# Patient Record
Sex: Female | Born: 1970 | Race: White | Hispanic: No | Marital: Married | Smoking: Former smoker
Health system: Southern US, Community
[De-identification: ages and names within clinical notes are randomized; demographics above are authoritative.]

## PROBLEM LIST (undated history)

## (undated) DIAGNOSIS — E785 Hyperlipidemia, unspecified: Secondary | ICD-10-CM

## (undated) DIAGNOSIS — E119 Type 2 diabetes mellitus without complications: Secondary | ICD-10-CM

## (undated) DIAGNOSIS — T7840XA Allergy, unspecified, initial encounter: Secondary | ICD-10-CM

## (undated) DIAGNOSIS — M1990 Unspecified osteoarthritis, unspecified site: Secondary | ICD-10-CM

## (undated) DIAGNOSIS — G4730 Sleep apnea, unspecified: Secondary | ICD-10-CM

## (undated) DIAGNOSIS — E669 Obesity, unspecified: Secondary | ICD-10-CM

## (undated) DIAGNOSIS — G4733 Obstructive sleep apnea (adult) (pediatric): Secondary | ICD-10-CM

## (undated) DIAGNOSIS — F419 Anxiety disorder, unspecified: Secondary | ICD-10-CM

## (undated) DIAGNOSIS — J45909 Unspecified asthma, uncomplicated: Secondary | ICD-10-CM

## (undated) DIAGNOSIS — N83209 Unspecified ovarian cyst, unspecified side: Secondary | ICD-10-CM

## (undated) DIAGNOSIS — I10 Essential (primary) hypertension: Secondary | ICD-10-CM

## (undated) HISTORY — DX: Allergy, unspecified, initial encounter: T78.40XA

## (undated) HISTORY — DX: Hyperlipidemia, unspecified: E78.5

## (undated) HISTORY — PX: TUBAL LIGATION: SHX77

## (undated) HISTORY — DX: Obesity, unspecified: E66.9

## (undated) HISTORY — DX: Unspecified osteoarthritis, unspecified site: M19.90

## (undated) HISTORY — DX: Essential (primary) hypertension: I10

## (undated) HISTORY — PX: ABDOMINAL HYSTERECTOMY: SHX81

## (undated) HISTORY — DX: Unspecified asthma, uncomplicated: J45.909

## (undated) HISTORY — PX: BREAST EXCISIONAL BIOPSY: SUR124

## (undated) HISTORY — DX: Sleep apnea, unspecified: G47.30

## (undated) HISTORY — PX: OTHER SURGICAL HISTORY: SHX169

## (undated) HISTORY — PX: BREAST SURGERY: SHX581

## (undated) HISTORY — PX: NASAL SINUS SURGERY: SHX719

## (undated) HISTORY — DX: Obstructive sleep apnea (adult) (pediatric): G47.33

## (undated) HISTORY — DX: Unspecified ovarian cyst, unspecified side: N83.209

## (undated) HISTORY — DX: Anxiety disorder, unspecified: F41.9

## (undated) HISTORY — PX: CHOLECYSTECTOMY: SHX55

---

## 1997-09-16 ENCOUNTER — Ambulatory Visit (HOSPITAL_COMMUNITY): Admission: RE | Admit: 1997-09-16 | Discharge: 1997-09-16 | Payer: Self-pay | Admitting: Obstetrics and Gynecology

## 1997-10-12 ENCOUNTER — Other Ambulatory Visit: Admission: RE | Admit: 1997-10-12 | Discharge: 1997-10-12 | Payer: Self-pay | Admitting: Obstetrics and Gynecology

## 1998-02-24 ENCOUNTER — Inpatient Hospital Stay (HOSPITAL_COMMUNITY): Admission: RE | Admit: 1998-02-24 | Discharge: 1998-02-25 | Payer: Self-pay | Admitting: Obstetrics and Gynecology

## 1998-09-14 ENCOUNTER — Other Ambulatory Visit: Admission: RE | Admit: 1998-09-14 | Discharge: 1998-09-14 | Payer: Self-pay | Admitting: Obstetrics and Gynecology

## 2001-03-03 ENCOUNTER — Other Ambulatory Visit: Admission: RE | Admit: 2001-03-03 | Discharge: 2001-03-03 | Payer: Self-pay | Admitting: Gynecology

## 2003-02-24 ENCOUNTER — Emergency Department (HOSPITAL_COMMUNITY): Admission: EM | Admit: 2003-02-24 | Discharge: 2003-02-24 | Payer: Self-pay | Admitting: Emergency Medicine

## 2004-06-11 ENCOUNTER — Emergency Department (HOSPITAL_COMMUNITY): Admission: EM | Admit: 2004-06-11 | Discharge: 2004-06-11 | Payer: Self-pay | Admitting: Family Medicine

## 2006-07-11 ENCOUNTER — Emergency Department (HOSPITAL_COMMUNITY): Admission: EM | Admit: 2006-07-11 | Discharge: 2006-07-11 | Payer: Self-pay | Admitting: Family Medicine

## 2008-07-29 ENCOUNTER — Ambulatory Visit: Payer: Self-pay | Admitting: Family Medicine

## 2008-07-29 DIAGNOSIS — K219 Gastro-esophageal reflux disease without esophagitis: Secondary | ICD-10-CM | POA: Insufficient documentation

## 2008-07-29 DIAGNOSIS — J309 Allergic rhinitis, unspecified: Secondary | ICD-10-CM | POA: Insufficient documentation

## 2008-07-29 DIAGNOSIS — J4520 Mild intermittent asthma, uncomplicated: Secondary | ICD-10-CM | POA: Insufficient documentation

## 2008-07-29 DIAGNOSIS — M2560 Stiffness of unspecified joint, not elsewhere classified: Secondary | ICD-10-CM | POA: Insufficient documentation

## 2008-07-29 DIAGNOSIS — D509 Iron deficiency anemia, unspecified: Secondary | ICD-10-CM | POA: Insufficient documentation

## 2008-08-02 ENCOUNTER — Telehealth: Payer: Self-pay | Admitting: Family Medicine

## 2008-08-12 ENCOUNTER — Ambulatory Visit: Payer: Self-pay | Admitting: Family Medicine

## 2008-08-12 DIAGNOSIS — E7800 Pure hypercholesterolemia, unspecified: Secondary | ICD-10-CM | POA: Insufficient documentation

## 2008-08-12 LAB — CONVERTED CEMR LAB: Anti Nuclear Antibody(ANA): NEGATIVE

## 2008-08-17 LAB — CONVERTED CEMR LAB
AST: 29 units/L (ref 0–37)
Albumin: 3.6 g/dL (ref 3.5–5.2)
Alkaline Phosphatase: 97 units/L (ref 39–117)
BUN: 8 mg/dL (ref 6–23)
Bilirubin, Direct: 0.1 mg/dL (ref 0.0–0.3)
Chloride: 108 meq/L (ref 96–112)
Cholesterol: 166 mg/dL (ref 0–200)
GFR calc non Af Amer: 100 mL/min
Glucose, Bld: 91 mg/dL (ref 70–99)
LDL Cholesterol: 109 mg/dL — ABNORMAL HIGH (ref 0–99)
Potassium: 4.1 meq/L (ref 3.5–5.1)
Sodium: 143 meq/L (ref 135–145)
TSH: 0.91 microintl units/mL (ref 0.35–5.50)

## 2009-03-13 ENCOUNTER — Emergency Department (HOSPITAL_COMMUNITY): Admission: EM | Admit: 2009-03-13 | Discharge: 2009-03-13 | Payer: Self-pay | Admitting: Family Medicine

## 2010-07-24 ENCOUNTER — Inpatient Hospital Stay (INDEPENDENT_AMBULATORY_CARE_PROVIDER_SITE_OTHER)
Admission: RE | Admit: 2010-07-24 | Discharge: 2010-07-24 | Disposition: A | Discharge status: Home / Self Care | Payer: Self-pay | Source: Ambulatory Visit | Attending: Emergency Medicine | Admitting: Emergency Medicine

## 2010-07-24 DIAGNOSIS — S8000XA Contusion of unspecified knee, initial encounter: Secondary | ICD-10-CM

## 2010-07-24 DIAGNOSIS — S335XXA Sprain of ligaments of lumbar spine, initial encounter: Secondary | ICD-10-CM

## 2010-07-24 LAB — POCT URINALYSIS DIPSTICK
Bilirubin Urine: NEGATIVE
Ketones, ur: NEGATIVE mg/dL
Protein, ur: NEGATIVE mg/dL
Specific Gravity, Urine: 1.015 (ref 1.005–1.030)

## 2012-04-10 ENCOUNTER — Other Ambulatory Visit: Payer: Self-pay | Admitting: Nurse Practitioner

## 2012-04-10 DIAGNOSIS — Z1231 Encounter for screening mammogram for malignant neoplasm of breast: Secondary | ICD-10-CM

## 2012-05-20 ENCOUNTER — Ambulatory Visit: Payer: Medicaid Other

## 2012-05-26 ENCOUNTER — Ambulatory Visit
Admission: RE | Admit: 2012-05-26 | Discharge: 2012-05-26 | Disposition: A | Discharge status: Home / Self Care | Payer: No Typology Code available for payment source | Source: Ambulatory Visit | Attending: Nurse Practitioner | Admitting: Nurse Practitioner

## 2012-05-26 DIAGNOSIS — Z1231 Encounter for screening mammogram for malignant neoplasm of breast: Secondary | ICD-10-CM

## 2012-06-02 ENCOUNTER — Other Ambulatory Visit: Payer: Self-pay | Admitting: Nurse Practitioner

## 2012-06-02 DIAGNOSIS — R928 Other abnormal and inconclusive findings on diagnostic imaging of breast: Secondary | ICD-10-CM

## 2012-06-19 ENCOUNTER — Ambulatory Visit
Admission: RE | Admit: 2012-06-19 | Discharge: 2012-06-19 | Disposition: A | Discharge status: Home / Self Care | Payer: Self-pay | Source: Ambulatory Visit | Attending: Nurse Practitioner | Admitting: Nurse Practitioner

## 2012-06-19 DIAGNOSIS — R928 Other abnormal and inconclusive findings on diagnostic imaging of breast: Secondary | ICD-10-CM

## 2012-12-02 ENCOUNTER — Ambulatory Visit (HOSPITAL_BASED_OUTPATIENT_CLINIC_OR_DEPARTMENT_OTHER)
Admission: RE | Admit: 2012-12-02 | Discharge: 2012-12-02 | Disposition: A | Discharge status: Home / Self Care | Payer: BC Managed Care – PPO | Source: Ambulatory Visit | Attending: Internal Medicine | Admitting: Internal Medicine

## 2012-12-02 ENCOUNTER — Ambulatory Visit (INDEPENDENT_AMBULATORY_CARE_PROVIDER_SITE_OTHER): Payer: BC Managed Care – PPO | Admitting: Internal Medicine

## 2012-12-02 ENCOUNTER — Encounter: Payer: Self-pay | Admitting: Internal Medicine

## 2012-12-02 VITALS — BP 128/88 | HR 94 | Temp 98.9°F | Resp 18 | Ht 63.0 in | Wt 268.0 lb

## 2012-12-02 DIAGNOSIS — M25559 Pain in unspecified hip: Secondary | ICD-10-CM | POA: Insufficient documentation

## 2012-12-02 DIAGNOSIS — R42 Dizziness and giddiness: Secondary | ICD-10-CM | POA: Insufficient documentation

## 2012-12-02 DIAGNOSIS — G4733 Obstructive sleep apnea (adult) (pediatric): Secondary | ICD-10-CM

## 2012-12-02 DIAGNOSIS — Z90710 Acquired absence of both cervix and uterus: Secondary | ICD-10-CM | POA: Insufficient documentation

## 2012-12-02 LAB — CBC WITH DIFFERENTIAL/PLATELET
Basophils Absolute: 0 10*3/uL (ref 0.0–0.1)
Eosinophils Relative: 2 % (ref 0–5)
HCT: 40.1 % (ref 36.0–46.0)
Hemoglobin: 13.5 g/dL (ref 12.0–15.0)
Lymphocytes Relative: 34 % (ref 12–46)
Lymphs Abs: 2.7 10*3/uL (ref 0.7–4.0)
MCV: 87.2 fL (ref 78.0–100.0)
Monocytes Absolute: 0.5 10*3/uL (ref 0.1–1.0)
Monocytes Relative: 7 % (ref 3–12)
Neutro Abs: 4.5 10*3/uL (ref 1.7–7.7)
RBC: 4.6 MIL/uL (ref 3.87–5.11)
RDW: 14.3 % (ref 11.5–15.5)
WBC: 7.9 10*3/uL (ref 4.0–10.5)

## 2012-12-02 LAB — COMPREHENSIVE METABOLIC PANEL
AST: 26 U/L (ref 0–37)
Albumin: 4.4 g/dL (ref 3.5–5.2)
BUN: 11 mg/dL (ref 6–23)
CO2: 25 mEq/L (ref 19–32)
Calcium: 9 mg/dL (ref 8.4–10.5)
Chloride: 104 mEq/L (ref 96–112)
Creat: 0.74 mg/dL (ref 0.50–1.10)
Glucose, Bld: 80 mg/dL (ref 70–99)
Potassium: 4.2 mEq/L (ref 3.5–5.3)

## 2012-12-02 LAB — T4, FREE: Free T4: 1.02 ng/dL (ref 0.80–1.80)

## 2012-12-02 LAB — LIPID PANEL
Cholesterol: 176 mg/dL (ref 0–200)
HDL: 52 mg/dL (ref >39)
Total CHOL/HDL Ratio: 3.4 Ratio

## 2012-12-02 LAB — T3, FREE: T3, Free: 3.1 pg/mL (ref 2.3–4.2)

## 2012-12-02 NOTE — Progress Notes (Signed)
Subjective:    Patient ID: Jody Jensen, female    DOB: 01-Jun-1971, 42 y.o.   MRN: [id number]  HPI New pt here for first visit.  Here with her mother.   PMH of morbid obesity,m  OSA,  Anxiety on celexa,  .  She smokes vapor cigarettes.    Former care Tomi Bamberger NP and Kerby Nora.  [place] is concerned over fatigue "no enery"  And weight gain .  She also reports that sh had episode of "lightheadedness" 2 weeks ago while at work.  No palpitations no chest pain or pressure  No syncope.  "my back was killing me"  Lightheadedness has been chronic .  Most recent prior episode 3 weeks before.  Has been going on for months.    Also has chronic thoracic,lumbar and hip pain.  Has not had any imaging.    No Known Allergies Past Medical History  Diagnosis Date  . Sleep apnea   . Anxiety   . Ovarian cyst    Past Surgical History  Procedure Laterality Date  . Cesarean section    . Nasal sinus surgery    . Tubal ligation    . Bone spur Right   . Breast surgery      benign breast cyst removal  . Ovarian cyst removal     History   Social History  . Marital Status: Married    Spouse Name: N/A    Number of Children: N/A  . Years of Education: N/A   Occupational History  . Not on file.   Social History Main Topics  . Smoking status: Former Smoker    Types: Cigarettes    Quit date: 02/03/2012  . Smokeless tobacco: Never Used  . Alcohol Use: No  . Drug Use: No  . Sexually Active: Yes    Birth Control/ Protection: Surgical   Other Topics Concern  . Not on file   Social History Narrative  . No narrative on file   Family History  Problem Relation Age of Onset  . Hypertension Mother   . Hyperlipidemia Mother   . Heart disease Mother   . Diabetes Mother   . Asthma Mother   . Cancer Paternal Grandmother     cervical  . Diabetes Paternal Grandfather   . Stroke Paternal Grandfather    Patient Active Problem List   Diagnosis Date Noted  . OSA (obstructive sleep apnea)  12/02/2012  . Morbid obesity 12/02/2012  . S/P hysterectomy 12/02/2012  . Episodic lightheadedness 12/02/2012  . Hip pain 12/02/2012  . Lightheadedness 12/02/2012  . PURE HYPERCHOLESTEROLEMIA 08/12/2008  . OBESITY 07/29/2008  . ANEMIA-IRON DEFICIENCY 07/29/2008  . ALLERGIC RHINITIS 07/29/2008  . ASTHMA 07/29/2008  . GERD 07/29/2008  . JOINT STIFFNESS 07/29/2008   No current outpatient prescriptions on file prior to visit.   No current facility-administered medications on file prior to visit.       Review of Systems See HPI     Objective:   Physical Exam  Physical Exam  Nursing note and vitals reviewed.  Constitutional: She is oriented to person, place, and time. She appears well-developed and well-nourished.  HENT:  Head: Normocephalic and atraumatic.  Cardiovascular: Normal rate and regular rhythm. Exam reveals no gallop and no friction rub.  No murmur heard.  Pulmonary/Chest: Breath sounds normal. She has no wheezes. She has no rales.  Neurological: She is alert and oriented to person, place, and time.  Skin: Skin is warm and dry.  Psychiatric: She has  a normal mood and affect. Her behavior is normal.        Assessment & Plan:  Lightheadedness  EKG today No acute changes  SR   TWI V1  .  Etiology unclear  Hip pain  Will get imaging today  Back pain  Will give Relafen 500 mg bid with food  Morbid obesity  Check TSH

## 2012-12-03 ENCOUNTER — Encounter: Payer: Self-pay | Admitting: Internal Medicine

## 2012-12-03 ENCOUNTER — Telehealth: Payer: Self-pay | Admitting: Internal Medicine

## 2012-12-03 DIAGNOSIS — M549 Dorsalgia, unspecified: Secondary | ICD-10-CM

## 2012-12-03 LAB — VITAMIN D 25 HYDROXY (VIT D DEFICIENCY, FRACTURES): Vit D, 25-Hydroxy: 22 ng/mL — ABNORMAL LOW (ref 30–89)

## 2012-12-03 MED ORDER — NABUMETONE 500 MG PO TABS: 500.0000 mg | ORAL_TABLET | Freq: Two times a day (BID) | ORAL | Status: DC

## 2012-12-03 NOTE — Telephone Encounter (Signed)
Spoke with pt and informed of xray and lab results    Advised to take 2000 units of vitamin D daily  /will refer to D.r [facility] and D.r Medoff

## 2012-12-23 ENCOUNTER — Ambulatory Visit (HOSPITAL_BASED_OUTPATIENT_CLINIC_OR_DEPARTMENT_OTHER)
Admission: RE | Admit: 2012-12-23 | Discharge: 2012-12-23 | Disposition: A | Discharge status: Home / Self Care | Payer: BC Managed Care – PPO | Source: Ambulatory Visit | Attending: Internal Medicine | Admitting: Internal Medicine

## 2012-12-23 ENCOUNTER — Ambulatory Visit (INDEPENDENT_AMBULATORY_CARE_PROVIDER_SITE_OTHER): Payer: BC Managed Care – PPO | Admitting: Internal Medicine

## 2012-12-23 ENCOUNTER — Encounter: Payer: Self-pay | Admitting: Internal Medicine

## 2012-12-23 VITALS — BP 125/81 | HR 81 | Temp 98.6°F | Resp 18 | Wt 271.0 lb

## 2012-12-23 DIAGNOSIS — R05 Cough: Secondary | ICD-10-CM

## 2012-12-23 DIAGNOSIS — R059 Cough, unspecified: Secondary | ICD-10-CM

## 2012-12-23 DIAGNOSIS — R062 Wheezing: Secondary | ICD-10-CM | POA: Insufficient documentation

## 2012-12-23 DIAGNOSIS — R7401 Elevation of levels of liver transaminase levels: Secondary | ICD-10-CM

## 2012-12-23 DIAGNOSIS — E7800 Pure hypercholesterolemia, unspecified: Secondary | ICD-10-CM

## 2012-12-23 DIAGNOSIS — J45909 Unspecified asthma, uncomplicated: Secondary | ICD-10-CM

## 2012-12-23 DIAGNOSIS — R7402 Elevation of levels of lactic acid dehydrogenase (LDH): Secondary | ICD-10-CM

## 2012-12-23 DIAGNOSIS — R635 Abnormal weight gain: Secondary | ICD-10-CM

## 2012-12-23 DIAGNOSIS — R0602 Shortness of breath: Secondary | ICD-10-CM | POA: Insufficient documentation

## 2012-12-23 MED ORDER — ALBUTEROL SULFATE HFA 108 (90 BASE) MCG/ACT IN AERS: 2.0000 | INHALATION_SPRAY | Freq: Four times a day (QID) | RESPIRATORY_TRACT | Status: DC | PRN

## 2012-12-24 ENCOUNTER — Telehealth: Payer: Self-pay | Admitting: *Deleted

## 2012-12-24 DIAGNOSIS — R7401 Elevation of levels of liver transaminase levels: Secondary | ICD-10-CM | POA: Insufficient documentation

## 2012-12-24 NOTE — Addendum Note (Signed)
Addended by: Raechel Chute D on: 12/24/2012 11:29 AM   Modules accepted: Level of Service

## 2012-12-24 NOTE — Telephone Encounter (Signed)
Message copied by Mathews Robinsons on Thu Dec 24, 2012 12:26 PM ------      Message from: Jody Jensen      Created: Thu Dec 24, 2012 11:30 AM       Karen Kitchens            Call [place] and let her know that her CXR is OK            Give her a follow up appt in 4 weeks to see me about her wheezing.  She did not make a follow up after I saw her yesterday ------

## 2012-12-24 NOTE — Telephone Encounter (Signed)
Notified pt of -chest xray and follow up appt made for 8/27

## 2012-12-24 NOTE — Progress Notes (Addendum)
Subjective:    Patient ID: Jody Jensen, female    DOB: 1971/06/01, 42 y.o.   MRN: [id number]  HPI  Jody Jensen is here for follow up.    She has been using RElafen for her joint and back pain.  She has not seen Dr Charlett Blake as yet.    I reviewed all of her labs with her  Alt is slightly elevated and she reports this is not new for her.  In fact 4 years ago I not that her Alt was 55  AST is normal   Lipids acceptable LDL 104    throid normal  She is concerned about two things today.  She has had some wheezing recently and notices when walking long distances.  She does have a history of asthma.  She does not have any inhalers  She is very concerned aabout her weight.  Could not afford to see Dr. Kinnie Scales .  She reallyd does not want an appetite suppressant as she tells me she really does not eat much  .  She declines appt to see a nutritionist.  BMI is 48  She is wondering if it is her horomones and would like to see an endocrine specialist  No Known Allergies Past Medical History  Diagnosis Date  . Sleep apnea   . Anxiety   . Ovarian cyst    Past Surgical History  Procedure Laterality Date  . Cesarean section    . Nasal sinus surgery    . Tubal ligation    . Bone spur Right   . Breast surgery      benign breast cyst removal  . Ovarian cyst removal     History   Social History  . Marital Status: Married    Spouse Name: N/A    Number of Children: N/A  . Years of Education: N/A   Occupational History  . Not on file.   Social History Main Topics  . Smoking status: Former Smoker    Types: Cigarettes    Quit date: 02/03/2012  . Smokeless tobacco: Never Used  . Alcohol Use: No  . Drug Use: No  . Sexually Active: Yes    Birth Control/ Protection: Surgical   Other Topics Concern  . Not on file   Social History Narrative  . No narrative on file   Family History  Problem Relation Age of Onset  . Hypertension Mother   . Hyperlipidemia Mother   . Heart disease Mother   .  Diabetes Mother   . Asthma Mother   . Cancer Paternal Grandmother     cervical  . Diabetes Paternal Grandfather   . Stroke Paternal Grandfather    Patient Active Problem List   Diagnosis Date Noted  . OSA (obstructive sleep apnea) 12/02/2012  . Morbid obesity 12/02/2012  . S/P hysterectomy 12/02/2012  . Episodic lightheadedness 12/02/2012  . Hip pain 12/02/2012  . Lightheadedness 12/02/2012  . PURE HYPERCHOLESTEROLEMIA 08/12/2008  . OBESITY 07/29/2008  . ANEMIA-IRON DEFICIENCY 07/29/2008  . ALLERGIC RHINITIS 07/29/2008  . ASTHMA 07/29/2008  . GERD 07/29/2008  . JOINT STIFFNESS 07/29/2008   Current Outpatient Prescriptions on File Prior to Visit  Medication Sig Dispense Refill  . citalopram (CELEXA) 20 MG tablet Take 20 mg by mouth daily.      . nabumetone (RELAFEN) 500 MG tablet Take 1 tablet (500 mg total) by mouth 2 (two) times daily.  60 tablet  1   No current facility-administered medications on file prior to visit.  Review of Systems See HPI    Objective:   Physical Exam Physical Exam  Nursing note and vitals reviewed.   Peak flow 250 in office Constitutional: She is oriented to person, place, and time. She appears well-developed and well-nourished.  HENT:  Head: Normocephalic and atraumatic.  Cardiovascular: Normal rate and regular rhythm. Exam reveals no gallop and no friction rub.  No murmur heard.  Pulmonary/Chest: Breath sounds normal. She has no wheezes to deep I/E in the office. She has no rales.  Neurological: She is alert and oriented to person, place, and time.  Skin: Skin is warm and dry.  Psychiatric: She has a normal mood and affect. Her behavior is normal.         Assessment & Plan:  Asthma/ bronchospasm  Will get CXR today. will give Albuterol inhaler instructed to use bid for now.  If using more than 3 times daily she is to see me in office.  She voices understanding  Morbid obesity  Discussed  appetitie suppressant options but she  really is not interested in this.  Thryoid is normal She would like to see an endocrinologist.  Will refer  Historyof anemia  Normal hgb now  Elevated ALT  Value lower now.  Will moniter for now

## 2013-01-27 ENCOUNTER — Ambulatory Visit: Payer: BC Managed Care – PPO | Admitting: Internal Medicine

## 2013-01-29 ENCOUNTER — Encounter: Payer: Self-pay | Admitting: Internal Medicine

## 2013-01-29 ENCOUNTER — Ambulatory Visit (INDEPENDENT_AMBULATORY_CARE_PROVIDER_SITE_OTHER): Payer: BC Managed Care – PPO | Admitting: Internal Medicine

## 2013-01-29 LAB — FOLLICLE STIMULATING HORMONE: FSH: 15.2 m[IU]/mL

## 2013-01-29 LAB — HEMOGLOBIN A1C: Hgb A1c MFr Bld: 5.8 % (ref 4.6–6.5)

## 2013-01-29 NOTE — Progress Notes (Signed)
Subjective:     Patient ID: Jody Jensen, female   DOB: 1971-01-31, 42 y.o.   MRN: [id number]  HPI Jody Jensen is a 42 year old woman, referred by her PCP, Dr. Constance Goltz, for evaluation for endocrine causes for obesity. She is here with her mother who offers part of the history and asks Qs.  Reviewed weight hx: Vitals - 1 value per visit 01/29/2013 12/23/2012 12/02/2012 07/29/2008  Weight (lb) 269 271 268 227.13  Height 5' 4.5"  5\' 3"  5' 4.5"  BMI 45.48 48.02 47.49 38.4  She has gained approximately 40 pounds in the last 4 years. Per pt, she started to gain a lot of weight last year: 40-50 lbs. She also has hot flushes, had hysterectomy at 27, no BSO.   She saw her PCP on 12/23/2012. At that time, her BMI was 48. They discussed about appetite suppressants but she mentioned that she did not eat much and declined an appointment with nutrition. She wanted a referral to endocrinology to see for hormones are normal.  He does not have thyroid disorder, latest TSH and free T4 were: Lab Results  Component Value Date   TSH 1.193 12/02/2012   TSH 0.91 08/12/2008   FREET4 1.02 12/02/2012   He does have complications from her obesity: GERD, obstructive sleep apnea, L>R hip, back pain, joint stiffness, hyperlipidemia, high ALT - ?fatty liver, and possibly asthma.  She also has: - fatigue - am or pm; she sleeps well at night, works daytime - fluid retention:  - worse towards the end of the day - no weakness - more acne lately - no hair loss or hirsutism - No new stretch marks - occasional constipation/diarrhea - no depression or anxiety - on Celexa for 2 year - no steroids, no herbal meds - no DM, HTN, OSA  - not severe enough to require CPAP - but Sleep study was 15 years ago  - weight issues in mother and father (he had a GBP) - she is not opposed to weight loss meds  Meals: B'fast: bagel+ cream cheese, yoghurt, sometimes Weight watchers: english muffin + canadian bacon Lunch: sandwich or salad +  dressing  Dinner: grilled chicken or other meat + vegetables + while potatoes/rice/bread Snacks: 1-2: yoghurt, peanuts Has 2-3 sodas a day, now regular, on diet sodas before regular.   Review of Systems Constitutional: no weight gain/loss, no fatigue, no subjective hyperthermia/hypothermia; + excessive urination and nocturia (always); + hot flushes - also see HPI Eyes: no blurry vision, no xerophthalmia ENT: no sore throat, no nodules palpated in throat, no dysphagia/odynophagia, no hoarseness; + tinnitus Cardiovascular: no CP/SOB/palpitations/+ leg swelling Respiratory: no cough/SOB Gastrointestinal: no N/V/D/+ C, + acid reflux Musculoskeletal: + muscle/+ joint aches (knees, back) Skin: no rashes Neurological: no tremors/numbness/tingling/dizziness Psychiatric: no depression/anxiety  Past Medical History  Diagnosis Date  . Sleep apnea   . Anxiety   . Ovarian cyst    Past Surgical History  Procedure Laterality Date  . Cesarean section  1994  . Nasal sinus surgery    . Tubal ligation  1999  . Bone spur Right 2002  . Breast surgery      benign breast cyst removal  . Ovarian cyst removal    Hysterectomy (TAH only) 1999 Cholecystectomy 1989  History   Social History  . Marital Status: Married    Spouse Name: N/A    Number of Children: 2: 87 and 9 y/o   Occupational History  . 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 Smoking status: Former Smoker    Types: Cigarettes    Quit date: 02/03/2012  . Smokeless tobacco: Never Used  . Alcohol Use: No  . Drug Use: No  . Sexual Activity: Yes    Birth Control/ Protection: Surgical   Current Outpatient Prescriptions on File Prior to Visit  Medication Sig Dispense Refill  . albuterol (PROVENTIL HFA;VENTOLIN HFA) 108 (90 BASE) MCG/ACT inhaler Inhale 2 puffs into the lungs every 6 (six) hours as needed for wheezing.  1 Inhaler  0  . citalopram (CELEXA) 20 MG tablet Take 20 mg by mouth daily.      .  nabumetone (RELAFEN) 500 MG tablet Take 1 tablet (500 mg total) by mouth 2 (two) times daily.  60 tablet  1   No current facility-administered medications on file prior to visit.   No Known Allergies  Family History  Problem Relation Age of Onset  . Hypertension Mother   . Hyperlipidemia Mother   . Heart disease Mother   . Diabetes Mother   . Asthma Mother   . Cancer Paternal Grandmother     cervical  . Diabetes Paternal Grandfather   . Stroke Paternal Grandfather    Objective:   Physical Exam BP 122/72  Pulse 87  Temp(Src) 98.7 F (37.1 C) (Oral)  Resp 12  Ht 5' 4.5" (1.638 m)  Wt 269 lb (122.018 kg)  BMI 45.48 kg/m2  SpO2 96%  LMP 11/02/1997 Wt Readings from Last 3 Encounters:  01/29/13 269 lb (122.018 kg)  12/23/12 271 lb (122.925 kg)  12/02/12 268 lb (121.564 kg)   Constitutional: obese, in NAD, no full [place] fat pads, + ruddy complexion Eyes: PERRLA, EOMI, no exophthalmos ENT: moist mucous membranes, no thyromegaly, no cervical lymphadenopathy Cardiovascular: RRR, No MRG, bilat. Leg swelling, non pitting Respiratory: CTA B Gastrointestinal: abdomen soft, NT, ND, BS+ Musculoskeletal: no deformities, strength intact in all 4 Skin: moist, warm, acne spots on face, neck. No hirsutism.  Neurological: no tremor with outstretched hands, DTR normal in all 4    Assessment:     Obesity - class 3     Plan:     1. Obesity - this is a discussion with both mother and daughter, as mother was also obese (and diabetic) and had many questions about dieting and losing weight - Discussed diet in detail, discussing each of her food choices and advised for alternatives (also given a list of healthy substitutions, the patient instructions section). Given specific examples about healthy breakfast, lunch, dinner and snack choices. - Advised to stop drinking sodas of any kind. Substitute with fresh fruit-flavored water. - Advised to eat low glycemic index foods. Avoid highly-processed  sugars.  - Discussed at length about the benefits of a diet with less meat, with probably the best being a vegan one, but the Mediterranean diet being the next best one. Given instructions about materials to use for the plant-based diet (please see patient instructions) - Discussed about benefits and side effects of gastric bypass and weight loss medicines. I advised them that until they can change the way they think about food and make this a way of life rather than a diet, it would be very difficult to lose weight and especially maintain it down - Discussed about exercise and importance of getting any level of activity during the day, even walking. - Advised to get My Fitness Pal application, as patient and her mom was wondering about the number of calories may need  to use to lose weight - we discussed about endocrine causes for obesity, to include hypothyroidism (thyroid test were normal recently), Cushing disease (we will test for this by a 24-hour urine cortisol level), and menopause (she had hysterectomy 15 years ago, in patients that have undergone hysterectomy are more likely to and her menopause earlier despite preservation of the ovaries during surgery - we'll check LH, FSH, estrogen).  - since she is obese and has a FH of DM in mother and father and also grandparents, will check a HbA1c - advised to get another sleep study to see if OSA worse now that she gained so much weight >> CPAP will likely help her fatigue and fluid retention - I will see the patient back in 3 months for recheck, but I encouraged her to join my chart and let the know about her progress with the diet or any questions that she might have. We set a target of 10 pounds lost until next visit in 3 months.  Office Visit on 01/29/2013  Component Date Value Range Status  . Hemoglobin A1C 01/29/2013 5.8  4.6 - 6.5 % Final   Glycemic Control Guidelines for People with Diabetes:Non Diabetic:  <6%Goal of Therapy: <7%Additional  Action Suggested:  >8%   . [facility] 01/29/2013 15.2   Final   Female Reference Range:  1.4-18.1 mIU/mLFemale Reference Range:Follicular Phase          2.5-10.2 mIU/mLMidCycle Peak          3.4-33.4 mIU/mLLuteal Phase          1.5-9.1 mIU/mLPost Menopausal     23.0-116.3 mIU/mLPregnant          <0.3 mIU/mL  . LH 01/29/2013 4.91   Final   Comment: Female Reference Range:20-70 yrs     1.5-9.3 mIU/mL>70 yrs       3.1-35.6 mIU/mLFemale Reference Range:Follicular Phase     1.9-12.5 mIU/mLMidcycle             8.7-76.3 mIU/mLLuteal Phase         0.5-16.9 mIU/mL  Post Menopausal      15.9-54.0                           mIU/mLPregnant             <1.5 mIU/mLContraceptives       0.7-5.6 mIU/mL  . Estradiol, Free 01/29/2013 0.80   Final   Comment: FEMALE REFERENCE RANGES FOR ESTRADIOL, FREE:                            Follicular Stage:  0.43-5.03 pg/mL                            Mid-cycle Stage:   0.72-5.89 pg/mL                            Luteal Stage:      0.40-5.55 pg/mL                            Postmenopausal:    < or = 0.38 pg/mL  . Estradiol 01/29/2013 42   Final   Comment: FEMALE REFERENCE RANGES FOR ESTRADIOL:  Follicular Stage:  39-375 pg/mL                            Mid-cycle Stage:   94-762 pg/mL                            Luteal Stage:      48-440 pg/mL                            Postmenopausal:    < or = 10 pg/mL  . Results Received 01/29/2013 02/07/13   Final   Comment: Reference lab accession: [id number]                          Test performed by:                                     [facility]                                     [address] [place], [place] [address]                                     Phone:  [phone]                          Director:  Pat Patrick, M.D.   Labs not indicative of menopause.   02/11/2013; [facility] as they did not result urine volume.  - urine volume:  2L - cortisol: 7.55 ug/L >> 24h UFC: 15 ug/24h, which is normal (<45). Will let pt know.

## 2013-01-29 NOTE — Patient Instructions (Addendum)
Please consider a plant based diet. The mediterranean diet is the next best thing.  Please consider the following ways to cut down carbs and fat and increase fiber and micronutrients in your diet:  - substitute whole grain for white bread or pasta - substitute brown rice for white rice - substitute 90-calorie flat bread pieces for slices of bread when possible - substitute sweet potatoes or yams for white potatoes - substitute humus for margarine - substitute tofu for cheese when possible - substitute almond or rice milk for regular milk (would not drink soy milk daily due to concern for soy estrogen influence on breast cancer risk) - substitute dark chocolate for other sweets when possible - substitute water - can add lemon or orange slices for taste - for diet sodas (artificial sweeteners will trick your body that you can eat sweets without getting calories and will lead you to overeating and weight gain in the long run) - do not skip breakfast or other meals (this will slow down the metabolism and will result in more weight gain over time)  - can try smoothies made from fruit and almond/rice milk in am instead of regular breakfast - can also try old-fashioned (not instant) oatmeal made with almond/rice milk in am - order the dressing on the side when eating salad at a restaurant (pour less than half of the dressing on the salad) - eat as little meat as possible - can try juicing, but should not forget that juicing will get rid of the fiber, so would alternate with eating raw veg./fruits or drinking smoothies - use as little oil as possible, even when using olive oil - can dress a salad with a mix of balsamic vinegar and lemon juice, for e.g. - use agave nectar, stevia sugar, or regular sugar rather than artificial sweateners - steam or broil/roast veggies  - snack on veggies/fruit/nuts (unsalted, preferably) when possible, rather than processed foods - reduce or eliminate aspartame in diet  (it is in diet sodas, chewing gum, etc) Read the labels!  Try to read Dr. Katherina Right book: "Program for Reversing Diabetes" for the vegan concept and other ideas for healthy eating.  Plant-based diet materials: - Lectures (you tube):  Lequita Asal: "Breaking the Food Seduction"  Doug Lisle: "How to Lose Weight, without Losing Your Mind"  Lucile Crater: "What is Insulin Resistance" [employer]ur.si - Documentaries:  Supersize Me  [employer]  Forks over [employer], Sick and Nearly Dead  [employer] of the [employer] - Books:  Lequita Asal: "Program for Reversing Diabetes"  Ferol Luz: "The [place] Study"  Konrad Penta: "Supermarket Vegan" (cookbook) - Facebook pages:   Reece Agar versus Knives  Vegucated  [employer] Magazine  Food Matters - Healthy nutrition info websites:  LateTelevision.com.ee  Please return in 3 months for reevaluation. Let's set a goal of 10 lbs left until then. Please join MyChart and let me know about your progress with the diet or if you have any questions.  Please collect a 24h urine jug and bring it back after the weekend. Please stop at the lab today. I will let you know the results through MyChart.

## 2013-02-02 ENCOUNTER — Other Ambulatory Visit: Payer: BC Managed Care – PPO

## 2013-02-03 LAB — CREATININE, URINE, 24 HOUR: Creatinine, 24H Ur: 1774 mg/d (ref 700–1800)

## 2013-02-04 ENCOUNTER — Telehealth: Payer: Self-pay | Admitting: Internal Medicine

## 2013-02-04 NOTE — Telephone Encounter (Signed)
Please read note below and advise.  

## 2013-02-04 NOTE — Telephone Encounter (Signed)
Called pt and advised per Dr Elvera Lennox that she can only interpret them when all results are back, she said she will send the results with explanation then. Apologized for that and pt understood. Advised Dr Elvera Lennox would let her know as soon as the rest of the labs come in.

## 2013-02-04 NOTE — Telephone Encounter (Signed)
Jody Jensen, not all labs back >> I can only interpret them when all results are back, so I will send the results with explanation then.

## 2013-02-04 NOTE — Telephone Encounter (Signed)
Call regarding lab results released to MyChart, pt has questions. Call at work thru 3pm or cell / Sherri S.

## 2013-02-07 LAB — ESTRADIOL, FREE: Estradiol, Free: 0.8 pg/mL

## 2013-02-08 ENCOUNTER — Telehealth: Payer: Self-pay | Admitting: Internal Medicine

## 2013-02-08 NOTE — Telephone Encounter (Signed)
Sent msg through MyChart.

## 2013-02-08 NOTE — Telephone Encounter (Signed)
Pt called requesting test results. Please advise

## 2013-02-09 LAB — CORTISOL, URINE, FREE

## 2013-04-08 ENCOUNTER — Other Ambulatory Visit: Payer: Self-pay

## 2013-04-14 ENCOUNTER — Encounter: Payer: BC Managed Care – PPO | Admitting: Internal Medicine

## 2014-04-04 ENCOUNTER — Encounter: Payer: Self-pay | Admitting: Internal Medicine

## 2014-12-10 IMAGING — CR DG HIP W/ PELVIS BILAT
5 series · 5 of 5 positions shown · non-contrast
Comparison: None.

CLINICAL DATA: Bilateral hip pain.

BILATERAL HIP WITH PELVIS - 4+ VIEW

[t pelvis a.p.]
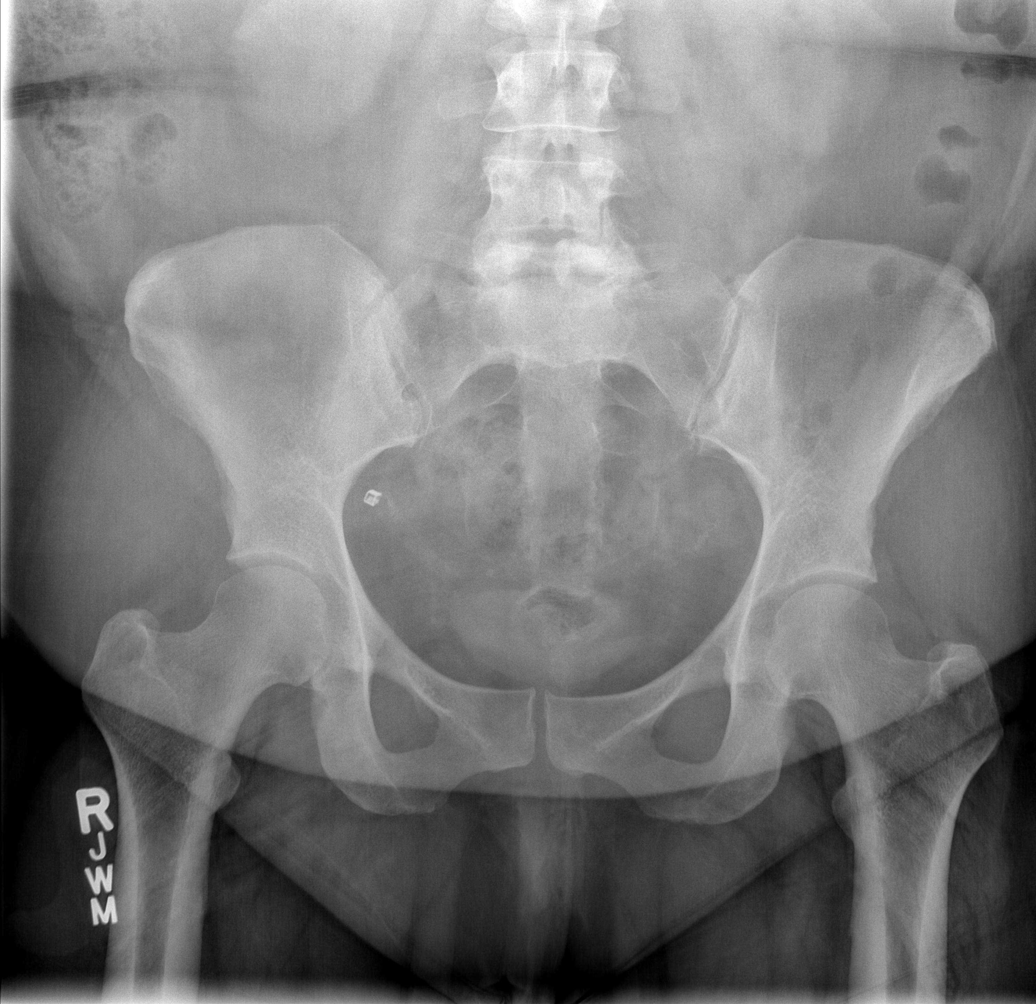

[t hip ap left]
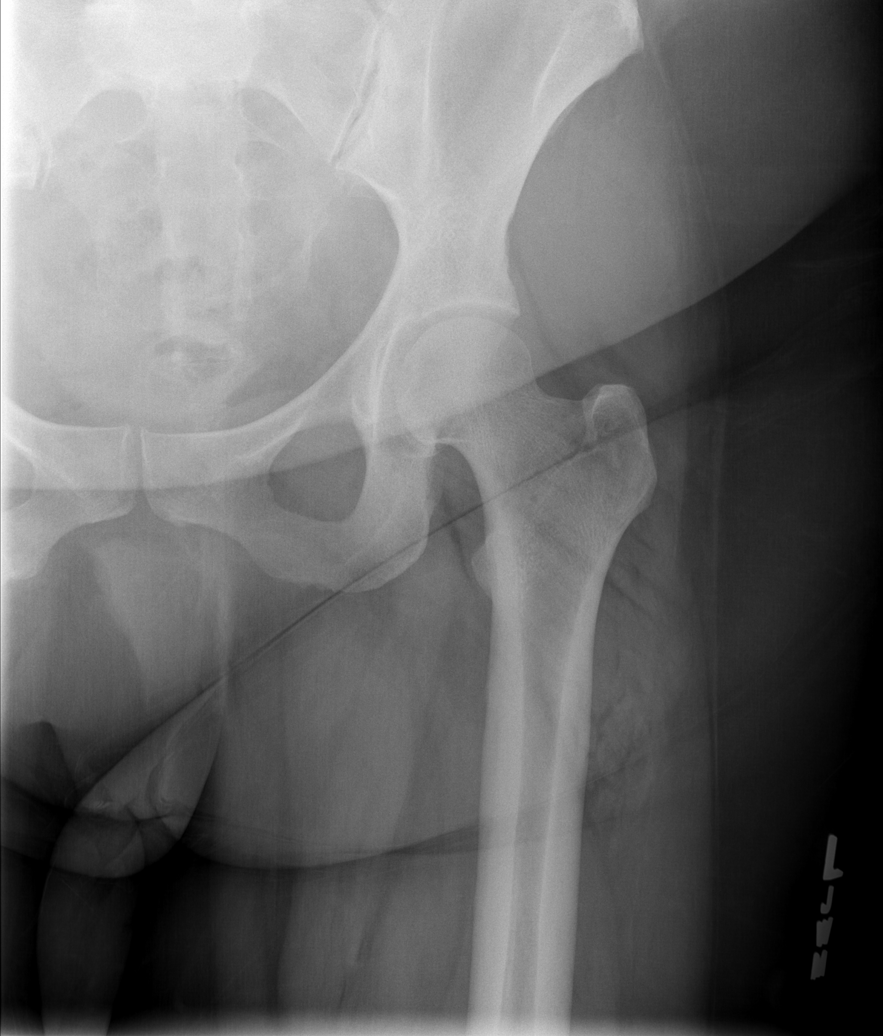

[t hip frog leg left]
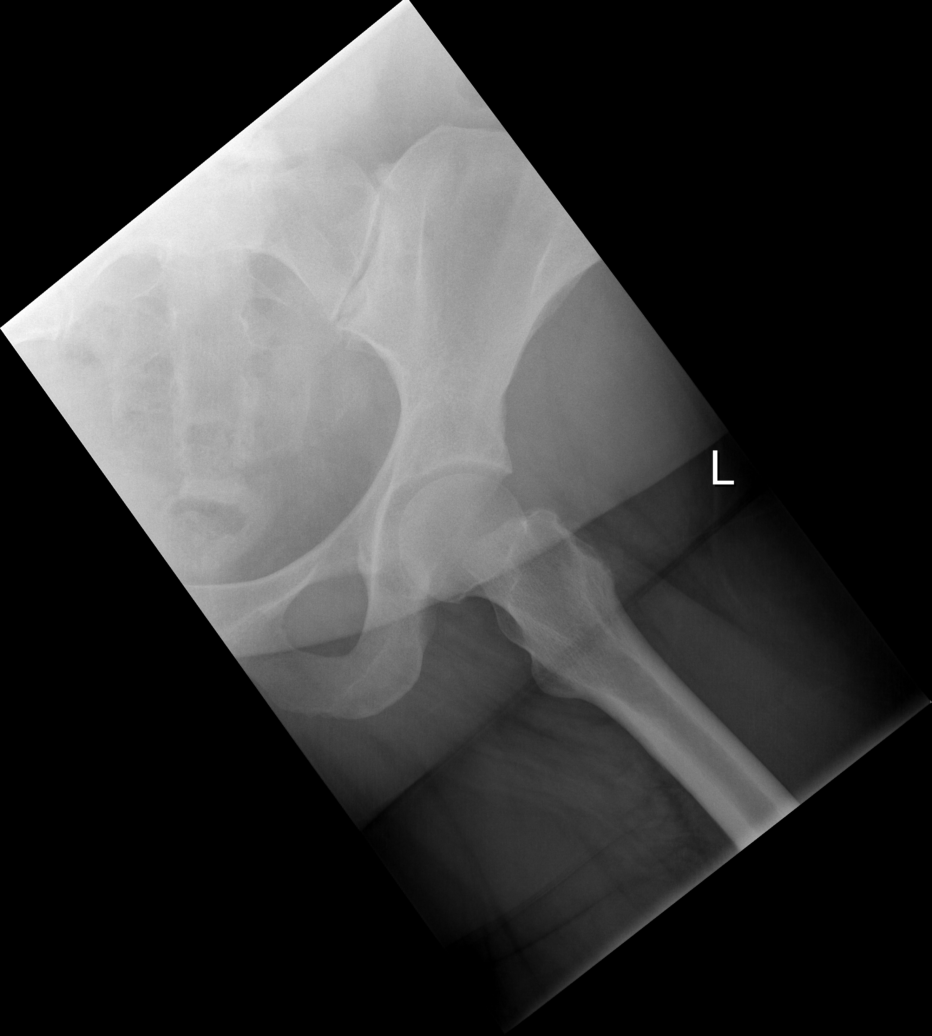

[t hip ap right]
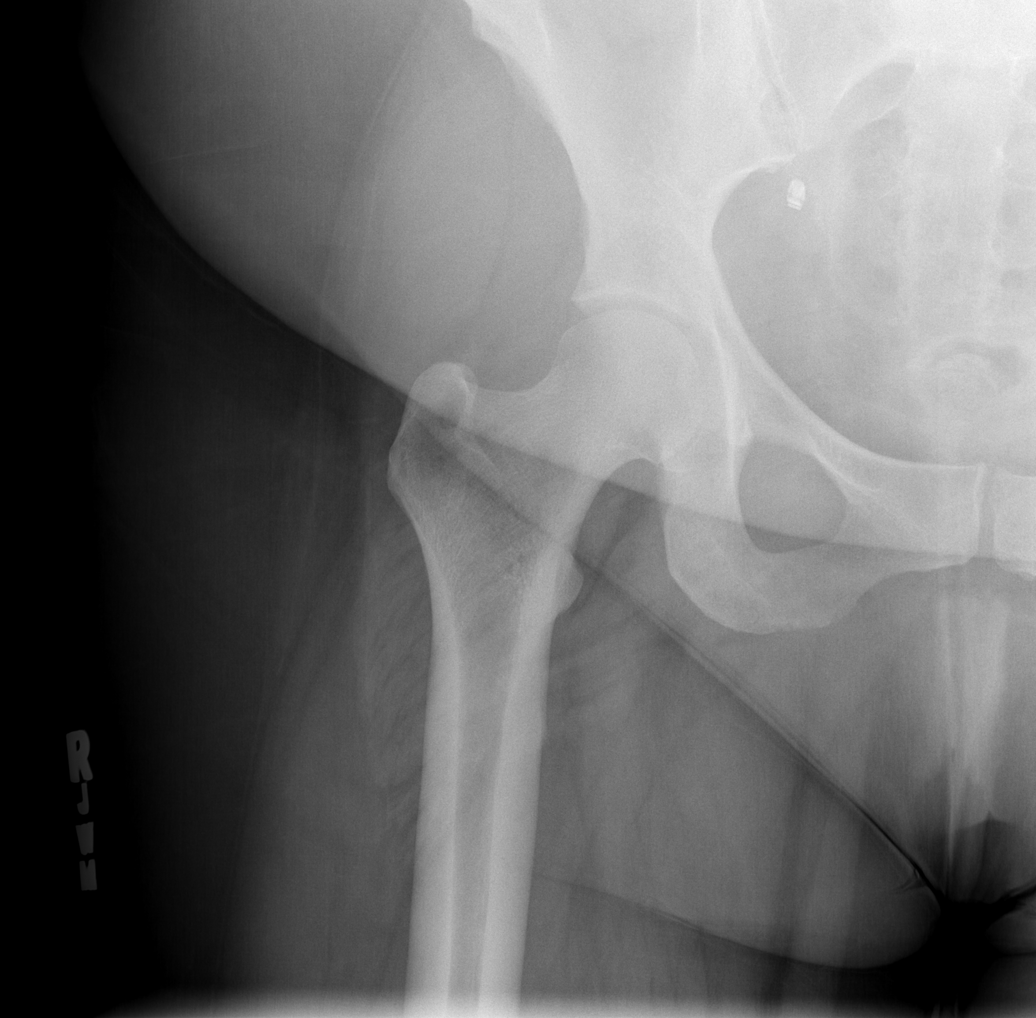

[t hip frog leg right]
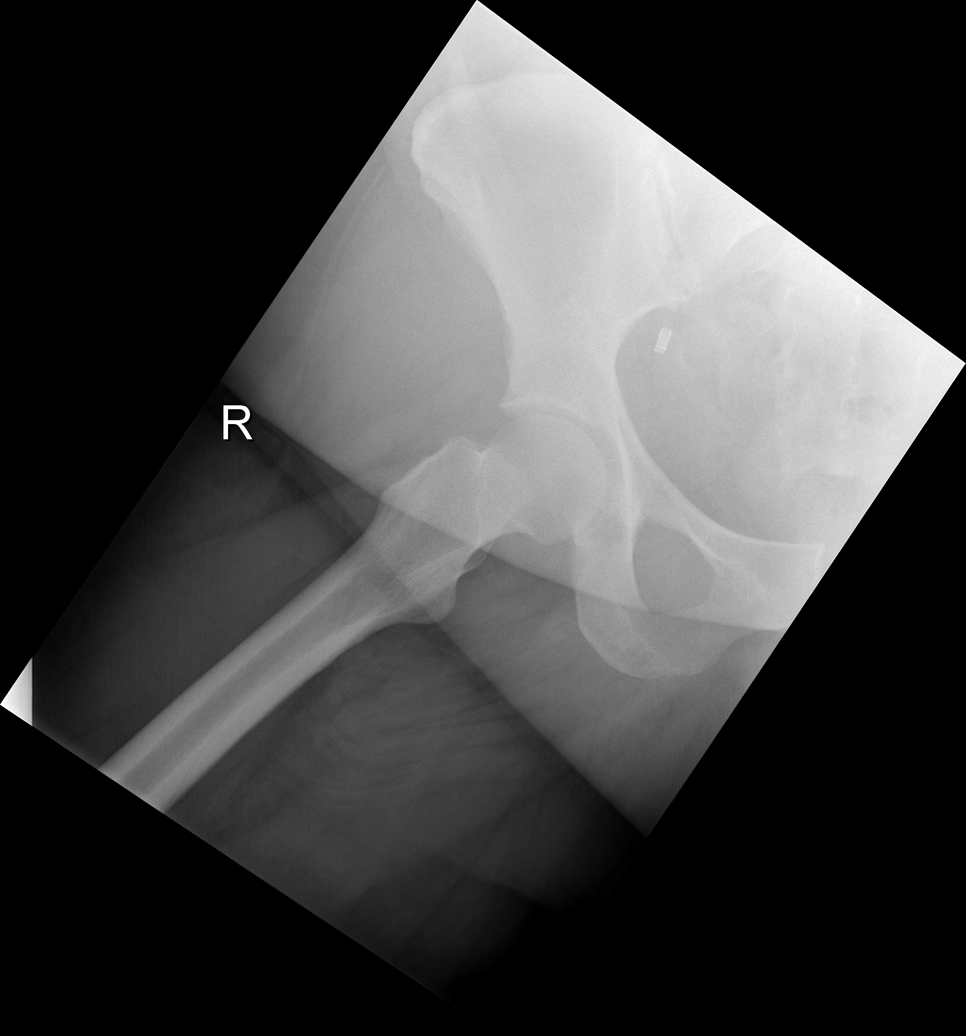

[5 of 5 positions shown; findings below may reference images not displayed]

FINDINGS: Hip joint space is maintained bilaterally.  No
degenerative changes in the hips.
IMPRESSION: No acute findings.  No degenerative changes in the hips.

## 2015-05-04 ENCOUNTER — Telehealth: Payer: Self-pay | Admitting: Internal Medicine

## 2015-05-04 NOTE — Telephone Encounter (Signed)
Called and spoke with pt at length regarding the appointment that was made on 05/02/15 in error for 05/05/15 Dr. Ermalene SearingBedsole for back pain.  [facility] however after checking [facility], pt was established with Dr. Ermalene SearingBedsole and seen once in 2010.  Pt able to accept new appointment at 9:00 am 05/05/15 [facility] and be seen for back pain.

## 2015-05-05 ENCOUNTER — Encounter: Payer: Self-pay | Admitting: Family Medicine

## 2015-05-05 ENCOUNTER — Ambulatory Visit: Payer: Self-pay | Admitting: Family Medicine

## 2015-05-05 ENCOUNTER — Ambulatory Visit (INDEPENDENT_AMBULATORY_CARE_PROVIDER_SITE_OTHER): Payer: 59 | Admitting: Family Medicine

## 2015-05-05 VITALS — BP 112/82 | HR 75 | Temp 98.4°F | Ht 63.5 in | Wt 271.0 lb

## 2015-05-05 DIAGNOSIS — G4733 Obstructive sleep apnea (adult) (pediatric): Secondary | ICD-10-CM | POA: Diagnosis not present

## 2015-05-05 DIAGNOSIS — J4520 Mild intermittent asthma, uncomplicated: Secondary | ICD-10-CM

## 2015-05-05 DIAGNOSIS — M5441 Lumbago with sciatica, right side: Secondary | ICD-10-CM

## 2015-05-05 DIAGNOSIS — E7800 Pure hypercholesterolemia, unspecified: Secondary | ICD-10-CM

## 2015-05-05 MED ORDER — DICLOFENAC SODIUM 75 MG PO TBEC: 75.0000 mg | DELAYED_RELEASE_TABLET | Freq: Two times a day (BID) | ORAL | Status: DC

## 2015-05-05 MED ORDER — CYCLOBENZAPRINE HCL 10 MG PO TABS: 10.0000 mg | ORAL_TABLET | Freq: Every evening | ORAL | Status: DC | PRN

## 2015-05-05 NOTE — Assessment & Plan Note (Signed)
Due for DM and cholesterol screen. Encouraged exercise, weight loss, healthy eating habits.

## 2015-05-05 NOTE — Assessment & Plan Note (Signed)
Untreated, but mild per pt no CPAP indicated.

## 2015-05-05 NOTE — Patient Instructions (Addendum)
Start diclofenac 75 mg twice daily for pain and inflammation.  Start muscle relaxant at night as needed.  Heat , massage on low back.  Start home exercises.  If not improving in 2 weeks, follow up.

## 2015-05-05 NOTE — Progress Notes (Signed)
Pre visit review using our clinic review tool, if applicable. No additional management support is needed unless otherwise documented below in the visit note. 

## 2015-05-05 NOTE — Assessment & Plan Note (Addendum)
Only has issues when sick. Uses albuterol prn. No controller required.

## 2015-05-05 NOTE — Assessment & Plan Note (Signed)
Due for re-eval on no med. 

## 2015-05-05 NOTE — Progress Notes (Signed)
   Subjective:    Patient ID: Jody Jensen, female    DOB: 05/18/1971, 44 y.o.   MRN: [id number]  HPI  44 year old female presents to re-establish care. She was without insurance in last few years. Last CPX was 4-5 years ago.  Partial hysterectomy, no pap needed, just DVE.   She has acute illness of low back pain. She is morbidly obese.   Ever since gallbladder surgery she has had slightly elevated LFTs.  History of hep A, years ago now resolved.  Right lower back pain, right buttock pain. Ongoing x 1 week. No fall, or injury. 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  Pain with sitting and walking.  Right leg feels weak when getting in and out of car.  Tingling in both legs.  No incontinence, no fever. No rash. She has tried 1 aleve twice daily.Marland Kitchen. Helps minimally. Treating with ice.   Has history of back pain in past, no surgery, no injections. Usually goes away on its own.   Social History /Family History/Past Medical History reviewed and updated if needed.    Review of Systems  Constitutional: Positive for fatigue. Negative for fever.  HENT: Negative for ear pain.   Eyes: Negative for pain.  Respiratory: Negative for chest tightness and shortness of breath.   Cardiovascular: Negative for chest pain, palpitations and leg swelling.  Gastrointestinal: Negative for abdominal pain.  Genitourinary: Negative for dysuria.       Objective:   Physical Exam  Constitutional: Vital signs are normal. She appears well-developed and well-nourished. She is cooperative.  Non-toxic appearance. She does not appear ill. No distress.  Morbidly obese  HENT:  Head: Normocephalic.  Right Ear: Hearing, tympanic membrane, external ear and ear canal normal.  Left Ear: Hearing, tympanic membrane, external ear and ear canal normal.  Nose: Nose normal.  Eyes: Conjunctivae, EOM and lids are normal. Pupils are equal, round, and reactive to light. Lids are everted and swept, no foreign bodies  found.  Neck: Trachea normal and normal range of motion. Neck supple. Carotid bruit is not present. No thyroid mass and no thyromegaly present.  Cardiovascular: Normal rate, regular rhythm, S1 normal, S2 normal, normal heart sounds and intact distal pulses.  Exam reveals no gallop.   No murmur heard. Pulmonary/Chest: Effort normal and breath sounds normal. No respiratory distress. She has no wheezes. She has no rhonchi. She has no rales.  Abdominal: Soft. Normal appearance and bowel sounds are normal. She exhibits no distension, no fluid wave, no abdominal bruit and no mass. There is no hepatosplenomegaly. There is no tenderness. There is no rebound, no guarding and no CVA tenderness. No hernia.  Musculoskeletal:       Lumbar back: She exhibits decreased range of motion and tenderness. She exhibits no bony tenderness.  positive SLR on right, mildly, pain in low back with faber's, no hip pain   Lymphadenopathy:    She has no cervical adenopathy.    She has no axillary adenopathy.  Neurological: She is alert. She has normal strength. No cranial nerve deficit or sensory deficit. Gait abnormal.  Skin: Skin is warm, dry and intact. No rash noted.  Psychiatric: Her speech is normal and behavior is normal. Judgment normal. Her mood appears not anxious. Cognition and memory are normal. She does not exhibit a depressed mood.          Assessment & Plan:

## 2015-08-31 ENCOUNTER — Telehealth: Payer: Self-pay | Admitting: Family Medicine

## 2015-08-31 DIAGNOSIS — G4733 Obstructive sleep apnea (adult) (pediatric): Secondary | ICD-10-CM

## 2015-08-31 NOTE — Telephone Encounter (Signed)
-----   Message from Baldomero LamyNatasha C Chavers sent at 08/22/2015  1:59 PM EDT ----- Regarding: Cpx labs Fri 3/31, need orders. Thanks! :-) Please order  future cpx labs for pt's upcoming lab appt. Thanks Rodney Boozeasha

## 2015-09-01 ENCOUNTER — Other Ambulatory Visit (INDEPENDENT_AMBULATORY_CARE_PROVIDER_SITE_OTHER): Payer: 59

## 2015-09-01 ENCOUNTER — Telehealth: Payer: Self-pay | Admitting: Family Medicine

## 2015-09-01 DIAGNOSIS — Z8349 Family history of other endocrine, nutritional and metabolic diseases: Secondary | ICD-10-CM

## 2015-09-01 DIAGNOSIS — G4733 Obstructive sleep apnea (adult) (pediatric): Secondary | ICD-10-CM

## 2015-09-01 LAB — TSH: TSH: 1.2 u[IU]/mL (ref 0.35–4.50)

## 2015-09-01 LAB — LIPID PANEL
CHOL/HDL RATIO: 3
Cholesterol: 166 mg/dL (ref 0–200)
HDL: 47.5 mg/dL (ref >39.00)
LDL CALC: 98 mg/dL (ref 0–99)
NonHDL: 118.13
TRIGLYCERIDES: 103 mg/dL (ref 0.0–149.0)
VLDL: 20.6 mg/dL (ref 0.0–40.0)

## 2015-09-01 LAB — COMPREHENSIVE METABOLIC PANEL
ALT: 27 U/L (ref 0–35)
AST: 18 U/L (ref 0–37)
Albumin: 3.9 g/dL (ref 3.5–5.2)
Alkaline Phosphatase: 65 U/L (ref 39–117)
BUN: 11 mg/dL (ref 6–23)
CALCIUM: 8.9 mg/dL (ref 8.4–10.5)
CHLORIDE: 105 meq/L (ref 96–112)
CO2: 29 meq/L (ref 19–32)
CREATININE: 0.73 mg/dL (ref 0.40–1.20)
GFR: 92.02 mL/min (ref >60.00)
Glucose, Bld: 99 mg/dL (ref 70–99)
Potassium: 3.9 mEq/L (ref 3.5–5.1)
Sodium: 140 mEq/L (ref 135–145)
Total Bilirubin: 0.5 mg/dL (ref 0.2–1.2)
Total Protein: 6.3 g/dL (ref 6.0–8.3)

## 2015-09-01 LAB — T4, FREE: Free T4: 0.84 ng/dL (ref 0.60–1.60)

## 2015-09-01 LAB — T3, FREE: T3, Free: 3.2 pg/mL (ref 2.3–4.2)

## 2015-09-01 NOTE — Telephone Encounter (Signed)
-----   Message from Alvina Chouerri J Walsh sent at 09/01/2015  8:14 AM EDT ----- Regarding: add labs??? Pt wants her thyroid checked.she was here for her lab draw today

## 2015-09-08 ENCOUNTER — Encounter: Payer: Self-pay | Admitting: Family Medicine

## 2015-09-08 ENCOUNTER — Ambulatory Visit (INDEPENDENT_AMBULATORY_CARE_PROVIDER_SITE_OTHER): Payer: 59 | Admitting: Family Medicine

## 2015-09-08 VITALS — BP 122/80 | HR 83 | Temp 98.6°F | Ht 64.0 in | Wt 271.0 lb

## 2015-09-08 DIAGNOSIS — J4520 Mild intermittent asthma, uncomplicated: Secondary | ICD-10-CM

## 2015-09-08 DIAGNOSIS — Z832 Family history of diseases of the blood and blood-forming organs and certain disorders involving the immune mechanism: Secondary | ICD-10-CM

## 2015-09-08 DIAGNOSIS — G4733 Obstructive sleep apnea (adult) (pediatric): Secondary | ICD-10-CM

## 2015-09-08 DIAGNOSIS — E7800 Pure hypercholesterolemia, unspecified: Secondary | ICD-10-CM

## 2015-09-08 DIAGNOSIS — Z0000 Encounter for general adult medical examination without abnormal findings: Secondary | ICD-10-CM

## 2015-09-08 MED ORDER — ALBUTEROL SULFATE HFA 108 (90 BASE) MCG/ACT IN AERS: 2.0000 | INHALATION_SPRAY | Freq: Four times a day (QID) | RESPIRATORY_TRACT | Status: DC | PRN

## 2015-09-08 MED ORDER — MONTELUKAST SODIUM 10 MG PO TABS: 10.0000 mg | ORAL_TABLET | Freq: Every day | ORAL | Status: DC

## 2015-09-08 NOTE — Patient Instructions (Addendum)
Add Singulair for better breathing asthma control during allergy season. Call if wheeze not improving as expected.  Use albuterol for rescue.  Get back on track with healthy eating habit, low carb. Start exercise 3-5 times a week.  If fatigue not improving call  For referral for sleep evaluation.  Stop at lab on way out. Check with mother about whether she had ovarian or cervical cancer.

## 2015-09-08 NOTE — Assessment & Plan Note (Signed)
Refill albuterol. Start Singulair for better control.

## 2015-09-08 NOTE — Progress Notes (Signed)
Subjective:    Patient ID: Jody Jensen, female    DOB: [birth year], 45 y.o.   MRN: [id number]  HPI  The patient is here for annual wellness exam and preventative care.    Elevated Cholesterol:  At goal on no med. Lab Results  Component Value Date   CHOL 166 09/01/2015   HDL 47.50 09/01/2015   LDLCALC 98 09/01/2015   TRIG 103.0 09/01/2015   CHOLHDL 3 09/01/2015   OSA; untreated , mild per pt. Test 18 years ago. Has had weight gain since then.  Snores at night. Some fatigue.   BP Readings from Last 3 Encounters:  09/08/15 122/80  05/05/15 112/82  01/29/13 122/72   Morbid obesity:   Exercise:  none Diet: poor  Asthma,  Mild intermittant  She has noted SOB and wheeze, worse in last month.  Daily cough, sneeze, tickle in throat.  Tried  Allergra.Marland Kitchen Helps a little.  Does not have albuterol.  Social History /Family History/Past Medical History reviewed and updated if needed.   Review of Systems  Constitutional: Positive for fatigue. Negative for fever.  HENT: Negative for congestion.   Eyes: Negative for pain.  Respiratory: Positive for shortness of breath and wheezing. Negative for cough.   Cardiovascular: Negative for chest pain, palpitations and leg swelling.  Gastrointestinal: Negative for abdominal pain.  Genitourinary: Negative for dysuria and vaginal bleeding.  Musculoskeletal: Positive for back pain.  Neurological: Negative for syncope, light-headedness and headaches.  Psychiatric/Behavioral: Negative for dysphoric mood.       Objective:   Physical Exam  Constitutional: Vital signs are normal. She appears well-developed and well-nourished. She is cooperative.  Non-toxic appearance. She does not appear ill. No distress.  HENT:  Head: Normocephalic.  Right Ear: Hearing, tympanic membrane, external ear and ear canal normal.  Left Ear: Hearing, tympanic membrane, external ear and ear canal normal.  Nose: Nose normal.  Eyes: Conjunctivae, EOM and lids are normal.  Pupils are equal, round, and reactive to light. Lids are everted and swept, no foreign bodies found.  Neck: Trachea normal and normal range of motion. Neck supple. Carotid bruit is not present. No thyroid mass and no thyromegaly present.  Cardiovascular: Normal rate, regular rhythm, S1 normal, S2 normal, normal heart sounds and intact distal pulses.  Exam reveals no gallop.   No murmur heard. Pulmonary/Chest: Effort normal and breath sounds normal. No respiratory distress. She has no wheezes. She has no rhonchi. She has no rales.  Abdominal: Soft. Normal appearance and bowel sounds are normal. She exhibits no distension, no fluid wave, no abdominal bruit and no mass. There is no hepatosplenomegaly. There is no tenderness. There is no rebound, no guarding and no CVA tenderness. No hernia.  Genitourinary: Uterus normal. No breast swelling, tenderness, discharge or bleeding. Pelvic exam was performed with patient supine. There is no rash, tenderness or lesion on the right labia. There is no rash, tenderness or lesion on the left labia. Uterus is not deviated, not enlarged, not fixed and not tender. Right adnexum displays no mass, no tenderness and no fullness. Left adnexum displays no mass, no tenderness and no fullness.  Lymphadenopathy:    She has no cervical adenopathy.    She has no axillary adenopathy.  Neurological: She is alert. She has normal strength. No cranial nerve deficit or sensory deficit.  Skin: Skin is warm, dry and intact. No rash noted.  Psychiatric: Her speech is normal and behavior is normal. Judgment normal. Her mood appears not anxious. Cognition  and memory are normal. She does not exhibit a depressed mood.          Assessment & Plan:  The patient's preventative maintenance and recommended screening tests for an annual wellness exam were reviewed in full today. Brought up to date unless services declined.  Counselled on the importance of diet, exercise, and its role in  overall health and mortality. The patient's FH and SH was reviewed, including their home life, tobacco status, and drug and alcohol status.   Vaccines: Tdap given today DVE/PAP: partial hysterectomy, year DVE, no pap  No early colon or breast cancer  HIV/STD testing: refused  former smoker  factor 2 mutation  issue blood clotting disorder: dad, uncle, PGM.

## 2015-09-08 NOTE — Progress Notes (Signed)
Pre visit review using our clinic review tool, if applicable. No additional management support is needed unless otherwise documented below in the visit note. 

## 2015-09-14 LAB — PROTHROMBIN GENE MUTATION

## 2015-09-26 DIAGNOSIS — Z832 Family history of diseases of the blood and blood-forming organs and certain disorders involving the immune mechanism: Secondary | ICD-10-CM | POA: Insufficient documentation

## 2015-09-26 NOTE — Assessment & Plan Note (Signed)
Previous test 18 years ago.  If fatigue not improving call  for referral for sleep evaluation.

## 2015-09-26 NOTE — Assessment & Plan Note (Signed)
Encouraged exercise, weight loss, healthy eating habits. ? ?

## 2015-09-26 NOTE — Assessment & Plan Note (Signed)
Well controlled with lifestyle on no medication.

## 2015-09-26 NOTE — Assessment & Plan Note (Signed)
eval with labs. 

## 2016-07-05 ENCOUNTER — Ambulatory Visit (INDEPENDENT_AMBULATORY_CARE_PROVIDER_SITE_OTHER): Payer: 59 | Admitting: Primary Care

## 2016-07-05 ENCOUNTER — Encounter: Payer: Self-pay | Admitting: Primary Care

## 2016-07-05 VITALS — BP 124/84 | HR 80 | Temp 98.2°F | Ht 64.0 in | Wt 272.1 lb

## 2016-07-05 DIAGNOSIS — R350 Frequency of micturition: Secondary | ICD-10-CM | POA: Diagnosis not present

## 2016-07-05 LAB — POC URINALSYSI DIPSTICK (AUTOMATED)
Bilirubin, UA: NEGATIVE
Glucose, UA: NEGATIVE
Ketones, UA: NEGATIVE
Leukocytes, UA: NEGATIVE
NITRITE UA: NEGATIVE
PH UA: 6
PROTEIN UA: NEGATIVE
RBC UA: NEGATIVE
Spec Grav, UA: >=1.03
Urobilinogen, UA: NEGATIVE

## 2016-07-05 LAB — HEMOGLOBIN A1C: HEMOGLOBIN A1C: 5.7 % (ref 4.6–6.5)

## 2016-07-05 NOTE — Progress Notes (Signed)
Pre visit review using our clinic review tool, if applicable. No additional management support is needed unless otherwise documented below in the visit note. 

## 2016-07-05 NOTE — Addendum Note (Signed)
Addended by: Tawnya CrookSAMBATH, Samaa Ueda on: 07/05/2016 09:51 AM   Modules accepted: Orders

## 2016-07-05 NOTE — Progress Notes (Signed)
Subjective:    Patient ID: Jody Jensen, female    DOB: 07/20/1970, 46 y.o.   MRN: [id number]  HPI  Jody Jensen is a 46 year old female with a history of UTI and obesitywho presents today with a chief complaint of urinary frequency. She also reports right flank pain. Her symptoms began 1 week ago. She denies fevers, hematuria, vaginal discharge, abdominal pain, polydipsia, numbness/tingling. She's increased her consumption of water. She feels as though her symptoms are worse.  Review of Systems  Constitutional: Negative for fatigue and fever.  Gastrointestinal: Negative for abdominal pain and nausea.  Genitourinary: Positive for flank pain and frequency. Negative for hematuria, pelvic pain and vaginal discharge.       Past Medical History:  Diagnosis Date  . Anxiety   . Ovarian cyst   . Sleep apnea      Social History   Social History  . Marital status: Married    Spouse name: N/A  . Number of children: N/A  . Years of education: N/A   Occupational History  . Not on file.   Social History Main Topics  . Smoking status: Former Smoker    Types: Cigarettes    Quit date: 02/03/2012  . Smokeless tobacco: Never Used  . Alcohol use No  . Drug use: No  . Sexual activity: Yes    Birth control/ protection: Surgical   Other Topics Concern  . Not on file   Social History Narrative  . No narrative on file    Past Surgical History:  Procedure Laterality Date  . ABDOMINAL HYSTERECTOMY      partial, ovaries remain, no cervix  . bone spur Right   . BREAST SURGERY     benign breast cyst removal  . CESAREAN SECTION    . NASAL SINUS SURGERY    . ovarian cyst removal    . TUBAL LIGATION      Family History  Problem Relation Age of Onset  . Hypertension Mother   . Hyperlipidemia Mother   . Heart disease Mother   . Diabetes Mother   . Asthma Mother   . Cancer Paternal Grandmother     cervical  . Diabetes Paternal Grandfather   . Stroke Paternal Grandfather   . Thyroid  disease Mother     No Known Allergies  Current Outpatient Prescriptions on File Prior to Visit  Medication Sig Dispense Refill  . albuterol (PROVENTIL HFA;VENTOLIN HFA) 108 (90 Base) MCG/ACT inhaler Inhale 2 puffs into the lungs every 6 (six) hours as needed for wheezing or shortness of breath. 1 Inhaler 2  . montelukast (SINGULAIR) 10 MG tablet Take 1 tablet (10 mg total) by mouth at bedtime. 30 tablet 3   No current facility-administered medications on file prior to visit.     BP 124/84   Pulse 80   Temp 98.2 F (36.8 C) (Oral)   Ht 5\' 4"  (1.626 m)   Wt 272 lb 1.9 oz (123.4 kg)   LMP 11/02/1997   SpO2 97%   BMI 46.71 kg/m    Objective:   Physical Exam  Constitutional: She appears well-nourished.  Neck: Neck supple.  Cardiovascular: Normal rate and regular rhythm.   Pulmonary/Chest: Effort normal and breath sounds normal.  Abdominal: Soft. There is no CVA tenderness.  Skin: Skin is warm and dry.          Assessment & Plan:  Urinary Frequency:  Also with right flank pain x 1 week. Overall feeling worse, no vaginal  symptoms. Exam unremarkable. UA: Negative. Will culture given symptoms. Suspect flank pain related to muscle strain. CMP in 2017 with fasting blood sugar of 99, A1C in 2014 of 5.8.  Check A1C today given history of obesity.  Morrie Sheldon, NP

## 2016-07-05 NOTE — Patient Instructions (Addendum)
Complete lab work prior to leaving today. I will notify you of your results once received.   Ensure you are consuming 64 ounces of water daily.  Please call me if you develop vaginal discharge, blood in your urine, increased pain.  It was a pleasure meeting you!

## 2016-07-07 LAB — URINE CULTURE: ORGANISM ID, BACTERIA: NO GROWTH

## 2017-01-06 ENCOUNTER — Ambulatory Visit (INDEPENDENT_AMBULATORY_CARE_PROVIDER_SITE_OTHER)
Admission: RE | Admit: 2017-01-06 | Discharge: 2017-01-06 | Disposition: A | Discharge status: Home / Self Care | Payer: 59 | Source: Ambulatory Visit | Attending: Primary Care | Admitting: Primary Care

## 2017-01-06 ENCOUNTER — Ambulatory Visit (INDEPENDENT_AMBULATORY_CARE_PROVIDER_SITE_OTHER): Payer: 59 | Admitting: Primary Care

## 2017-01-06 VITALS — BP 124/84 | HR 71 | Temp 98.1°F | Ht 64.0 in | Wt 278.0 lb

## 2017-01-06 DIAGNOSIS — M545 Low back pain, unspecified: Secondary | ICD-10-CM

## 2017-01-06 MED ORDER — CYCLOBENZAPRINE HCL 10 MG PO TABS: 5.0000 mg | ORAL_TABLET | Freq: Three times a day (TID) | ORAL | 0 refills | Status: DC | PRN

## 2017-01-06 NOTE — Patient Instructions (Signed)
Complete lab work and xray prior to leaving today. I will notify you of your results once received.   You may take the Flexeril as needed for muscle spasms. Try 1/2 -1 tablet every 8 hours as needed. This may cause drowsiness so you may want to take this at bedtime.  Refrain from sitting still for too long as this will increase stiffness.   Apply heat/ice to area of discomfort.  It was a pleasure to see you today!   Back Pain, Adult Back pain is very common in adults.The cause of back pain is rarely dangerous and the pain often gets better over time.The cause of your back pain may not be known. Some common causes of back pain include:  Strain of the muscles or ligaments supporting the spine.  Wear and tear (degeneration) of the spinal disks.  Arthritis.  Direct injury to the back.  For many people, back pain may return. Since back pain is rarely dangerous, most people can learn to manage this condition on their own. Follow these instructions at home: Watch your back pain for any changes. The following actions may help to lessen any discomfort you are feeling:  Remain active. It is stressful on your back to sit or stand in one place for long periods of time. Do not sit, drive, or stand in one place for more than 30 minutes at a time. Take short walks on even surfaces as soon as you are able.Try to increase the length of time you walk each day.  Exercise regularly as directed by your health care provider. Exercise helps your back heal faster. It also helps avoid future injury by keeping your muscles strong and flexible.  Do not stay in bed.Resting more than 1-2 days can delay your recovery.  Pay attention to your body when you bend and lift. The most comfortable positions are those that put less stress on your recovering back. Always use proper lifting techniques, including: ? Bending your knees. ? Keeping the load close to your body. ? Avoiding twisting.  Find a comfortable  position to sleep. Use a firm mattress and lie on your side with your knees slightly bent. If you lie on your back, put a pillow under your knees.  Avoid feeling anxious or stressed.Stress increases muscle tension and can worsen back pain.It is important to recognize when you are anxious or stressed and learn ways to manage it, such as with exercise.  Take medicines only as directed by your health care provider. Over-the-counter medicines to reduce pain and inflammation are often the most helpful.Your health care provider may prescribe muscle relaxant drugs.These medicines help dull your pain so you can more quickly return to your normal activities and healthy exercise.  Apply ice to the injured area: ? Put ice in a plastic bag. ? Place a towel between your skin and the bag. ? Leave the ice on for 20 minutes, 2-3 times a day for the first 2-3 days. After that, ice and heat may be alternated to reduce pain and spasms.  Maintain a healthy weight. Excess weight puts extra stress on your back and makes it difficult to maintain good posture.  Contact a health care provider if:  You have pain that is not relieved with rest or medicine.  You have increasing pain going down into the legs or buttocks.  You have pain that does not improve in one week.  You have night pain.  You lose weight.  You have a fever or chills.  Get help right away if:  You develop new bowel or bladder control problems.  You have unusual weakness or numbness in your arms or legs.  You develop nausea or vomiting.  You develop abdominal pain.  You feel faint. This information is not intended to replace advice given to you by your health care provider. Make sure you discuss any questions you have with your health care provider. Document Released: 05/20/2005 Document Revised: 09/28/2015 Document Reviewed: 09/21/2013 Elsevier Interactive Patient Education  2017 [employer].

## 2017-01-06 NOTE — Progress Notes (Addendum)
Subjective:    Patient ID: Chandra Batchnna Coble, female    DOB: 12/19/1970, 46 y.o.   MRN: [id number]  HPI  Ms. Shela NevinCoble is a 46 year old female with a history of morbid obesity who presents today with a chief complaint of back pain. Her pain is located to the mid and right lower back that has been intermittent for years. Yesterday she noticed her pain increase which was at its worst last night. She describes her pain as a "catch". She has experienced intermittent numbness/tingling to her right lower extremity in the past, none on this recent episode.  Overall her pain is about the same. She's been taking Advil 400 mg last night and Advil 800 mg this morning without much improvement. She had to leave work today as her pain was bothersome while sitting at her desk.  She denies injury/trauma, numbness/tingling/radiation to extremity, weakness. She denies ever having plain films of her lumbar spine.   Review of Systems  Respiratory: Negative for shortness of breath.   Cardiovascular: Negative for chest pain.  Musculoskeletal: Positive for back pain.  Skin: Negative for color change.  Neurological: Negative for weakness and numbness.       Past Medical History:  Diagnosis Date  . Anxiety   . Ovarian cyst   . Sleep apnea      Social History   Social History  . Marital status: Married    Spouse name: N/A  . Number of children: N/A  . Years of education: N/A   Occupational History  . Not on file.   Social History Main Topics  . Smoking status: Former Smoker    Types: Cigarettes    Quit date: 02/03/2012  . Smokeless tobacco: Never Used  . Alcohol use No  . Drug use: No  . Sexual activity: Yes    Birth control/ protection: Surgical   Other Topics Concern  . Not on file   Social History Narrative  . No narrative on file    Past Surgical History:  Procedure Laterality Date  . ABDOMINAL HYSTERECTOMY      partial, ovaries remain, no cervix  . bone spur Right   . BREAST SURGERY     benign breast cyst removal  . CESAREAN SECTION    . NASAL SINUS SURGERY    . ovarian cyst removal    . TUBAL LIGATION      Family History  Problem Relation Age of Onset  . Hypertension Mother   . Hyperlipidemia Mother   . Heart disease Mother   . Diabetes Mother   . Asthma Mother   . Cancer Paternal Grandmother        cervical  . Diabetes Paternal Grandfather   . Stroke Paternal Grandfather   . Thyroid disease Mother     No Known Allergies  Current Outpatient Prescriptions on File Prior to Visit  Medication Sig Dispense Refill  . albuterol (PROVENTIL HFA;VENTOLIN HFA) 108 (90 Base) MCG/ACT inhaler Inhale 2 puffs into the lungs every 6 (six) hours as needed for wheezing or shortness of breath. 1 Inhaler 2  . montelukast (SINGULAIR) 10 MG tablet Take 1 tablet (10 mg total) by mouth at bedtime. 30 tablet 3   No current facility-administered medications on file prior to visit.     BP 124/84   Pulse 71   Temp 98.1 F (36.7 C) (Oral)   Ht 5\' 4"  (1.626 m)   Wt 278 lb (126.1 kg)   LMP 11/02/1997   SpO2 99%  BMI 47.72 kg/m    Objective:   Physical Exam  Constitutional: She appears well-nourished.  Neck: Neck supple.  Cardiovascular: Normal rate.   Pulmonary/Chest: Effort normal.  Musculoskeletal:       Lumbar back: She exhibits decreased range of motion and pain. She exhibits no tenderness and no bony tenderness.  Negative straight leg raise bilaterally, 5/5 lower extremity strength.          Assessment & Plan:  Acute on Chronic Back Pain:  Chronic for years, recent flare. Exam today overall unremarkable. No weakness, negative straight leg raise. Suspect MSK vs lumbar spine involvement. Check plain films today given chronicity. Rx for Flexeril sent to pharmacy. Discussed NSAID use. Stretching exercises provided. Consider PT. Will await xray results.  Morrie Sheldon, NP

## 2017-12-26 DIAGNOSIS — Z79899 Other long term (current) drug therapy: Secondary | ICD-10-CM | POA: Diagnosis not present

## 2017-12-26 DIAGNOSIS — R0602 Shortness of breath: Secondary | ICD-10-CM | POA: Diagnosis not present

## 2017-12-26 DIAGNOSIS — E8881 Metabolic syndrome: Secondary | ICD-10-CM | POA: Diagnosis not present

## 2017-12-26 DIAGNOSIS — R350 Frequency of micturition: Secondary | ICD-10-CM | POA: Diagnosis not present

## 2017-12-26 DIAGNOSIS — R5383 Other fatigue: Secondary | ICD-10-CM | POA: Diagnosis not present

## 2017-12-26 DIAGNOSIS — R635 Abnormal weight gain: Secondary | ICD-10-CM | POA: Diagnosis not present

## 2017-12-26 DIAGNOSIS — E7800 Pure hypercholesterolemia, unspecified: Secondary | ICD-10-CM | POA: Diagnosis not present

## 2018-01-23 DIAGNOSIS — M79672 Pain in left foot: Secondary | ICD-10-CM | POA: Diagnosis not present

## 2018-01-23 DIAGNOSIS — Z79899 Other long term (current) drug therapy: Secondary | ICD-10-CM | POA: Diagnosis not present

## 2018-01-23 DIAGNOSIS — R635 Abnormal weight gain: Secondary | ICD-10-CM | POA: Diagnosis not present

## 2018-01-23 DIAGNOSIS — M79671 Pain in right foot: Secondary | ICD-10-CM | POA: Diagnosis not present

## 2018-01-30 DIAGNOSIS — E119 Type 2 diabetes mellitus without complications: Secondary | ICD-10-CM | POA: Diagnosis not present

## 2018-01-30 DIAGNOSIS — M722 Plantar fascial fibromatosis: Secondary | ICD-10-CM | POA: Diagnosis not present

## 2018-01-30 DIAGNOSIS — M79671 Pain in right foot: Secondary | ICD-10-CM | POA: Diagnosis not present

## 2018-04-06 DIAGNOSIS — L600 Ingrowing nail: Secondary | ICD-10-CM | POA: Diagnosis not present

## 2018-04-18 ENCOUNTER — Encounter: Payer: Self-pay | Admitting: Podiatry

## 2018-04-18 ENCOUNTER — Ambulatory Visit: Payer: 59 | Admitting: Podiatry

## 2018-04-18 DIAGNOSIS — L02611 Cutaneous abscess of right foot: Secondary | ICD-10-CM | POA: Diagnosis not present

## 2018-04-18 DIAGNOSIS — L600 Ingrowing nail: Secondary | ICD-10-CM | POA: Diagnosis not present

## 2018-04-18 DIAGNOSIS — L03031 Cellulitis of right toe: Secondary | ICD-10-CM

## 2018-04-18 MED ORDER — TRAMADOL HCL 50 MG PO TABS: 50.0000 mg | ORAL_TABLET | Freq: Two times a day (BID) | ORAL | 0 refills | Status: DC | PRN

## 2018-04-18 MED ORDER — CEPHALEXIN 500 MG PO CAPS: 500.0000 mg | ORAL_CAPSULE | Freq: Three times a day (TID) | ORAL | 0 refills | Status: DC

## 2018-04-18 NOTE — Progress Notes (Signed)
Subjective:    Patient ID: Jody Jensen, female    DOB: 10/23/1970, 47 y.o.   MRN: [id number]  HPI 47 year old female presents the office today for concerns of her right big toenail.  This is been ongoing for last couple weeks.  She states that she did stop her tone and she went and had a pedicure.  Since then she has had issues last week and is been read on the toenail.  She went to her primary care physician she was put on antibiotics which she did finish.  Nails tender with pressure in shoes.  No other concerns today.   Review of Systems  All other systems reviewed and are negative.  Past Medical History:  Diagnosis Date  . Anxiety   . Ovarian cyst   . Sleep apnea     Past Surgical History:  Procedure Laterality Date  . ABDOMINAL HYSTERECTOMY      partial, ovaries remain, no cervix  . bone spur Right   . BREAST SURGERY     benign breast cyst removal  . CESAREAN SECTION    . NASAL SINUS SURGERY    . ovarian cyst removal    . TUBAL LIGATION       Current Outpatient Medications:  .  cephALEXin (KEFLEX) 500 MG capsule, Take 1 capsule (500 mg total) by mouth 3 (three) times daily., Disp: 21 capsule, Rfl: 0 .  traMADol (ULTRAM) 50 MG tablet, Take 1 tablet (50 mg total) by mouth every 12 (twelve) hours as needed., Disp: 8 tablet, Rfl: 0 .  Vitamin D, Ergocalciferol, (DRISDOL) 1.25 MG (50000 UT) CAPS capsule, Take 50,000 Units by mouth once a week., Disp: , Rfl: 5  No Known Allergies       Objective:   Physical Exam  General: AAO x3, NAD  Dermatological: Incurvation present to the right hallux toenail.  The nail has some discoloration with white and brown discoloration of the nail.  There is evidence of drainage underneath the toenail more along the lateral aspect going more towards the central aspect as well.  There is edema erythema to the distal portion of the toe but there is no ascending cellulitis.  Unable to elicit any drainage or pus today but unable to visualize  that there is drainage from the toenail.  No open lesions identified.  Vascular: Dorsalis Pedis artery and Posterior Tibial artery pedal pulses are 2/4 bilateral with immedate capillary fill time. There is no pain with calf compression, swelling, warmth, erythema.   Neruologic: Grossly intact via light touch bilateral. Protective threshold with Semmes Wienstein monofilament intact to all pedal sites bilateral.   Musculoskeletal: Tenderness to the right hallux toenail but no other areas of tenderness elicited.  No gross boney pedal deformities bilateral. No pain, crepitus, or limitation noted with foot and ankle range of motion bilateral. Muscular strength 5/5 in all groups tested bilateral.  Gait: Unassisted, Nonantalgic.     Assessment & Plan:  Right hallux toenail infection -Treatment options discussed including all alternatives, risks, and complications -Etiology of symptoms were discussed -At this time I recommended total nail avulsion given the infection.  We discussed the procedure as well as alternatives, risks, complications she wishes to proceed and written consent was signed.  The toe was anesthetized with 3 cc of lidocaine and Marcaine plain.  Once anesthetized the skin was prepped in sterile fashion and tourniquet was applied.  The right toenail was then removed in total.  There was purulence identified under the toenail and  the wound was cultured.  After debridement there is no further purulence and underlying skin intact without any lacerations.  Areas irrigated with alcohol.  Silvadene was applied followed by dry sterile dressing.  The tourniquet was released and there is found to be an immediate cap refill time to the toe.  She tolerated well without any complications.  Post procedure instructions were discussed. -Keflex  Return in about 1 week (around 04/25/2018) for nail check.  Vivi BarrackMatthew R Maclin Guerrette DPM

## 2018-04-18 NOTE — Patient Instructions (Signed)

## 2018-04-20 ENCOUNTER — Telehealth: Payer: Self-pay | Admitting: Podiatry

## 2018-04-20 NOTE — Telephone Encounter (Signed)
Pt presented to office for surgical shoe. [name], CMA states she will fit pt for the surgical shoe.

## 2018-04-20 NOTE — Telephone Encounter (Signed)
I called pt and she states she has been soaking the foot and the gauze will in not come off the toe. I instructed pt to soak and lightly rough the stuck area with a clean cloth. Pt states it is too sore to do that. I told pt the gently lift one edge of the shiny telfa and pour water beneath the lifted telfa and that would soften and help the telfa loosen. 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 I told her she could get a surgical shoe and we would bill her insurance.

## 2018-04-20 NOTE — Telephone Encounter (Signed)
I saw Dr. Ardelle AntonWagoner on Saturday and had a toenail removed. I went to start the soaks yesterday but I still have gauze on the toenail. I can be reached at [phone].

## 2018-04-21 LAB — WOUND CULTURE
MICRO NUMBER: 91384020
RESULT:: NO GROWTH
SPECIMEN QUALITY: ADEQUATE

## 2018-04-24 ENCOUNTER — Ambulatory Visit: Payer: 59 | Admitting: Podiatry

## 2018-04-24 ENCOUNTER — Encounter: Payer: Self-pay | Admitting: Podiatry

## 2018-04-24 DIAGNOSIS — L02611 Cutaneous abscess of right foot: Secondary | ICD-10-CM

## 2018-04-24 DIAGNOSIS — L03031 Cellulitis of right toe: Secondary | ICD-10-CM

## 2018-04-24 DIAGNOSIS — L600 Ingrowing nail: Secondary | ICD-10-CM

## 2018-04-24 NOTE — Patient Instructions (Signed)

## 2018-04-24 NOTE — Progress Notes (Signed)
Subjective: Jody Jensen is a 47 y.o.  female returns to office today for follow up evaluation after having right Hallux total nail avulsion performed due to infection. Patient has been soaking using epsom salts and applying topical antibiotic covered with bandaid daily. It is still tender but overall improving. No pus or red streaks. Patient denies fevers, chills, nausea, vomiting. Denies any calf pain, chest pain, SOB.   Objective:  Vitals: Reviewed  General: Well developed, nourished, in no acute distress, alert and oriented x3   Dermatology: Skin is warm, dry and supple bilateral. RIGHT hallux nail bed appears to be clean, dry, with mild granular tissue and surrounding scab. There is no surrounding erythema, edema, drainage/purulence. The remaining nails appear unremarkable at this time. There are no other lesions or other signs of infection present.  Neurovascular status: Intact. No lower extremity swelling; No pain with calf compression bilateral.  Musculoskeletal: Mild tenderness to palpation of the left hallux nail fold. Muscular strength within normal limits bilateral.   Assesement and Plan: S/p partial nail avulsion, doing well.   -Continue soaking in epsom salts twice a day followed by antibiotic ointment and a band-aid. Can leave uncovered at night. Continue this until completely healed.  -If the area has not healed in 2 weeks, call the office for follow-up appointment, or sooner if any problems arise.  -Monitor for any signs/symptoms of infection. Call the office immediately if any occur or go directly to the emergency room. Call with any questions/concerns.  Ovid CurdMatthew , DPM

## 2018-04-28 ENCOUNTER — Telehealth: Payer: Self-pay | Admitting: *Deleted

## 2018-04-28 NOTE — Telephone Encounter (Signed)
Thanks

## 2018-04-28 NOTE — Telephone Encounter (Signed)
-----   Message from Vivi BarrackMatthew R Wagoner, DPM sent at 04/28/2018  9:20 AM EST ----- Val- please let her know that her wound culture did not show any bacteria. Can you please let her know and see how she is doing? Thanks.

## 2018-04-28 NOTE — Telephone Encounter (Signed)
Left message on pt's mobile to call for results.

## 2018-04-28 NOTE — Telephone Encounter (Signed)
I informed pt of Dr. Gabriel RungWagoner's review of results and asked how her foot was doing. Pt states her foot is doing so much better.

## 2018-05-25 DIAGNOSIS — J069 Acute upper respiratory infection, unspecified: Secondary | ICD-10-CM | POA: Diagnosis not present

## 2018-06-22 DIAGNOSIS — R399 Unspecified symptoms and signs involving the genitourinary system: Secondary | ICD-10-CM | POA: Diagnosis not present

## 2018-06-22 DIAGNOSIS — R358 Other polyuria: Secondary | ICD-10-CM | POA: Diagnosis not present

## 2019-03-22 ENCOUNTER — Telehealth: Payer: Self-pay | Admitting: Family Medicine

## 2019-03-22 ENCOUNTER — Telehealth: Payer: Self-pay | Admitting: Nurse Practitioner

## 2019-03-22 NOTE — Telephone Encounter (Signed)
error 

## 2019-03-22 NOTE — Telephone Encounter (Signed)
Patient is requesting TOC. She is currently seeing Dr Diona Browner but would like to transfer her care to [facility].   Would this TOC be okay?

## 2019-03-22 NOTE — Telephone Encounter (Signed)
Have not seen since 2017.. no red flags .. okay for TOC from my viewpoint

## 2019-03-23 NOTE — Telephone Encounter (Signed)
Fine with me

## 2019-04-08 ENCOUNTER — Encounter: Payer: Self-pay | Admitting: Internal Medicine

## 2019-04-12 ENCOUNTER — Encounter: Payer: 59 | Admitting: Internal Medicine

## 2019-04-22 ENCOUNTER — Other Ambulatory Visit: Payer: Self-pay

## 2019-04-22 ENCOUNTER — Ambulatory Visit: Payer: 59 | Admitting: Internal Medicine

## 2019-04-22 ENCOUNTER — Encounter: Payer: Self-pay | Admitting: Internal Medicine

## 2019-04-22 VITALS — BP 130/84 | HR 81 | Temp 97.8°F | Wt 284.0 lb

## 2019-04-22 DIAGNOSIS — G4733 Obstructive sleep apnea (adult) (pediatric): Secondary | ICD-10-CM

## 2019-04-22 DIAGNOSIS — F419 Anxiety disorder, unspecified: Secondary | ICD-10-CM

## 2019-04-22 DIAGNOSIS — I10 Essential (primary) hypertension: Secondary | ICD-10-CM | POA: Insufficient documentation

## 2019-04-22 DIAGNOSIS — R030 Elevated blood-pressure reading, without diagnosis of hypertension: Secondary | ICD-10-CM | POA: Diagnosis not present

## 2019-04-22 DIAGNOSIS — M5442 Lumbago with sciatica, left side: Secondary | ICD-10-CM

## 2019-04-22 DIAGNOSIS — M5441 Lumbago with sciatica, right side: Secondary | ICD-10-CM | POA: Diagnosis not present

## 2019-04-22 DIAGNOSIS — Z23 Encounter for immunization: Secondary | ICD-10-CM

## 2019-04-22 DIAGNOSIS — M179 Osteoarthritis of knee, unspecified: Secondary | ICD-10-CM | POA: Insufficient documentation

## 2019-04-22 DIAGNOSIS — M1990 Unspecified osteoarthritis, unspecified site: Secondary | ICD-10-CM | POA: Insufficient documentation

## 2019-04-22 DIAGNOSIS — G8929 Other chronic pain: Secondary | ICD-10-CM

## 2019-04-22 MED ORDER — VENLAFAXINE HCL ER 37.5 MG PO CP24: 37.5000 mg | ORAL_CAPSULE | Freq: Every day | ORAL | 2 refills | Status: DC

## 2019-04-22 NOTE — Assessment & Plan Note (Signed)
Discussed how weight loss could help reduce symptoms Continue mouth device for now

## 2019-04-22 NOTE — Assessment & Plan Note (Signed)
Discussed how weight loss could help improve symptoms Discussed regular stretching and core strengthening Advised her to take Aleve 220 mg QHS prn Will refer to PT for further eval and treatment Consider MRI if pain persist or worsens

## 2019-04-22 NOTE — Assessment & Plan Note (Signed)
Situational Complicated by perimenopause Will trial Effexor 37.5 mg PO QHS prn Support offered today  Encouraged her to update me in 4-6 weeks and let me know how she is doing

## 2019-04-22 NOTE — Assessment & Plan Note (Signed)
Discussed DASH diet and exercise for weight loss Will continue to monitor at this time If remains elevated, will discuss treatment with diuretic

## 2019-04-22 NOTE — Patient Instructions (Signed)
Health Maintenance, Female Adopting a healthy lifestyle and getting preventive care are important in promoting health and wellness. Ask your health care provider about:  The right schedule for you to have regular tests and exams.  Things you can do on your own to prevent diseases and keep yourself healthy. What should I know about diet, weight, and exercise? Eat a healthy diet   Eat a diet that includes plenty of vegetables, fruits, low-fat dairy products, and lean protein.  Do not eat a lot of foods that are high in solid fats, added sugars, or sodium. Maintain a healthy weight Body mass index (BMI) is used to identify weight problems. It estimates body fat based on height and weight. Your health care provider can help determine your BMI and help you achieve or maintain a healthy weight. Get regular exercise Get regular exercise. This is one of the most important things you can do for your health. Most adults should:  Exercise for at least 150 minutes each week. The exercise should increase your heart rate and make you sweat (moderate-intensity exercise).  Do strengthening exercises at least twice a week. This is in addition to the moderate-intensity exercise.  Spend less time sitting. Even light physical activity can be beneficial. Watch cholesterol and blood lipids Have your blood tested for lipids and cholesterol at 47 years of age, then have this test every 5 years. Have your cholesterol levels checked more often if:  Your lipid or cholesterol levels are high.  You are older than 47 years of age.  You are at high risk for heart disease. What should I know about cancer screening? Depending on your health history and family history, you may need to have cancer screening at various ages. This may include screening for:  Breast cancer.  Cervical cancer.  Colorectal cancer.  Skin cancer.  Lung cancer. What should I know about heart disease, diabetes, and high blood  pressure? Blood pressure and heart disease  High blood pressure causes heart disease and increases the risk of stroke. This is more likely to develop in people who have high blood pressure readings, are of African descent, or are overweight.  Have your blood pressure checked: ? Every 3-5 years if you are 18-39 years of age. ? Every year if you are 40 years old or older. Diabetes Have regular diabetes screenings. This checks your fasting blood sugar level. Have the screening done:  Once every three years after age 40 if you are at a normal weight and have a low risk for diabetes.  More often and at a younger age if you are overweight or have a high risk for diabetes. What should I know about preventing infection? Hepatitis B If you have a higher risk for hepatitis B, you should be screened for this virus. Talk with your health care provider to find out if you are at risk for hepatitis B infection. Hepatitis C Testing is recommended for:  Everyone born from 1945 through 1965.  Anyone with known risk factors for hepatitis C. Sexually transmitted infections (STIs)  Get screened for STIs, including gonorrhea and chlamydia, if: ? You are sexually active and are younger than 47 years of age. ? You are older than 47 years of age and your health care provider tells you that you are at risk for this type of infection. ? Your sexual activity has changed since you were last screened, and you are at increased risk for chlamydia or gonorrhea. Ask your health care provider if   you are at risk.  Ask your health care provider about whether you are at high risk for HIV. Your health care provider may recommend a prescription medicine to help prevent HIV infection. If you choose to take medicine to prevent HIV, you should first get tested for HIV. You should then be tested every 3 months for as long as you are taking the medicine. Pregnancy  If you are about to stop having your period (premenopausal) and  you may become pregnant, seek counseling before you get pregnant.  Take 400 to 800 micrograms (mcg) of folic acid every day if you become pregnant.  Ask for birth control (contraception) if you want to prevent pregnancy. Osteoporosis and menopause Osteoporosis is a disease in which the bones lose minerals and strength with aging. This can result in bone fractures. If you are 65 years old or older, or if you are at risk for osteoporosis and fractures, ask your health care provider if you should:  Be screened for bone loss.  Take a calcium or vitamin D supplement to lower your risk of fractures.  Be given hormone replacement therapy (HRT) to treat symptoms of menopause. Follow these instructions at home: Lifestyle  Do not use any products that contain nicotine or tobacco, such as cigarettes, e-cigarettes, and chewing tobacco. If you need help quitting, ask your health care provider.  Do not use street drugs.  Do not share needles.  Ask your health care provider for help if you need support or information about quitting drugs. Alcohol use  Do not drink alcohol if: ? Your health care provider tells you not to drink. ? You are pregnant, may be pregnant, or are planning to become pregnant.  If you drink alcohol: ? Limit how much you use to 0-1 drink a day. ? Limit intake if you are breastfeeding.  Be aware of how much alcohol is in your drink. In the [place] one drink equals one 12 oz bottle of beer (355 mL), one 5 oz glass of wine (148 mL), or one 1 oz glass of hard liquor (44 mL). General instructions  Schedule regular health, dental, and eye exams.  Stay current with your vaccines.  Tell your health care provider if: ? You often feel depressed. ? You have ever been abused or do not feel safe at home. Summary  Adopting a healthy lifestyle and getting preventive care are important in promoting health and wellness.  Follow your health care provider's instructions about healthy  diet, exercising, and getting tested or screened for diseases.  Follow your health care provider's instructions on monitoring your cholesterol and blood pressure. This information is not intended to replace advice given to you by your health care provider. Make sure you discuss any questions you have with your health care provider. Document Released: 12/03/2010 Document Revised: 05/13/2018 Document Reviewed: 05/13/2018 Elsevier Patient Education  2020 Elsevier Inc.  

## 2019-04-22 NOTE — Progress Notes (Signed)
HPI  Pt presents to the clinic today to reestablish care. She is transferring care from Dr. Ermalene SearingBedsole.   Elevated Blood Pressure: Her BP today is 130/84. She has never been diagnosed with HTN in the past. She denies headaches, dizziness, visual changes, chest pain or chest tightness.   Chronic Low Back Pain: Worse with standing or walking for long periods of time. Her pain improves with sitting or laying down. She has some numbness of BLE but denies tingling. She has some weakness of BLE but denies recent falls. She takes Ibuprofen or Aleve as needed with minimal relief. She had a lumbar xray 8/18, results in Epic. She has not completed physical therapy.  Anxiety: Situational due to COVID 19, family stress. She is not taking any medication for this at this time but has taken something for depression in the past during her divorce. She is not currently seeing a therapist. She depression, SI/HI.  OSA: She averages 8 hours of sleep per night with a mouth device. 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 She does not follow with a pulmonologist.  Flu: never Tetanus: > 10 years Pap Smear: partial hysterectomy, has ovaries. Mammogram: 2014 Vision Screening: annually Dentist: biannually  Past Medical History:  Diagnosis Date  . Anxiety   . Ovarian cyst   . Sleep apnea     Current Outpatient Medications  Medication Sig Dispense Refill  . cephALEXin (KEFLEX) 500 MG capsule Take 1 capsule (500 mg total) by mouth 3 (three) times daily. 21 capsule 0  . traMADol (ULTRAM) 50 MG tablet Take 1 tablet (50 mg total) by mouth every 12 (twelve) hours as needed. 8 tablet 0  . Vitamin D, Ergocalciferol, (DRISDOL) 1.25 MG (50000 UT) CAPS capsule Take 50,000 Units by mouth once a week.  5   No current facility-administered medications for this visit.     No Known Allergies  Family History  Problem Relation Age of Onset  . Hypertension Mother   . Hyperlipidemia  Mother   . Heart disease Mother   . Diabetes Mother   . Asthma Mother   . Cancer Paternal Grandmother        cervical  . Diabetes Paternal Grandfather   . Stroke Paternal Grandfather   . Thyroid disease Mother     Social History   Socioeconomic History  . Marital status: Married    Spouse name: Not on file  . Number of children: Not on file  . Years of education: Not on file  . Highest education level: Not on file  Occupational History  . Not on file  Social Needs  . Financial resource strain: Not on file  . Food insecurity    Worry: Not on file    Inability: Not on file  . Transportation needs    Medical: Not on file    Non-medical: Not on file  Tobacco Use  . Smoking status: Former Smoker    Types: Cigarettes    Quit date: 02/03/2012    Years since quitting: 7.2  . Smokeless tobacco: Never Used  Substance and Sexual Activity  . Alcohol use: No  . Drug use: No  . Sexual activity: Yes    Birth control/protection: Surgical  Lifestyle  . Physical activity    Days per week: Not on file    Minutes per session: Not on file  . Stress: Not on file  Relationships  . Social connections    Talks  on phone: Not on file    Gets together: Not on file    Attends religious service: Not on file    Active member of club or organization: Not on file    Attends meetings of clubs or organizations: Not on file    Relationship status: Not on file  . Intimate partner violence    Fear of current or ex partner: Not on file    Emotionally abused: Not on file    Physically abused: Not on file    Forced sexual activity: Not on file  Other Topics Concern  . Not on file  Social History Narrative  . Not on file    ROS:  Constitutional: Denies fever, malaise, fatigue, headache or abrupt weight changes.  HEENT: Denies eye pain, eye redness, ear pain, ringing in the ears, wax buildup, runny nose, nasal congestion, bloody nose, or sore throat. Respiratory: Denies difficulty breathing,  shortness of breath, cough or sputum production.   Cardiovascular: Denies chest pain, chest tightness, palpitations or swelling in the hands or feet.  Gastrointestinal: Denies abdominal pain, bloating, constipation, diarrhea or blood in the stool.  GU: Denies frequency, urgency, pain with urination, blood in urine, odor or discharge. Musculoskeletal: Pt reports chronic low back pain, intermittent hip pain. Denies decrease in range of motion, difficulty with gait, muscle pain or joint swelling.  Skin: Denies redness, rashes, lesions or ulcercations.  Neurological: Denies dizziness, difficulty with memory, difficulty with speech or problems with balance and coordination.  Psych: Pt reports anxiety. Denies depression, SI/HI.  No other specific complaints in a complete review of systems (except as listed in HPI above).  PE:  BP 130/84   Pulse 81   Temp 97.8 F (36.6 C) (Temporal)   Wt 284 lb (128.8 kg)   LMP 11/02/1997   SpO2 98%   BMI 48.75 kg/m   Wt Readings from Last 3 Encounters:  01/06/17 278 lb (126.1 kg)  07/05/16 272 lb 1.9 oz (123.4 kg)  09/08/15 271 lb (122.9 kg)    General: Appears her stated age, obese, in NAD. Neck: Neck supple, trachea midline. No masses, lumps or thyromegaly present.  Cardiovascular: Normal rate and rhythm. S1,S2 noted.  No murmur, rubs or gallops noted. No JVD or BLE edema.  Pulmonary/Chest: Normal effort and positive vesicular breath sounds. No respiratory distress. No wheezes, rales or ronchi noted.  Musculoskeletal: Decreased flexion and extension of the spine due to pain. Normal rotation. Pain with palpation over the lumbar spine. Strength 5/5 BLE. No difficulty with gait.  Neurological: Alert and oriented.  Psychiatric: Mood and affect normal. Behavior is normal. Judgment and thought content normal.     BMET    Component Value Date/Time   NA 140 09/01/2015 0815   K 3.9 09/01/2015 0815   CL 105 09/01/2015 0815   CO2 29 09/01/2015 0815    GLUCOSE 99 09/01/2015 0815   BUN 11 09/01/2015 0815   CREATININE 0.73 09/01/2015 0815   CREATININE 0.74 12/02/2012 1521   CALCIUM 8.9 09/01/2015 0815   GFRNONAA 100 08/12/2008 1023   GFRAA 121 08/12/2008 1023    Lipid Panel     Component Value Date/Time   CHOL 166 09/01/2015 0815   TRIG 103.0 09/01/2015 0815   HDL 47.50 09/01/2015 0815   CHOLHDL 3 09/01/2015 0815   VLDL 20.6 09/01/2015 0815   LDLCALC 98 09/01/2015 0815    CBC    Component Value Date/Time   WBC 7.9 12/02/2012 1521   RBC 4.60 12/02/2012  1521   HGB 13.5 12/02/2012 1521   HCT 40.1 12/02/2012 1521   PLT 235 12/02/2012 1521   MCV 87.2 12/02/2012 1521   MCH 29.3 12/02/2012 1521   MCHC 33.7 12/02/2012 1521   RDW 14.3 12/02/2012 1521   LYMPHSABS 2.7 12/02/2012 1521   MONOABS 0.5 12/02/2012 1521   EOSABS 0.2 12/02/2012 1521   BASOSABS 0.0 12/02/2012 1521    Hgb A1C Lab Results  Component Value Date   HGBA1C 5.7 07/05/2016     Assessment and Plan:

## 2019-06-10 ENCOUNTER — Encounter: Payer: 59 | Admitting: Internal Medicine

## 2019-06-17 ENCOUNTER — Telehealth: Payer: Self-pay

## 2019-06-17 NOTE — Telephone Encounter (Signed)
I will see her then  

## 2019-06-17 NOTE — Telephone Encounter (Signed)
Pt sent my chart note requesting appt for pt falling down steps and pt can hardly sit down or get up.  I spoke with pt and 2 wks ago pt fell down 3-5 carpeted steps. Pt has pain on both sides of buttock and tailbone. Pt can get in a position laying where does not have a lot of pain. Pt request appt on 06/18/19 at [facility]. Pt has no covid symptoms, no travel and no known exposure to + covid. Pt scheduled in office 30' appt with Dr Milinda Antis on 06/18/19 at 8:30 AM. ED precautions given and pt voiced understanding.FYI to Dr Milinda Antis.

## 2019-06-18 ENCOUNTER — Encounter: Payer: Self-pay | Admitting: Family Medicine

## 2019-06-18 ENCOUNTER — Ambulatory Visit (INDEPENDENT_AMBULATORY_CARE_PROVIDER_SITE_OTHER)
Admission: RE | Admit: 2019-06-18 | Discharge: 2019-06-18 | Disposition: A | Discharge status: Home / Self Care | Payer: 59 | Source: Ambulatory Visit | Attending: Family Medicine | Admitting: Family Medicine

## 2019-06-18 ENCOUNTER — Other Ambulatory Visit: Payer: Self-pay

## 2019-06-18 ENCOUNTER — Ambulatory Visit: Payer: 59 | Admitting: Family Medicine

## 2019-06-18 DIAGNOSIS — M545 Low back pain, unspecified: Secondary | ICD-10-CM

## 2019-06-18 MED ORDER — METHOCARBAMOL 500 MG PO TABS: 500.0000 mg | ORAL_TABLET | Freq: Three times a day (TID) | ORAL | 0 refills | Status: DC | PRN

## 2019-06-18 MED ORDER — MELOXICAM 15 MG PO TABS: 15.0000 mg | ORAL_TABLET | Freq: Every day | ORAL | 0 refills | Status: DC | PRN

## 2019-06-18 NOTE — Patient Instructions (Signed)
Xray today  We will call you with a result /and email  Ice/heat if it helps  Supportive pillow for sitting  Try meloxicam 15 mg daily as needed with food for pain  Use this instead of ibuprofen  Do not mix with any other pain medicines besides tylenol

## 2019-06-18 NOTE — Assessment & Plan Note (Signed)
S/p fall on buttocks  Xray of LS (incl coccyx) Heat/cold if helpful meloxicam 15 mg daily prn with food  Generic robaxin if needed with caution of sedation  Given LS stretches/rehab to try if able  Update if not starting to improve in a week or if worsening

## 2019-06-18 NOTE — Progress Notes (Signed)
Subjective:    Patient ID: Jody Jensen, female    DOB: 10-31-1970, 49 y.o.   MRN: [id number]  HPI 49 yo pt of NP Baity here for injury from a fall  Pain in buttock /tail bone  She was at her son's house - and playing with grandkids Jan 1  Slid down steps on buttocks and hit too hard  Hurt a little  Now worse/hard to get up and down  Tailbone and across buttocks Worse on the L side   No bruising  Tried ace and heat  Used a pillow to sit on with whole in the middle  Also ibuprofen   Patient Active Problem List   Diagnosis Date Noted  . Low back pain 06/18/2019  . Chronic low back pain 04/22/2019  . Anxiety 04/22/2019  . Elevated blood pressure reading 04/22/2019  . OSA (obstructive sleep apnea) 12/02/2012   Past Medical History:  Diagnosis Date  . Anxiety   . Ovarian cyst   . Sleep apnea    Past Surgical History:  Procedure Laterality Date  . ABDOMINAL HYSTERECTOMY      partial, ovaries remain, no cervix  . bone spur Right   . BREAST SURGERY     benign breast cyst removal  . CESAREAN SECTION    . NASAL SINUS SURGERY    . ovarian cyst removal    . TUBAL LIGATION     Social History   Tobacco Use  . Smoking status: Former Smoker    Types: Cigarettes    Quit date: 02/03/2012    Years since quitting: 7.3  . Smokeless tobacco: Never Used  Substance Use Topics  . Alcohol use: Yes    Comment: rare  . Drug use: No   Family History  Problem Relation Age of Onset  . Hypertension Mother   . Hyperlipidemia Mother   . Heart disease Mother   . Diabetes Mother   . Asthma Mother   . Thyroid disease Mother   . Lupus Mother   . Immunodeficiency Mother   . Heart disease Father   . Hypertension Father   . Cancer Paternal Grandmother        cervical  . Diabetes Paternal Grandfather   . Stroke Paternal Grandfather   . Diabetes Paternal Aunt   . Prostate cancer Paternal Uncle   . Heart disease Paternal Uncle    No Known Allergies Current Outpatient Medications  on File Prior to Visit  Medication Sig Dispense Refill  . Cholecalciferol (HM VITAMIN D3) 50 MCG (2000 UT) CAPS Take by mouth.    . venlafaxine XR (EFFEXOR-XR) 37.5 MG 24 hr capsule Take 1 capsule (37.5 mg total) by mouth daily with breakfast. 30 capsule 2   No current facility-administered medications on file prior to visit.    Review of Systems  Constitutional: Negative for activity change, appetite change, fatigue, fever and unexpected weight change.  HENT: Negative for congestion, ear pain, rhinorrhea, sinus pressure and sore throat.   Eyes: Negative for pain, redness and visual disturbance.  Respiratory: Negative for cough, shortness of breath and wheezing.   Cardiovascular: Negative for chest pain and palpitations.  Gastrointestinal: Negative for abdominal pain, blood in stool, constipation and diarrhea.  Endocrine: Negative for polydipsia and polyuria.  Genitourinary: Negative for dysuria, frequency and urgency.  Musculoskeletal: Positive for back pain. Negative for arthralgias and myalgias.       Acute on chronic  Now pain in coccyx area after a fall   Skin: Negative for  pallor and rash.  Allergic/Immunologic: Negative for environmental allergies.  Neurological: Negative for dizziness, syncope and headaches.  Hematological: Negative for adenopathy. Does not bruise/bleed easily.  Psychiatric/Behavioral: Negative for decreased concentration and dysphoric mood. The patient is not nervous/anxious.        Objective:   Physical Exam Constitutional:      Appearance: Normal appearance. She is obese. She is not ill-appearing or diaphoretic.  Eyes:     Extraocular Movements: Extraocular movements intact.     Conjunctiva/sclera: Conjunctivae normal.     Pupils: Pupils are equal, round, and reactive to light.  Cardiovascular:     Rate and Rhythm: Normal rate and regular rhythm.  Pulmonary:     Effort: Pulmonary effort is normal. No respiratory distress.     Breath sounds: Normal  breath sounds. No wheezing or rales.  Musculoskeletal:     Cervical back: Neck supple. No tenderness.     Lumbar back: Tenderness and bony tenderness present. No swelling or deformity. Decreased range of motion. Negative right straight leg raise test and negative left straight leg raise test.     Comments: Tenderness over low LS/coccyx area (no crepitus or step off noted)  L sided lumbar muscular tenderness    Skin:    General: Skin is warm and dry.     Coloration: Skin is not jaundiced or pale.     Findings: No bruising, erythema or lesion.  Neurological:     Mental Status: She is alert.     Sensory: No sensory deficit.     Motor: No weakness.     Gait: Gait normal.     Deep Tendon Reflexes: Reflexes normal.  Psychiatric:        Mood and Affect: Mood normal.           Assessment & Plan:   Problem List Items Addressed This Visit      Other   Low back pain    S/p fall on buttocks  Xray of LS (incl coccyx) Heat/cold if helpful meloxicam 15 mg daily prn with food  Generic robaxin if needed with caution of sedation  Given LS stretches/rehab to try if able  Update if not starting to improve in a week or if worsening        Relevant Medications   meloxicam (MOBIC) 15 MG tablet   methocarbamol (ROBAXIN) 500 MG tablet   Other Relevant Orders   DG Lumbar Spine Complete

## 2019-07-16 ENCOUNTER — Encounter: Payer: Self-pay | Admitting: Internal Medicine

## 2019-07-16 ENCOUNTER — Ambulatory Visit (INDEPENDENT_AMBULATORY_CARE_PROVIDER_SITE_OTHER): Payer: 59 | Admitting: Internal Medicine

## 2019-07-16 ENCOUNTER — Other Ambulatory Visit: Payer: Self-pay

## 2019-07-16 VITALS — BP 134/82 | HR 81 | Temp 98.1°F | Ht 64.0 in | Wt 287.0 lb

## 2019-07-16 DIAGNOSIS — Z1231 Encounter for screening mammogram for malignant neoplasm of breast: Secondary | ICD-10-CM

## 2019-07-16 DIAGNOSIS — Z0000 Encounter for general adult medical examination without abnormal findings: Secondary | ICD-10-CM

## 2019-07-16 DIAGNOSIS — R232 Flushing: Secondary | ICD-10-CM

## 2019-07-16 DIAGNOSIS — I10 Essential (primary) hypertension: Secondary | ICD-10-CM

## 2019-07-16 DIAGNOSIS — Z1211 Encounter for screening for malignant neoplasm of colon: Secondary | ICD-10-CM

## 2019-07-16 DIAGNOSIS — Z832 Family history of diseases of the blood and blood-forming organs and certain disorders involving the immune mechanism: Secondary | ICD-10-CM | POA: Diagnosis not present

## 2019-07-16 DIAGNOSIS — E010 Iodine-deficiency related diffuse (endemic) goiter: Secondary | ICD-10-CM

## 2019-07-16 DIAGNOSIS — Z23 Encounter for immunization: Secondary | ICD-10-CM | POA: Diagnosis not present

## 2019-07-16 MED ORDER — HYDROCHLOROTHIAZIDE 25 MG PO TABS: 25.0000 mg | ORAL_TABLET | Freq: Every day | ORAL | 2 refills | Status: DC

## 2019-07-16 NOTE — Progress Notes (Signed)
Subjective:    Patient ID: Jody Jensen, female    DOB: [birth year], 49 y.o.   MRN: [id number]  HPI  Pt presents to the clinic today for her annual exam.  She would like her ANA checked. Her mother has lupus and other autoimmune disorders. She intermittently has joint and muscle pain but no specific joint pain or swelling. She has not noticed rashes on her face or over her joints.   Of note, her BP today is 134/82. She has never been diagnosed with HTN. She c/o intermittent lightheadedness with laughing but otherwise, denies headaches, visual changes or chest pain.   Flu: 04/2019 Tetanus: > 10 years ago Pap Smear: partial hysterectomy Mammogram: > 5 years ago Colon Screening: never Vision Screening: as needed Dentist: biannually  Diet: She does eat meat. She consumes fruits and veggies daily. She tries to avoid fried foods. She drinks mostly water, sweet tea or Coke Zero. Exercise: None  Review of Systems  Past Medical History:  Diagnosis Date  . Anxiety   . Ovarian cyst   . Sleep apnea     Current Outpatient Medications  Medication Sig Dispense Refill  . Cholecalciferol (HM VITAMIN D3) 50 MCG (2000 UT) CAPS Take by mouth.    . meloxicam (MOBIC) 15 MG tablet Take 1 tablet (15 mg total) by mouth daily as needed for pain (back pain). Take with food 30 tablet 0  . methocarbamol (ROBAXIN) 500 MG tablet Take 1 tablet (500 mg total) by mouth every 8 (eight) hours as needed for muscle spasms. Caution of sedation 30 tablet 0  . venlafaxine XR (EFFEXOR-XR) 37.5 MG 24 hr capsule Take 1 capsule (37.5 mg total) by mouth daily with breakfast. 30 capsule 2   No current facility-administered medications for this visit.    No Known Allergies  Family History  Problem Relation Age of Onset  . Hypertension Mother   . Hyperlipidemia Mother   . Heart disease Mother   . Diabetes Mother   . Asthma Mother   . Thyroid disease Mother   . Lupus Mother   . Immunodeficiency Mother   .  Heart disease Father   . Hypertension Father   . Cancer Paternal Grandmother        cervical  . Diabetes Paternal Grandfather   . Stroke Paternal Grandfather   . Diabetes Paternal Aunt   . Prostate cancer Paternal Uncle   . Heart disease Paternal Uncle     Social History   Socioeconomic History  . Marital status: Married    Spouse name: Not on file  . Number of children: Not on file  . Years of education: Not on file  . Highest education level: Not on file  Occupational History  . Not on file  Tobacco Use  . Smoking status: Former Smoker    Types: Cigarettes    Quit date: 02/03/2012    Years since quitting: 7.4  . Smokeless tobacco: Never Used  Substance and Sexual Activity  . Alcohol use: Yes    Comment: rare  . Drug use: No  . Sexual activity: Yes    Birth control/protection: Surgical  Other Topics Concern  . Not on file  Social History Narrative  . Not on file   Social Determinants of Health   Financial Resource Strain:   . Difficulty of Paying Living Expenses: Not on file  Food Insecurity:   . Worried About Charity fundraiser in the Last Year: Not on file  . Ran Out  of Food in the Last Year: Not on file  Transportation Needs:   . Lack of Transportation (Medical): Not on file  . Lack of Transportation (Non-Medical): Not on file  Physical Activity:   . Days of Exercise per Week: Not on file  . Minutes of Exercise per Session: Not on file  Stress:   . Feeling of Stress : Not on file  Social Connections:   . Frequency of Communication with Friends and Family: Not on file  . Frequency of Social Gatherings with Friends and Family: Not on file  . Attends Religious Services: Not on file  . Active Member of Clubs or Organizations: Not on file  . Attends Banker Meetings: Not on file  . Marital Status: Not on file  Intimate Partner Violence:   . Fear of Current or Ex-Partner: Not on file  . Emotionally Abused: Not on file  . Physically Abused: Not  on file  . Sexually Abused: Not on file     Constitutional: Denies fever, malaise, fatigue, headache or abrupt weight changes.  HEENT: Denies eye pain, eye redness, ear pain, ringing in the ears, wax buildup, runny nose, nasal congestion, bloody nose, or sore throat. Respiratory: Denies difficulty breathing, shortness of breath, cough or sputum production.   Cardiovascular: Pt reports swelling of legs. Denies chest pain, chest tightness, palpitations or swelling in the hands or feet.  Gastrointestinal: Denies abdominal pain, bloating, constipation, diarrhea or blood in the stool.  GU: Denies urgency, frequency, pain with urination, burning sensation, blood in urine, odor or discharge. Musculoskeletal: Pt reports chronic low back pain, intermittent muscle and joint pain. Denies decrease in range of motion, difficulty with gait, or joint swelling.  Skin: Denies redness, rashes, lesions or ulcercations.  Neurological: Pt reports hot flashes. Denies dizziness, difficulty with memory, difficulty with speech or problems with balance and coordination.  Psych: Denies anxiety, depression, SI/HI.  No other specific complaints in a complete review of systems (except as listed in HPI above).     Objective:   Physical Exam  BP 134/82   Pulse 81   Temp 98.1 F (36.7 C) (Temporal)   Ht 5\' 4"  (1.626 m)   Wt 287 lb (130.2 kg)   LMP 11/02/1997   SpO2 98%   BMI 49.26 kg/m   Wt Readings from Last 3 Encounters:  06/18/19 283 lb 4 oz (128.5 kg)  04/22/19 284 lb (128.8 kg)  01/06/17 278 lb (126.1 kg)    General: Appears her stated age, obese, in NAD. Skin: Warm, dry and intact. No rashes noted. HEENT: Head: normal shape and size; Eyes: sclera white, no icterus, conjunctiva pink, PERRLA and EOMs intact;  Neck:  Neck supple, trachea midline. No masses, lumps present. Thyromegaly noted. Cardiovascular: Normal rate and rhythm. S1,S2 noted.  No murmur, rubs or gallops noted. 1+  BLE  edema. Pulmonary/Chest: Normal effort and positive vesicular breath sounds. No respiratory distress. No wheezes, rales or ronchi noted.  Abdomen: Soft and nontender. Normal bowel sounds. No distention or masses noted. Liver, spleen and kidneys non palpable. Musculoskeletal: Strength 5/5 BUE/BLE. No difficulty with gait.  Neurological: Alert and oriented. Cranial nerves II-XII grossly intact. Coordination normal.  Psychiatric: Mood and affect normal. Behavior is normal. Judgment and thought content normal.     BMET    Component Value Date/Time   NA 140 09/01/2015 0815   K 3.9 09/01/2015 0815   CL 105 09/01/2015 0815   CO2 29 09/01/2015 0815   GLUCOSE 99 09/01/2015  0815   BUN 11 09/01/2015 0815   CREATININE 0.73 09/01/2015 0815   CREATININE 0.74 12/02/2012 1521   CALCIUM 8.9 09/01/2015 0815   GFRNONAA 100 08/12/2008 1023   GFRAA 121 08/12/2008 1023    Lipid Panel     Component Value Date/Time   CHOL 166 09/01/2015 0815   TRIG 103.0 09/01/2015 0815   HDL 47.50 09/01/2015 0815   CHOLHDL 3 09/01/2015 0815   VLDL 20.6 09/01/2015 0815   LDLCALC 98 09/01/2015 0815    CBC    Component Value Date/Time   WBC 7.9 12/02/2012 1521   RBC 4.60 12/02/2012 1521   HGB 13.5 12/02/2012 1521   HCT 40.1 12/02/2012 1521   PLT 235 12/02/2012 1521   MCV 87.2 12/02/2012 1521   MCH 29.3 12/02/2012 1521   MCHC 33.7 12/02/2012 1521   RDW 14.3 12/02/2012 1521   LYMPHSABS 2.7 12/02/2012 1521   MONOABS 0.5 12/02/2012 1521   EOSABS 0.2 12/02/2012 1521   BASOSABS 0.0 12/02/2012 1521    Hgb A1C Lab Results  Component Value Date   HGBA1C 5.7 07/05/2016            Assessment & Plan:   Preventative Health Maintenance:  Flu shot UTD Tdap today She no longer needs pap smears Mammogram ordered, she will call [facility] to schedule- number provided Referral to GI for colon cancer screening Encouraged her to consume a balanced diet and exercise regimen Advised her to see an eye doctor  and dentist annually Will check CBC, CMET, Lipid, A1C, FSH/LH and Vit D today  Thyromegaly:  TSH today  Family History of Autoimmune Disorder:  ANA today  RTC in 1 month for BP check, repeat BMET. Nicki Reaper, NP This visit occurred during the SARS-CoV-2 public health emergency.  Safety protocols were in place, including screening questions prior to the visit, additional usage of staff PPE, and extensive cleaning of exam room while observing appropriate contact time as indicated for disinfecting solutions.

## 2019-07-16 NOTE — Patient Instructions (Signed)
Health Maintenance, Female Adopting a healthy lifestyle and getting preventive care are important in promoting health and wellness. Ask your health care provider about:  The right schedule for you to have regular tests and exams.  Things you can do on your own to prevent diseases and keep yourself healthy. What should I know about diet, weight, and exercise? Eat a healthy diet   Eat a diet that includes plenty of vegetables, fruits, low-fat dairy products, and lean protein.  Do not eat a lot of foods that are high in solid fats, added sugars, or sodium. Maintain a healthy weight Body mass index (BMI) is used to identify weight problems. It estimates body fat based on height and weight. Your health care provider can help determine your BMI and help you achieve or maintain a healthy weight. Get regular exercise Get regular exercise. This is one of the most important things you can do for your health. Most adults should:  Exercise for at least 150 minutes each week. The exercise should increase your heart rate and make you sweat (moderate-intensity exercise).  Do strengthening exercises at least twice a week. This is in addition to the moderate-intensity exercise.  Spend less time sitting. Even light physical activity can be beneficial. Watch cholesterol and blood lipids Have your blood tested for lipids and cholesterol at 48 years of age, then have this test every 5 years. Have your cholesterol levels checked more often if:  Your lipid or cholesterol levels are high.  You are older than 48 years of age.  You are at high risk for heart disease. What should I know about cancer screening? Depending on your health history and family history, you may need to have cancer screening at various ages. This may include screening for:  Breast cancer.  Cervical cancer.  Colorectal cancer.  Skin cancer.  Lung cancer. What should I know about heart disease, diabetes, and high blood  pressure? Blood pressure and heart disease  High blood pressure causes heart disease and increases the risk of stroke. This is more likely to develop in people who have high blood pressure readings, are of African descent, or are overweight.  Have your blood pressure checked: ? Every 3-5 years if you are 18-39 years of age. ? Every year if you are 40 years old or older. Diabetes Have regular diabetes screenings. This checks your fasting blood sugar level. Have the screening done:  Once every three years after age 40 if you are at a normal weight and have a low risk for diabetes.  More often and at a younger age if you are overweight or have a high risk for diabetes. What should I know about preventing infection? Hepatitis B If you have a higher risk for hepatitis B, you should be screened for this virus. Talk with your health care provider to find out if you are at risk for hepatitis B infection. Hepatitis C Testing is recommended for:  Everyone born from 1945 through 1965.  Anyone with known risk factors for hepatitis C. Sexually transmitted infections (STIs)  Get screened for STIs, including gonorrhea and chlamydia, if: ? You are sexually active and are younger than 48 years of age. ? You are older than 48 years of age and your health care provider tells you that you are at risk for this type of infection. ? Your sexual activity has changed since you were last screened, and you are at increased risk for chlamydia or gonorrhea. Ask your health care provider if   you are at risk.  Ask your health care provider about whether you are at high risk for HIV. Your health care provider may recommend a prescription medicine to help prevent HIV infection. If you choose to take medicine to prevent HIV, you should first get tested for HIV. You should then be tested every 3 months for as long as you are taking the medicine. Pregnancy  If you are about to stop having your period (premenopausal) and  you may become pregnant, seek counseling before you get pregnant.  Take 400 to 800 micrograms (mcg) of folic acid every day if you become pregnant.  Ask for birth control (contraception) if you want to prevent pregnancy. Osteoporosis and menopause Osteoporosis is a disease in which the bones lose minerals and strength with aging. This can result in bone fractures. If you are 65 years old or older, or if you are at risk for osteoporosis and fractures, ask your health care provider if you should:  Be screened for bone loss.  Take a calcium or vitamin D supplement to lower your risk of fractures.  Be given hormone replacement therapy (HRT) to treat symptoms of menopause. Follow these instructions at home: Lifestyle  Do not use any products that contain nicotine or tobacco, such as cigarettes, e-cigarettes, and chewing tobacco. If you need help quitting, ask your health care provider.  Do not use street drugs.  Do not share needles.  Ask your health care provider for help if you need support or information about quitting drugs. Alcohol use  Do not drink alcohol if: ? Your health care provider tells you not to drink. ? You are pregnant, may be pregnant, or are planning to become pregnant.  If you drink alcohol: ? Limit how much you use to 0-1 drink a day. ? Limit intake if you are breastfeeding.  Be aware of how much alcohol is in your drink. In the [place] one drink equals one 12 oz bottle of beer (355 mL), one 5 oz glass of wine (148 mL), or one 1 oz glass of hard liquor (44 mL). General instructions  Schedule regular health, dental, and eye exams.  Stay current with your vaccines.  Tell your health care provider if: ? You often feel depressed. ? You have ever been abused or do not feel safe at home. Summary  Adopting a healthy lifestyle and getting preventive care are important in promoting health and wellness.  Follow your health care provider's instructions about healthy  diet, exercising, and getting tested or screened for diseases.  Follow your health care provider's instructions on monitoring your cholesterol and blood pressure. This information is not intended to replace advice given to you by your health care provider. Make sure you discuss any questions you have with your health care provider. Document Revised: 05/13/2018 Document Reviewed: 05/13/2018 Elsevier Patient Education  2020 [employer]  

## 2019-07-16 NOTE — Addendum Note (Signed)
Addended by: Roena Malady on: 07/16/2019 04:25 PM   Modules accepted: Orders

## 2019-07-16 NOTE — Assessment & Plan Note (Signed)
Will start HCTZ 25 mg PO daily Reinforced DASH diet, exercise for weight loss CMET today

## 2019-07-18 LAB — FOLLICLE STIMULATING HORMONE: FSH: 53.8 m[IU]/mL

## 2019-07-18 LAB — COMPREHENSIVE METABOLIC PANEL
AG Ratio: 2.1 (calc) (ref 1.0–2.5)
ALT: 27 U/L (ref 6–29)
AST: 19 U/L (ref 10–35)
Albumin: 4.2 g/dL (ref 3.6–5.1)
Alkaline phosphatase (APISO): 80 U/L (ref 31–125)
BUN: 13 mg/dL (ref 7–25)
CO2: 27 mmol/L (ref 20–32)
Calcium: 9 mg/dL (ref 8.6–10.2)
Chloride: 103 mmol/L (ref 98–110)
Creat: 0.75 mg/dL (ref 0.50–1.10)
Globulin: 2 g/dL (calc) (ref 1.9–3.7)
Glucose, Bld: 83 mg/dL (ref 65–99)
Potassium: 3.9 mmol/L (ref 3.5–5.3)
Sodium: 140 mmol/L (ref 135–146)
Total Bilirubin: 0.5 mg/dL (ref 0.2–1.2)
Total Protein: 6.2 g/dL (ref 6.1–8.1)

## 2019-07-18 LAB — ANA: Anti Nuclear Antibody (ANA): NEGATIVE

## 2019-07-18 LAB — HEMOGLOBIN A1C
Hgb A1c MFr Bld: 5.8 % of total Hgb — ABNORMAL HIGH (ref <5.7)
Mean Plasma Glucose: 120 (calc)
eAG (mmol/L): 6.6 (calc)

## 2019-07-18 LAB — CBC
HCT: 40.5 % (ref 35.0–45.0)
Hemoglobin: 13.5 g/dL (ref 11.7–15.5)
MCH: 29.3 pg (ref 27.0–33.0)
MCHC: 33.3 g/dL (ref 32.0–36.0)
MCV: 88 fL (ref 80.0–100.0)
MPV: 10.3 fL (ref 7.5–12.5)
Platelets: 181 10*3/uL (ref 140–400)
RBC: 4.6 10*6/uL (ref 3.80–5.10)
RDW: 13.4 % (ref 11.0–15.0)
WBC: 5.6 10*3/uL (ref 3.8–10.8)

## 2019-07-18 LAB — LIPID PANEL
Cholesterol: 187 mg/dL (ref <200)
HDL: 53 mg/dL (ref >=50)
LDL Cholesterol (Calc): 114 mg/dL (calc) — ABNORMAL HIGH
Non-HDL Cholesterol (Calc): 134 mg/dL (calc) — ABNORMAL HIGH (ref <130)
Total CHOL/HDL Ratio: 3.5 (calc) (ref <5.0)
Triglycerides: 95 mg/dL (ref <150)

## 2019-07-18 LAB — TSH: TSH: 1.18 mIU/L

## 2019-07-18 LAB — VITAMIN D 25 HYDROXY (VIT D DEFICIENCY, FRACTURES): Vit D, 25-Hydroxy: 21 ng/mL — ABNORMAL LOW (ref 30–100)

## 2019-07-18 LAB — LUTEINIZING HORMONE: LH: 33.5 m[IU]/mL

## 2019-07-19 ENCOUNTER — Encounter: Payer: Self-pay | Admitting: Internal Medicine

## 2019-07-25 ENCOUNTER — Other Ambulatory Visit: Payer: Self-pay | Admitting: Internal Medicine

## 2019-07-27 NOTE — Telephone Encounter (Signed)
Pt calling to check on rx  And also needs a refill vit D  Pt is out of out of meds  [facility] [address]

## 2019-07-27 NOTE — Telephone Encounter (Signed)
She can do 50000 units monthly # 12 0 refills

## 2019-07-27 NOTE — Telephone Encounter (Signed)
Patient called back checking on this.   I asked patient about Vitamin D RX request and she states she use to be on that and then went back to OTC vitamin d but does not feel like that is working as well and wanted to go back on the RX Vitamin D once a week and wanted to know if [facility]oc[facility] would be willing to fill that

## 2019-07-29 MED ORDER — VITAMIN D (ERGOCALCIFEROL) 1.25 MG (50000 UNIT) PO CAPS: 50000.0000 [IU] | ORAL_CAPSULE | ORAL | 2 refills | Status: DC

## 2019-07-29 NOTE — Telephone Encounter (Signed)
Rx sent through [facility]  

## 2019-08-01 ENCOUNTER — Emergency Department (HOSPITAL_COMMUNITY)
Admission: EM | Admit: 2019-08-01 | Discharge: 2019-08-01 | Disposition: A | Discharge status: Home / Self Care | Payer: 59 | Attending: Emergency Medicine | Admitting: Emergency Medicine

## 2019-08-01 ENCOUNTER — Emergency Department (HOSPITAL_COMMUNITY): Payer: 59

## 2019-08-01 ENCOUNTER — Other Ambulatory Visit: Payer: Self-pay

## 2019-08-01 ENCOUNTER — Encounter (HOSPITAL_COMMUNITY): Payer: Self-pay | Admitting: Emergency Medicine

## 2019-08-01 DIAGNOSIS — R0789 Other chest pain: Secondary | ICD-10-CM | POA: Diagnosis not present

## 2019-08-01 DIAGNOSIS — Z5321 Procedure and treatment not carried out due to patient leaving prior to being seen by health care provider: Secondary | ICD-10-CM | POA: Insufficient documentation

## 2019-08-01 DIAGNOSIS — R110 Nausea: Secondary | ICD-10-CM | POA: Insufficient documentation

## 2019-08-01 DIAGNOSIS — R0602 Shortness of breath: Secondary | ICD-10-CM | POA: Diagnosis not present

## 2019-08-01 HISTORY — DX: Type 2 diabetes mellitus without complications: E11.9

## 2019-08-01 LAB — CBC
HCT: 42.8 % (ref 36.0–46.0)
Hemoglobin: 14.1 g/dL (ref 12.0–15.0)
MCH: 29.3 pg (ref 26.0–34.0)
MCHC: 32.9 g/dL (ref 30.0–36.0)
MCV: 88.8 fL (ref 80.0–100.0)
Platelets: 211 10*3/uL (ref 150–400)
RBC: 4.82 MIL/uL (ref 3.87–5.11)
RDW: 13.3 % (ref 11.5–15.5)
WBC: 7.2 10*3/uL (ref 4.0–10.5)
nRBC: 0 % (ref 0.0–0.2)

## 2019-08-01 LAB — BASIC METABOLIC PANEL
Anion gap: 12 (ref 5–15)
BUN: 17 mg/dL (ref 6–20)
CO2: 27 mmol/L (ref 22–32)
Calcium: 9 mg/dL (ref 8.9–10.3)
Chloride: 100 mmol/L (ref 98–111)
Creatinine, Ser: 0.87 mg/dL (ref 0.44–1.00)
GFR calc Af Amer: >60 mL/min (ref >60)
GFR calc non Af Amer: >60 mL/min (ref >60)
Glucose, Bld: 122 mg/dL — ABNORMAL HIGH (ref 70–99)
Potassium: 3.4 mmol/L — ABNORMAL LOW (ref 3.5–5.1)
Sodium: 139 mmol/L (ref 135–145)

## 2019-08-01 LAB — I-STAT BETA HCG BLOOD, ED (MC, WL, AP ONLY): I-stat hCG, quantitative: <5 m[IU]/mL (ref <5)

## 2019-08-01 LAB — PROTIME-INR
INR: 1 (ref 0.8–1.2)
Prothrombin Time: 12.9 seconds (ref 11.4–15.2)

## 2019-08-01 LAB — TROPONIN I (HIGH SENSITIVITY): Troponin I (High Sensitivity): 3 ng/L (ref <18)

## 2019-08-01 MED ORDER — SODIUM CHLORIDE 0.9% FLUSH: 3.0000 mL | Freq: Once | INTRAVENOUS | Status: DC

## 2019-08-01 NOTE — ED Notes (Signed)
Jody Jensen [phone], please call with updates

## 2019-08-01 NOTE — ED Notes (Signed)
Pt stated desire to leave r/t feeling better. This nurse explained dangers of leaving AMA, advised to come back if symptoms worsen.

## 2019-08-01 NOTE — ED Triage Notes (Signed)
Patient reports intermittent central chest pain this evening with SOB , nausea and diaphoresis , no cough or fever .

## 2019-08-12 ENCOUNTER — Telehealth: Payer: Self-pay | Admitting: Internal Medicine

## 2019-08-12 MED ORDER — ALPRAZOLAM 0.25 MG PO TABS: 0.2500 mg | ORAL_TABLET | Freq: Every day | ORAL | 0 refills | Status: DC | PRN

## 2019-08-12 NOTE — Addendum Note (Signed)
Addended by: Lorre Munroe on: 08/12/2019 08:59 AM   Modules accepted: Orders

## 2019-08-12 NOTE — Telephone Encounter (Signed)
RX for limited quantity Xanax sent to pharmacy. Please give her my condolences.

## 2019-08-12 NOTE — Telephone Encounter (Signed)
Patient called today She stated that her Daughter in law passed away last night and her nerves have been all over the place. She wanted to know if there is something can could be called in to help

## 2019-08-12 NOTE — Telephone Encounter (Signed)
Spoke to pt and she is aware as instructed 

## 2019-08-13 ENCOUNTER — Ambulatory Visit: Payer: 59 | Admitting: Internal Medicine

## 2019-08-27 ENCOUNTER — Ambulatory Visit
Admission: RE | Admit: 2019-08-27 | Discharge: 2019-08-27 | Disposition: A | Discharge status: Home / Self Care | Payer: 59 | Source: Ambulatory Visit | Attending: Internal Medicine | Admitting: Internal Medicine

## 2019-08-27 DIAGNOSIS — Z1231 Encounter for screening mammogram for malignant neoplasm of breast: Secondary | ICD-10-CM | POA: Insufficient documentation

## 2020-02-17 ENCOUNTER — Emergency Department (HOSPITAL_COMMUNITY): Payer: 59

## 2020-02-17 ENCOUNTER — Other Ambulatory Visit: Payer: Self-pay

## 2020-02-17 ENCOUNTER — Emergency Department (HOSPITAL_COMMUNITY)
Admission: EM | Admit: 2020-02-17 | Discharge: 2020-02-17 | Disposition: A | Discharge status: Home / Self Care | Payer: 59 | Attending: Emergency Medicine | Admitting: Emergency Medicine

## 2020-02-17 ENCOUNTER — Encounter (HOSPITAL_COMMUNITY): Payer: Self-pay | Admitting: Emergency Medicine

## 2020-02-17 DIAGNOSIS — Z79899 Other long term (current) drug therapy: Secondary | ICD-10-CM | POA: Insufficient documentation

## 2020-02-17 DIAGNOSIS — R110 Nausea: Secondary | ICD-10-CM | POA: Diagnosis not present

## 2020-02-17 DIAGNOSIS — E119 Type 2 diabetes mellitus without complications: Secondary | ICD-10-CM | POA: Insufficient documentation

## 2020-02-17 DIAGNOSIS — R072 Precordial pain: Secondary | ICD-10-CM | POA: Diagnosis not present

## 2020-02-17 DIAGNOSIS — I10 Essential (primary) hypertension: Secondary | ICD-10-CM | POA: Insufficient documentation

## 2020-02-17 DIAGNOSIS — Z87891 Personal history of nicotine dependence: Secondary | ICD-10-CM | POA: Insufficient documentation

## 2020-02-17 DIAGNOSIS — R61 Generalized hyperhidrosis: Secondary | ICD-10-CM | POA: Insufficient documentation

## 2020-02-17 LAB — CBC
HCT: 41 % (ref 36.0–46.0)
Hemoglobin: 13.1 g/dL (ref 12.0–15.0)
MCH: 29.2 pg (ref 26.0–34.0)
MCHC: 32 g/dL (ref 30.0–36.0)
MCV: 91.5 fL (ref 80.0–100.0)
Platelets: 187 10*3/uL (ref 150–400)
RBC: 4.48 MIL/uL (ref 3.87–5.11)
RDW: 14.2 % (ref 11.5–15.5)
WBC: 8.1 10*3/uL (ref 4.0–10.5)
nRBC: 0 % (ref 0.0–0.2)

## 2020-02-17 LAB — COMPREHENSIVE METABOLIC PANEL
ALT: 67 U/L — ABNORMAL HIGH (ref 0–44)
AST: 79 U/L — ABNORMAL HIGH (ref 15–41)
Albumin: 3.8 g/dL (ref 3.5–5.0)
Alkaline Phosphatase: 87 U/L (ref 38–126)
Anion gap: 11 (ref 5–15)
BUN: 15 mg/dL (ref 6–20)
CO2: 26 mmol/L (ref 22–32)
Calcium: 9.1 mg/dL (ref 8.9–10.3)
Chloride: 103 mmol/L (ref 98–111)
Creatinine, Ser: 0.91 mg/dL (ref 0.44–1.00)
GFR calc Af Amer: >60 mL/min (ref >60)
GFR calc non Af Amer: >60 mL/min (ref >60)
Glucose, Bld: 134 mg/dL — ABNORMAL HIGH (ref 70–99)
Potassium: 3.8 mmol/L (ref 3.5–5.1)
Sodium: 140 mmol/L (ref 135–145)
Total Bilirubin: 0.6 mg/dL (ref 0.3–1.2)
Total Protein: 6.2 g/dL — ABNORMAL LOW (ref 6.5–8.1)

## 2020-02-17 LAB — I-STAT CHEM 8, ED
BUN: 20 mg/dL (ref 6–20)
Calcium, Ion: 1.13 mmol/L — ABNORMAL LOW (ref 1.15–1.40)
Chloride: 102 mmol/L (ref 98–111)
Creatinine, Ser: 0.9 mg/dL (ref 0.44–1.00)
Glucose, Bld: 131 mg/dL — ABNORMAL HIGH (ref 70–99)
HCT: 39 % (ref 36.0–46.0)
Hemoglobin: 13.3 g/dL (ref 12.0–15.0)
Potassium: 3.5 mmol/L (ref 3.5–5.1)
Sodium: 139 mmol/L (ref 135–145)
TCO2: 27 mmol/L (ref 22–32)

## 2020-02-17 LAB — LIPASE, BLOOD: Lipase: 32 U/L (ref 11–51)

## 2020-02-17 LAB — TROPONIN I (HIGH SENSITIVITY)
Troponin I (High Sensitivity): 3 ng/L (ref <18)
Troponin I (High Sensitivity): 3 ng/L (ref <18)

## 2020-02-17 LAB — MAGNESIUM: Magnesium: 2.1 mg/dL (ref 1.7–2.4)

## 2020-02-17 MED ORDER — FAMOTIDINE 20 MG PO TABS: 20.0000 mg | ORAL_TABLET | Freq: Two times a day (BID) | ORAL | 0 refills | Status: DC

## 2020-02-17 MED ORDER — ALUM & MAG HYDROXIDE-SIMETH 200-200-20 MG/5ML PO SUSP
30.0000 mL | Freq: Once | ORAL | Status: AC
  Administered 2020-02-17: 30 mL via ORAL
  Filled 2020-02-17: qty 30

## 2020-02-17 MED ORDER — ACETAMINOPHEN 325 MG PO TABS
650.0000 mg | ORAL_TABLET | Freq: Once | ORAL | Status: AC
  Administered 2020-02-17: 650 mg via ORAL
  Filled 2020-02-17: qty 2

## 2020-02-17 MED ORDER — LIDOCAINE VISCOUS HCL 2 % MT SOLN
15.0000 mL | Freq: Once | OROMUCOSAL | Status: AC
  Administered 2020-02-17: 15 mL via ORAL
  Filled 2020-02-17: qty 15

## 2020-02-17 MED ORDER — ASPIRIN 81 MG PO CHEW
324.0000 mg | CHEWABLE_TABLET | Freq: Once | ORAL | Status: DC
  Filled 2020-02-17: qty 4

## 2020-02-17 NOTE — ED Triage Notes (Signed)
Patient with epigastric pain/chest pain that started when she was cooking dinner.  She called EMS, en route to ED she was given 1sl nitro that dropped her BP, she got sweaty and nauseated.  Patient was given 4mg  zofran and 324mg  ASA and 4mg  morphine for pain.  Pain is now a 4/10 and started at 6/10.  She describes the pain as a pressure.

## 2020-02-17 NOTE — ED Provider Notes (Signed)
[facility]   CSN: [id number] Arrival date & time: 02/17/20  2007     History Chief Complaint  Patient presents with  . Chest Pain    Jody Jensen is a 49 y.o. female with past medical history of morbid obesity, non-insulin-dependent diabetes presents the ED today for an episode of chest pain.  Patient states that this began relatively suddenly an hour or so prior to presentation, she describes it as sharp from her substernal area radiating to her back associate with nausea and diaphoresis.  Was gradually improving by EMS arrival.  Administered aspirin and nitroglycerin with significant worsening briefly.  They report blood pressure dropping from about 160 systolic to about 120 with nitroglycerin.  Patient states pain is much improved at this time.  No history of similar pain.  No shortness of breath or leg swelling.  Does not smoke but does vape.   Chest Pain Associated symptoms: diaphoresis and nausea   Associated symptoms: no abdominal pain, no cough, no dizziness, no fever, no headache, no palpitations, no shortness of breath and no vomiting     HPI: A 49 year old patient with a history of treated diabetes and obesity presents for evaluation of chest pain. Initial onset of pain was less than one hour ago. The patient's chest pain is sharp and is not worse with exertion. The patient complains of nausea and reports some diaphoresis. The patient's chest pain is middle- or left-sided, is not well-localized, is not described as heaviness/pressure/tightness and does not radiate to the arms/jaw/neck. The patient has no history of stroke, has no history of peripheral artery disease, has not smoked in the past 90 days, has no relevant family history of coronary artery disease (first degree relative at less than age 48), is not hypertensive and has no history of hypercholesterolemia.   Past Medical History:  Diagnosis Date  . Anxiety   .  Diabetes mellitus without complication (HCC)   . Ovarian cyst   . Sleep apnea     Patient Active Problem List   Diagnosis Date Noted  . Low back pain 06/18/2019  . Chronic low back pain 04/22/2019  . Anxiety 04/22/2019  . HTN (hypertension) 04/22/2019  . OSA (obstructive sleep apnea) 12/02/2012    Past Surgical History:  Procedure Laterality Date  . ABDOMINAL HYSTERECTOMY      partial, ovaries remain, no cervix  . bone spur Right   . BREAST SURGERY     benign breast cyst removal  . CESAREAN SECTION    . NASAL SINUS SURGERY    . ovarian cyst removal    . TUBAL LIGATION       OB History    Gravida  2   Para      Term      Preterm      AB      Living  2     SAB      TAB      Ectopic      Multiple      Live Births              Family History  Problem Relation Age of Onset  . Hypertension Mother   . Hyperlipidemia Mother   . Heart disease Mother   . Diabetes Mother   . Asthma Mother   . Thyroid disease Mother   . Lupus Mother   . Immunodeficiency Mother   . Heart disease Father   . Hypertension Father   .  Cancer Paternal Grandmother        cervical  . Diabetes Paternal Grandfather   . Stroke Paternal Grandfather   . Diabetes Paternal Aunt   . Prostate cancer Paternal Uncle   . Heart disease Paternal Uncle     Social History   Tobacco Use  . Smoking status: Former Smoker    Types: Cigarettes    Quit date: 02/03/2012    Years since quitting: 8.0  . Smokeless tobacco: Never Used  Substance Use Topics  . Alcohol use: Yes    Comment: rare  . Drug use: No    Home Medications Prior to Admission medications   Medication Sig Start Date End Date Taking? Authorizing Provider  ALPRAZolam (XANAX) 0.25 MG tablet Take 1 tablet (0.25 mg total) by mouth daily as needed for anxiety. 08/12/19   Lorre Munroe, NP  Cholecalciferol (HM VITAMIN D3) 50 MCG (2000 UT) CAPS Take by mouth.    [provider]  famotidine (PEPCID) 20 MG tablet Take  1 tablet (20 mg total) by mouth 2 (two) times daily. 02/17/20   Loree Fee, MD  hydrochlorothiazide (HYDRODIURIL) 25 MG tablet Take 1 tablet (25 mg total) by mouth daily. 07/16/19   Lorre Munroe, NP  meloxicam (MOBIC) 15 MG tablet Take 1 tablet (15 mg total) by mouth daily as needed for pain (back pain). Take with food 06/18/19   Tower, Audrie Gallus, MD  methocarbamol (ROBAXIN) 500 MG tablet Take 1 tablet (500 mg total) by mouth every 8 (eight) hours as needed for muscle spasms. Caution of sedation 06/18/19   Tower, Idamae Schuller A, MD  venlafaxine XR (EFFEXOR-XR) 37.5 MG 24 hr capsule TAKE 1 CAPSULE BY MOUTH ONCE DAILY WITH BREAKFAST 07/27/19   Lorre Munroe, NP  Vitamin D, Ergocalciferol, (DRISDOL) 1.25 MG (50000 UNIT) CAPS capsule Take 1 capsule (50,000 Units total) by mouth every 30 (thirty) days. 07/29/19   Lorre Munroe, NP    Allergies    Patient has no known allergies.  Review of Systems   Review of Systems  Constitutional: Positive for diaphoresis. Negative for chills and fever.  HENT: Negative for facial swelling and voice change.   Eyes: Negative for redness and visual disturbance.  Respiratory: Negative for cough and shortness of breath.   Cardiovascular: Positive for chest pain. Negative for palpitations.  Gastrointestinal: Positive for nausea. Negative for abdominal pain and vomiting.  Genitourinary: Negative for difficulty urinating and dysuria.  Musculoskeletal: Negative for gait problem and joint swelling.  Skin: Negative for rash and wound.  Neurological: Negative for dizziness and headaches.  Psychiatric/Behavioral: Negative for confusion and suicidal ideas.    Physical Exam Updated Vital Signs BP 129/70 (BP Location: Left Arm)   Pulse 72   Temp 98.5 F (36.9 C) (Oral)   Resp 16   LMP 11/02/1997   SpO2 97%   Physical Exam Constitutional:      General: She is not in acute distress.    Appearance: She is obese. She is not ill-appearing.  HENT:     Head:  Normocephalic and atraumatic.     Mouth/Throat:     Mouth: Mucous membranes are moist.     Pharynx: Oropharynx is clear.  Eyes:     General: No scleral icterus.    Pupils: Pupils are equal, round, and reactive to light.  Cardiovascular:     Rate and Rhythm: Normal rate and regular rhythm.     Pulses: Normal pulses.  Radial pulses are 2+ on the right side and 2+ on the left side.       Dorsalis pedis pulses are 2+ on the right side and 2+ on the left side.       Posterior tibial pulses are 2+ on the right side and 2+ on the left side.  Pulmonary:     Effort: Pulmonary effort is normal. No respiratory distress.  Abdominal:     General: There is no distension.     Tenderness: There is no abdominal tenderness.  Musculoskeletal:        General: No tenderness or deformity.     Cervical back: Normal range of motion and neck supple.  Neurological:     General: No focal deficit present.     Mental Status: She is alert and oriented to person, place, and time.  Psychiatric:        Mood and Affect: Mood normal.        Behavior: Behavior normal.     ED Results / Procedures / Treatments   Labs (all labs ordered are listed, but only abnormal results are displayed) Labs Reviewed  COMPREHENSIVE METABOLIC PANEL - Abnormal; Notable for the following components:      Result Value   Glucose, Bld 134 (*)    Total Protein 6.2 (*)    AST 79 (*)    ALT 67 (*)    All other components within normal limits  I-STAT CHEM 8, ED - Abnormal; Notable for the following components:   Glucose, Bld 131 (*)    Calcium, Ion 1.13 (*)    All other components within normal limits  CBC  LIPASE, BLOOD  MAGNESIUM  CBG MONITORING, ED  TROPONIN I (HIGH SENSITIVITY)  TROPONIN I (HIGH SENSITIVITY)    EKG EKG Interpretation  Date/Time:  Thursday February 17 2020 20:23:00 EDT Ventricular Rate:  72 PR Interval:    QRS Duration: 93 QT Interval:  415 QTC Calculation: 455 R Axis:   -18 Text  Interpretation: Sinus rhythm Low voltage, precordial leads RSR' in V1 or V2, right VCD or RVH LVH by voltage Artifact Abnormal ECG Confirmed by Gerhard Munch [phone]) on 02/17/2020 8:55:24 PM   Radiology DG Chest Portable 1 View  Result Date: 02/17/2020 CLINICAL DATA:  Epigastric and chest pain, diaphoresis, nausea EXAM: PORTABLE CHEST 1 VIEW COMPARISON:  08/01/2019 FINDINGS: The heart size and mediastinal contours are within normal limits. Both lungs are clear. The visualized skeletal structures are unremarkable. IMPRESSION: No active disease. Electronically Signed   By: Sharlet Salina M.D.   On: 02/17/2020 20:32    Procedures Procedures (including critical care time)  Medications Ordered in ED Medications  acetaminophen (TYLENOL) tablet 650 mg (650 mg Oral Given 02/17/20 2042)  alum & mag hydroxide-simeth (MAALOX/MYLANTA) 200-200-20 MG/5ML suspension 30 mL (30 mLs Oral Given 02/17/20 2249)    And  lidocaine (XYLOCAINE) 2 % viscous mouth solution 15 mL (15 mLs Oral Given 02/17/20 2249)    ED Course  I have reviewed the triage vital signs and the nursing notes.  Pertinent labs & imaging results that were available during my care of the patient were reviewed by me and considered in my medical decision making (see chart for details).    MDM Rules/Calculators/A&P HEAR Score: 3                        EKG findings by my read: Compared to prior: 08/02/2019.  Rate: 72 rhythm: sinus Axis: Left  shift similar to prior PR: Appropriate QRS: 93 QTc: 455.  Left shift with questionable RSR prime similar to prior, otherwise no evidence of ischemia or arrhythmia, nor any other pathologic findings concerning considering patient presentation. Findings discussed with attending who agrees.  Differential diagnosis considered: ACS, PE, dissection, pneumothorax, pneumonia, emergent complications of ulcer, gastritis, esophagitis, esophageal spasm  Patient presents the ED with sharp atypical chest pain starting  prior to arrival.  Hear score calculated at 3.  Given aspirin by EMS and with worsening of symptoms in the setting of nitroglycerin  Arrival, patient symptomatically improving, 12-lead as above reassuring.  Broad chest pain work-up including a troponin, CBC, chemistries, chest x-ray obtained.  Dissection considered however with no neurologic deficits, no history of hypertensio, normal new treatment on arrival with equal pulses and no widened mediastinum, felt very unlikely  PE also considered however by [facility] criteria patient can be excluded and patient notably with no shortness of breath or tachypnea or tachycardia.  No evidence of pneumothorax or pneumonia on chest x-ray  Initial troponin reassuringly negative, will follow up repeat, administer GI cocktail with higher concern for GI etiology.  Patient notably with benign abdominal exam, doubt emergent complications of ulcer.  Repeat troponin negative and patient currently asymptomatic, at this time I feel patient is stable for discharge to follow-up with PCP, will prescribe short course of Pepcid to see if this helps.  Labs reviewed and interpreted by myself with significant findings above. Imaging reviewed by myself and interpreted by radiologist.  Case and plan above discussed with my attending Dr. Jeraldine Loots  Final Clinical Impression(s) / ED Diagnoses Final diagnoses:  Precordial pain    Rx / DC Orders ED Discharge Orders         Ordered    famotidine (PEPCID) 20 MG tablet  2 times daily        02/17/20 2326         Labs, studies and imaging reviewed by myself and considered in medical decision making if ordered. Imaging interpreted by radiology. Pt was discussed with my attending, Dr. Jeraldine Loots  Electronically signed by:  Christiane Ha Redding9/16/202111:28 PM       Loree Fee, MD 02/17/20 2025    Gerhard Munch, MD 02/23/20 1615

## 2020-02-22 ENCOUNTER — Emergency Department
Admission: EM | Admit: 2020-02-22 | Discharge: 2020-02-22 | Disposition: A | Discharge status: Home / Self Care | Payer: 59 | Attending: Emergency Medicine | Admitting: Emergency Medicine

## 2020-02-22 ENCOUNTER — Other Ambulatory Visit: Payer: Self-pay

## 2020-02-22 ENCOUNTER — Encounter: Payer: Self-pay | Admitting: Emergency Medicine

## 2020-02-22 ENCOUNTER — Emergency Department: Payer: 59

## 2020-02-22 DIAGNOSIS — E119 Type 2 diabetes mellitus without complications: Secondary | ICD-10-CM | POA: Diagnosis not present

## 2020-02-22 DIAGNOSIS — I10 Essential (primary) hypertension: Secondary | ICD-10-CM | POA: Insufficient documentation

## 2020-02-22 DIAGNOSIS — R079 Chest pain, unspecified: Secondary | ICD-10-CM

## 2020-02-22 DIAGNOSIS — R0789 Other chest pain: Secondary | ICD-10-CM | POA: Diagnosis present

## 2020-02-22 DIAGNOSIS — Z87891 Personal history of nicotine dependence: Secondary | ICD-10-CM | POA: Insufficient documentation

## 2020-02-22 DIAGNOSIS — Z79899 Other long term (current) drug therapy: Secondary | ICD-10-CM | POA: Diagnosis not present

## 2020-02-22 LAB — HEPATIC FUNCTION PANEL
ALT: 128 U/L — ABNORMAL HIGH (ref 0–44)
AST: 116 U/L — ABNORMAL HIGH (ref 15–41)
Albumin: 3.9 g/dL (ref 3.5–5.0)
Alkaline Phosphatase: 117 U/L (ref 38–126)
Bilirubin, Direct: 0.4 mg/dL — ABNORMAL HIGH (ref 0.0–0.2)
Indirect Bilirubin: 0.7 mg/dL (ref 0.3–0.9)
Total Bilirubin: 1.1 mg/dL (ref 0.3–1.2)
Total Protein: 6.7 g/dL (ref 6.5–8.1)

## 2020-02-22 LAB — LIPASE, BLOOD: Lipase: 44 U/L (ref 11–51)

## 2020-02-22 LAB — CBC
HCT: 40.6 % (ref 36.0–46.0)
Hemoglobin: 13.3 g/dL (ref 12.0–15.0)
MCH: 29 pg (ref 26.0–34.0)
MCHC: 32.8 g/dL (ref 30.0–36.0)
MCV: 88.6 fL (ref 80.0–100.0)
Platelets: 204 10*3/uL (ref 150–400)
RBC: 4.58 MIL/uL (ref 3.87–5.11)
RDW: 14.2 % (ref 11.5–15.5)
WBC: 6.3 10*3/uL (ref 4.0–10.5)
nRBC: 0 % (ref 0.0–0.2)

## 2020-02-22 LAB — BASIC METABOLIC PANEL
Anion gap: 11 (ref 5–15)
BUN: 14 mg/dL (ref 6–20)
CO2: 29 mmol/L (ref 22–32)
Calcium: 9.2 mg/dL (ref 8.9–10.3)
Chloride: 99 mmol/L (ref 98–111)
Creatinine, Ser: 0.83 mg/dL (ref 0.44–1.00)
GFR calc Af Amer: >60 mL/min (ref >60)
GFR calc non Af Amer: >60 mL/min (ref >60)
Glucose, Bld: 115 mg/dL — ABNORMAL HIGH (ref 70–99)
Potassium: 3.3 mmol/L — ABNORMAL LOW (ref 3.5–5.1)
Sodium: 139 mmol/L (ref 135–145)

## 2020-02-22 LAB — TROPONIN I (HIGH SENSITIVITY): Troponin I (High Sensitivity): 5 ng/L (ref <18)

## 2020-02-22 NOTE — Discharge Instructions (Signed)
Your ultrasound of your liver does show signs of possible mild cirrhosis.  Please follow-up with your doctor regarding this for recheck/reevaluation and ongoing monitoring.  Please avoid Tylenol products and alcohol.  Return to the emergency department for any symptoms personally concerning to yourself otherwise please follow-up with cardiology by calling the number provided to arrange a follow-up appointment.

## 2020-02-22 NOTE — ED Provider Notes (Signed)
[facility] Emergency Department Provider Note  Time seen: 8:05 PM  I have reviewed the triage vital signs and the nursing notes.   HISTORY  Chief Complaint Chest Pain   HPI Jody Jensen is a 49 y.o. female with a past medical history anxiety, diabetes, hypertension, presents to the emergency department for chest pain.  According to the patient approximately for 5 days ago she developed some central chest pain lasted about 10 or 15 minutes and then resolved.  States she has had some mild intermittent pain since then and then today she developed central chest pain once again described as more of a pressure sensation mild to moderate in severity near epigastric area.  Patient states it lasted approximately 10 to 15 minutes and then went away.  Denies any nausea or vomiting denies any shortness of breath or diaphoresis.  Patient did receive aspirin and nitroglycerin by EMS, upon arrival patient states very minimal pressure if any at this time.  States the pain is pretty much gone.  Denies any pleuritic pain.  No leg pain or swelling.  Patient is status post cholecystectomy.  Does not drink alcohol.   Past Medical History:  Diagnosis Date  . Anxiety   . Diabetes mellitus without complication (HCC)   . Ovarian cyst   . Sleep apnea     Patient Active Problem List   Diagnosis Date Noted  . Low back pain 06/18/2019  . Chronic low back pain 04/22/2019  . Anxiety 04/22/2019  . HTN (hypertension) 04/22/2019  . OSA (obstructive sleep apnea) 12/02/2012    Past Surgical History:  Procedure Laterality Date  . ABDOMINAL HYSTERECTOMY      partial, ovaries remain, no cervix  . bone spur Right   . BREAST SURGERY     benign breast cyst removal  . CESAREAN SECTION    . NASAL SINUS SURGERY    . ovarian cyst removal    . TUBAL LIGATION      Prior to Admission medications   Medication Sig Start Date End Date Taking? Authorizing Provider  ALPRAZolam (XANAX) 0.25 MG  tablet Take 1 tablet (0.25 mg total) by mouth daily as needed for anxiety. 08/12/19   Lorre Munroe, NP  Cholecalciferol (HM VITAMIN D3) 50 MCG (2000 UT) CAPS Take by mouth.    [provider]  famotidine (PEPCID) 20 MG tablet Take 1 tablet (20 mg total) by mouth 2 (two) times daily. 02/17/20   Loree Fee, MD  hydrochlorothiazide (HYDRODIURIL) 25 MG tablet Take 1 tablet (25 mg total) by mouth daily. 07/16/19   Lorre Munroe, NP  venlafaxine XR (EFFEXOR-XR) 37.5 MG 24 hr capsule TAKE 1 CAPSULE BY MOUTH ONCE DAILY WITH BREAKFAST 07/27/19   Lorre Munroe, NP  Vitamin D, Ergocalciferol, (DRISDOL) 1.25 MG (50000 UNIT) CAPS capsule Take 1 capsule (50,000 Units total) by mouth every 30 (thirty) days. 07/29/19   Lorre Munroe, NP    No Known Allergies  Family History  Problem Relation Age of Onset  . Hypertension Mother   . Hyperlipidemia Mother   . Heart disease Mother   . Diabetes Mother   . Asthma Mother   . Thyroid disease Mother   . Lupus Mother   . Immunodeficiency Mother   . Heart disease Father   . Hypertension Father   . Cancer Paternal Grandmother        cervical  . Diabetes Paternal Grandfather   . Stroke Paternal Grandfather   . Diabetes Paternal Aunt   .  Prostate cancer Paternal Uncle   . Heart disease Paternal Uncle     Social History Social History   Tobacco Use  . Smoking status: Former Smoker    Types: Cigarettes    Quit date: 02/03/2012    Years since quitting: 8.0  . Smokeless tobacco: Never Used  Substance Use Topics  . Alcohol use: Yes    Comment: rare  . Drug use: No    Review of Systems Constitutional: Negative for fever. Cardiovascular: Intermittent chest pain, now resolved Respiratory: Negative for shortness of breath. Gastrointestinal: Negative for abdominal pain, vomiting Musculoskeletal: Negative for musculoskeletal complaints Neurological: Negative for headache All other ROS  negative  ____________________________________________   PHYSICAL EXAM:  VITAL SIGNS: ED Triage Vitals  Enc Vitals Group     BP 02/22/20 1615 119/72     Pulse Rate 02/22/20 1615 76     Resp 02/22/20 1615 20     Temp 02/22/20 1615 98.2 F (36.8 C)     Temp Source 02/22/20 1615 Oral     SpO2 02/22/20 1615 96 %     Weight 02/22/20 1617 280 lb (127 kg)     Height 02/22/20 1617 5\' 3"  (1.6 m)     Head Circumference --      Peak Flow --      Pain Score 02/22/20 1616 2     Pain Loc --      Pain Edu? --      Excl. in GC? --     Constitutional: Alert and oriented. Well appearing and in no distress. Eyes: Normal exam ENT      Head: Normocephalic and atraumatic.      Mouth/Throat: Mucous membranes are moist. Cardiovascular: Normal rate, regular rhythm. No murmur Respiratory: Normal respiratory effort without tachypnea nor retractions. Breath sounds are clear  Gastrointestinal: Soft and nontender. No distention.   Musculoskeletal: Nontender with normal range of motion in all extremities.  Neurologic:  Normal speech and language. No gross focal neurologic deficits Skin:  Skin is warm, dry and intact.  Psychiatric: Mood and affect are normal.  ____________________________________________    EKG  EKG viewed and interpreted by myself shows normal sinus rhythm at 73 bpm with a narrow QRS, normal axis, normal intervals, no concerning ST changes.  ____________________________________________    RADIOLOGY   IMPRESSION:  No acute cardiopulmonary disease.   I have personally reviewed the patient's chest x-ray images, no acute findings on my examination.  Ultrasound shows status post cholecystectomy.  Nodular appearance of liver indicating possible cirrhosis.  ____________________________________________   INITIAL IMPRESSION / ASSESSMENT AND PLAN / ED COURSE  Pertinent labs & imaging results that were available during my care of the patient were reviewed by me and considered  in my medical decision making (see chart for details).   Patient presents emergency department for intermittent chest pain over the past for 5 days.  Occurred once 4 days ago and then once again today.  Overall the patient appears well, no distress.  No nausea or diaphoresis or shortness of breath.  Patient is status post cholecystectomy and states she has suffered a retained stone previously as well.  Patient states that times her LFTs were elevated mildly which is normal.  Patient's labs are resulted largely within normal limits including negative troponin.  LFTs are moderately elevated however and more so than 5 days ago.  Given these findings we will proceed with right upper quadrant ultrasound to further evaluate.  If negative anticipate likely discharge home with  cardiology follow-up for stress test.  Discussed this plan of care with the patient and she is agreeable.  Ultrasound shows no acute issues but does show underlying possible cirrhosis.  We will have the patient follow-up with her doctor regarding this.  From our standpoint the patient appears well, lab work overall reassuring.  Patient will follow up with her doctor as well as cardiology for a stress test.  Patient agreeable plan of care.  Jessia Kief was evaluated in Emergency Department on 02/22/2020 for the symptoms described in the history of present illness. She was evaluated in the context of the global COVID-19 pandemic, which necessitated consideration that the patient might be at risk for infection with the SARS-CoV-2 virus that causes COVID-19. Institutional protocols and algorithms that pertain to the evaluation of patients at risk for COVID-19 are in a state of rapid change based on information released by regulatory bodies including the CDC and federal and state organizations. These policies and algorithms were followed during the patient's care in the ED.  ____________________________________________   FINAL CLINICAL  IMPRESSION(S) / ED DIAGNOSES  Chest pain   Minna Antis, MD 02/22/20 2019

## 2020-02-22 NOTE — ED Triage Notes (Signed)
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 Pt states the pain started all of a sudden.  Pt states she had the same pain last Thursday and was seen at [facility].  Pt given 324 ASA with EMS and NTG x 1 which brought her pain down from 4 to 3.

## 2020-02-25 ENCOUNTER — Ambulatory Visit: Payer: 59 | Admitting: Internal Medicine

## 2020-02-25 ENCOUNTER — Other Ambulatory Visit: Payer: Self-pay

## 2020-02-25 ENCOUNTER — Encounter: Payer: Self-pay | Admitting: Internal Medicine

## 2020-02-25 VITALS — BP 132/76 | HR 85 | Temp 97.9°F | Wt 284.0 lb

## 2020-02-25 DIAGNOSIS — R072 Precordial pain: Secondary | ICD-10-CM | POA: Diagnosis not present

## 2020-02-25 DIAGNOSIS — Z23 Encounter for immunization: Secondary | ICD-10-CM | POA: Diagnosis not present

## 2020-02-25 DIAGNOSIS — I10 Essential (primary) hypertension: Secondary | ICD-10-CM | POA: Diagnosis not present

## 2020-02-25 DIAGNOSIS — K7689 Other specified diseases of liver: Secondary | ICD-10-CM

## 2020-02-25 DIAGNOSIS — R748 Abnormal levels of other serum enzymes: Secondary | ICD-10-CM

## 2020-02-25 DIAGNOSIS — K760 Fatty (change of) liver, not elsewhere classified: Secondary | ICD-10-CM

## 2020-02-25 DIAGNOSIS — R1013 Epigastric pain: Secondary | ICD-10-CM

## 2020-02-25 NOTE — Progress Notes (Signed)
Subjective:    Patient ID: Jody Jensen, female    DOB: 1971-02-03, 49 y.o.   MRN: [id number]  HPI  Pt presents to the clinic today for multiple ER follow up. She went to the ER 9/16 with c/o substernal chest pain radiating into her back with nausea and diaphoresis. Chest xray was negative. ECG showed NSR with LVH. Labs were remarkable for mildly elevated liver enzymes. She was treated with ASA, Nitroglycerin, Morphine and a GI cocktail with eventual improvement in symptoms. She was discharged with a RX for Famotidine and advised to follow up with her PCP.  She presented back to the ER 9/21 with c/o pressure in her upper abdomen. She was again given ASA and Nitroglycerin. Chest xray was negative. ECG was unchanged. Liver enzymes remained elevated but not worse. RUQ ultrasound was concerning for cirrhosis. She denies IV drug use, sexual promiscuity, excessive ETOH or Tylenol use.  She was advised to follow up with PCP and cardiology as an outpatient.   Since discharge, she has not had any epigastric pain or chest pain. She has been under a lot of stress lately, not sure if any of this was related to anxiety. She is taking Venlafaxine as prescribed and Xanax as needed.    Review of Systems      Past Medical History:  Diagnosis Date  . Anxiety   . Diabetes mellitus without complication (HCC)   . Ovarian cyst   . Sleep apnea     Current Outpatient Medications  Medication Sig Dispense Refill  . ALPRAZolam (XANAX) 0.25 MG tablet Take 1 tablet (0.25 mg total) by mouth daily as needed for anxiety. 15 tablet 0  . Cholecalciferol (HM VITAMIN D3) 50 MCG (2000 UT) CAPS Take by mouth.    . famotidine (PEPCID) 20 MG tablet Take 1 tablet (20 mg total) by mouth 2 (two) times daily. 60 tablet 0  . hydrochlorothiazide (HYDRODIURIL) 25 MG tablet Take 1 tablet (25 mg total) by mouth daily. 30 tablet 2  . venlafaxine XR (EFFEXOR-XR) 37.5 MG 24 hr capsule TAKE 1 CAPSULE BY MOUTH ONCE DAILY WITH BREAKFAST  90 capsule 2  . Vitamin D, Ergocalciferol, (DRISDOL) 1.25 MG (50000 UNIT) CAPS capsule Take 1 capsule (50,000 Units total) by mouth every 30 (thirty) days. 3 capsule 2   No current facility-administered medications for this visit.    No Known Allergies  Family History  Problem Relation Age of Onset  . Hypertension Mother   . Hyperlipidemia Mother   . Heart disease Mother   . Diabetes Mother   . Asthma Mother   . Thyroid disease Mother   . Lupus Mother   . Immunodeficiency Mother   . Heart disease Father   . Hypertension Father   . Cancer Paternal Grandmother        cervical  . Diabetes Paternal Grandfather   . Stroke Paternal Grandfather   . Diabetes Paternal Aunt   . Prostate cancer Paternal Uncle   . Heart disease Paternal Uncle     Social History   Socioeconomic History  . Marital status: Married    Spouse name: Not on file  . Number of children: Not on file  . Years of education: Not on file  . Highest education level: Not on file  Occupational History  . Not on file  Tobacco Use  . Smoking status: Former Smoker    Types: Cigarettes    Quit date: 02/03/2012    Years since quitting: 8.0  . Smokeless  tobacco: Never Used  Substance and Sexual Activity  . Alcohol use: Yes    Comment: rare  . Drug use: No  . Sexual activity: Yes    Birth control/protection: Surgical  Other Topics Concern  . Not on file  Social History Narrative  . Not on file   Social Determinants of Health   Financial Resource Strain:   . Difficulty of Paying Living Expenses: Not on file  Food Insecurity:   . Worried About Programme researcher, broadcasting/film/video in the Last Year: Not on file  . Ran Out of Food in the Last Year: Not on file  Transportation Needs:   . Lack of Transportation (Medical): Not on file  . Lack of Transportation (Non-Medical): Not on file  Physical Activity:   . Days of Exercise per Week: Not on file  . Minutes of Exercise per Session: Not on file  Stress:   . Feeling of Stress  : Not on file  Social Connections:   . Frequency of Communication with Friends and Family: Not on file  . Frequency of Social Gatherings with Friends and Family: Not on file  . Attends Religious Services: Not on file  . Active Member of Clubs or Organizations: Not on file  . Attends Banker Meetings: Not on file  . Marital Status: Not on file  Intimate Partner Violence:   . Fear of Current or Ex-Partner: Not on file  . Emotionally Abused: Not on file  . Physically Abused: Not on file  . Sexually Abused: Not on file     Constitutional: Denies fever, malaise, fatigue, headache or abrupt weight changes.  Respiratory: Denies difficulty breathing, shortness of breath, cough or sputum production.   Cardiovascular: Denies chest pain, chest tightness, palpitations or swelling in the hands or feet.  Gastrointestinal: Pt reports intermittent constipation. Denies abdominal pain, bloating, diarrhea or blood in the stool.  GU: Pt reports dark urine which has resolved. Denies urgency, frequency, pain with urination, burning sensation, blood in urine, odor or discharge. Skin: Denies redness, rashes, lesions or ulcercations.  Neurological: Denies dizziness, difficulty with memory, difficulty with speech or problems with balance and coordination.  Psych: Pt reports stress, anxiety. Denies depression, SI/HI.  No other specific complaints in a complete review of systems (except as listed in HPI above).  Objective:   Physical Exam   BP 132/76   Pulse 85   Temp 97.9 F (36.6 C) (Temporal)   Wt 284 lb (128.8 kg)   LMP 11/02/1997   SpO2 98%   BMI 50.31 kg/m   Wt Readings from Last 3 Encounters:  02/22/20 280 lb (127 kg)  08/01/19 297 lb 9.9 oz (135 kg)  07/16/19 287 lb (130.2 kg)    General: Appears her stated age, obese, in NAD. Skin: Warm, dry and intact. No rashes noted. HEENT: Head: normal shape and size;  Cardiovascular: Normal rate and rhythm. S1,S2 noted.  No murmur,  rubs or gallops noted. No JVD or BLE edema.  Pulmonary/Chest: Normal effort and positive vesicular breath sounds. No respiratory distress. No wheezes, rales or ronchi noted.  Abdomen: Soft and nontender. Normal bowel sounds.  Musculoskeletal:  No difficulty with gait.  Neurological: Alert and oriented.  Psychiatric: Somewhat tearful. Behavior is normal. Judgment and thought content normal.     BMET    Component Value Date/Time   NA 139 02/22/2020 1622   K 3.3 (L) 02/22/2020 1622   CL 99 02/22/2020 1622   CO2 29 02/22/2020 1622  GLUCOSE 115 (H) 02/22/2020 1622   BUN 14 02/22/2020 1622   CREATININE 0.83 02/22/2020 1622   CREATININE 0.75 07/16/2019 1551   CALCIUM 9.2 02/22/2020 1622   GFRNONAA >60 02/22/2020 1622   GFRAA >60 02/22/2020 1622    Lipid Panel     Component Value Date/Time   CHOL 187 07/16/2019 1551   TRIG 95 07/16/2019 1551   HDL 53 07/16/2019 1551   CHOLHDL 3.5 07/16/2019 1551   VLDL 20.6 09/01/2015 0815   LDLCALC 114 (H) 07/16/2019 1551    CBC    Component Value Date/Time   WBC 6.3 02/22/2020 1622   RBC 4.58 02/22/2020 1622   HGB 13.3 02/22/2020 1622   HCT 40.6 02/22/2020 1622   PLT 204 02/22/2020 1622   MCV 88.6 02/22/2020 1622   MCH 29.0 02/22/2020 1622   MCHC 32.8 02/22/2020 1622   RDW 14.2 02/22/2020 1622   LYMPHSABS 2.7 12/02/2012 1521   MONOABS 0.5 12/02/2012 1521   EOSABS 0.2 12/02/2012 1521   BASOSABS 0.0 12/02/2012 1521    Hgb A1C Lab Results  Component Value Date   HGBA1C 5.8 (H) 07/16/2019           Assessment & Plan:   ER Follow Up for Substernal Chest Pain, Epigastric Pain and Elevated Liver Enzymes:  ER notes, labs and imaging reviewed She has not been taking Famotidine, advised her to start this Will obtain acute hep panel today [facility] for further evaluation of possible cirrhosis Referral to cardiology for stress test  Will follow up after labs, return precautions discussed Nicki Reaper, NP This visit occurred during the SARS-CoV-2 public health emergency.  Safety protocols were in place, including screening questions prior to the visit, additional usage of staff PPE, and extensive cleaning of exam room while observing appropriate contact time as indicated for disinfecting solutions.

## 2020-02-25 NOTE — Patient Instructions (Signed)
Cirrhosis  Cirrhosis is long-term (chronic) liver injury. The liver is the body's largest internal organ, and it performs many functions. It converts food into energy, removes toxic material from the blood, makes important proteins, and absorbs necessary vitamins from food. In cirrhosis, healthy liver cells are replaced by scar tissue. This prevents blood from flowing through the liver, making it difficult for the liver to function. Scarring of the liver cannot be reversed, but treatment can prevent it from getting worse. What are the causes? Common causes of this condition are hepatitis C and long-term alcohol abuse. Other causes include:  Nonalcoholic fatty liver disease. This happens when fat is deposited in the liver by causes other than alcohol.  Hepatitis B infection.  Autoimmune hepatitis. In this condition, the body's defense system (immune system) mistakenly attacks the liver cells, causing irritation and swelling (inflammation).  Diseases that cause blockage of ducts inside the liver.  Inherited liver diseases, such as hemochromatosis. This is one of the most common inherited liver diseases. In this disease, deposits of iron collect in the liver and other organs.  Reactions to certain long-term medicines, such as amiodarone, a heart medicine.  Parasitic infections. These include schistosomiasis, which is caused by a flatworm.  Long-term contact to certain toxins. These toxins include certain organic solvents, such as toluene and chloroform. What increases the risk? You are more likely to develop this condition if:  You have certain types of viral hepatitis.  You abuse alcohol, especially if you are female.  You are overweight.  You share needles.  You have unprotected sex with someone who has viral hepatitis. What are the signs or symptoms? You may not have any signs and symptoms at first. Symptoms may not develop until the damage to your liver starts to get worse. Early  symptoms may include:  Weakness and tiredness (fatigue).  Changes in sleep patterns or having trouble sleeping.  Itchiness.  Tenderness in the right-upper part of your abdomen.  Weight loss and muscle loss.  Nausea.  Loss of appetite.  Appearance of tiny blood vessels under the skin. Later symptoms may include:  Fatigue or weakness that is getting worse.  Yellow skin and eyes (jaundice).  Buildup of fluid in the abdomen (ascites). You may notice that your clothes are tight around your waist.  Weight gain.  Swelling of the feet and ankles (edema).  Trouble breathing.  Easy bruising and bleeding.  Vomiting blood.  Black or bloody stool.  Mental confusion. How is this diagnosed? Your health care provider may suspect cirrhosis based on your symptoms and medical history, especially if you have other medical conditions or a history of alcohol abuse. Your health care provider will do a physical exam to feel your liver and to check for signs of cirrhosis. He or she may perform other tests, including:  Blood tests to check: ? For hepatitis B or C. ? Kidney function. ? Liver function.  Imaging tests such as: ? MRI or CT scan to look for changes seen in advanced cirrhosis. ? Ultrasound to see if normal liver tissue is being replaced by scar tissue.  A procedure in which a long needle is used to take a sample of liver tissue to be checked in a lab (biopsy). Liver biopsy can confirm the diagnosis of cirrhosis. How is this treated? Treatment for this condition depends on how damaged your liver is and what caused the damage. It may include treating the symptoms of cirrhosis, or treating the underlying causes in order to   slow the damage. Treatment may include:  Making lifestyle changes, such as: ? Eating a healthy diet. You may need to work with your health care provider or a diet and nutrition specialist (dietitian) to develop an eating plan. ? Restricting salt  intake. ? Maintaining a healthy weight. ? Not abusing drugs or alcohol.  Taking medicines to: ? Treat liver infections or other infections. ? Control itching. ? Reduce fluid buildup. ? Reduce certain blood toxins. ? Reduce risk of bleeding from enlarged blood vessels in the stomach or esophagus (varices).  Liver transplant. In this procedure, a liver from a donor is used to replace your diseased liver. This is done if cirrhosis has caused liver failure. Other treatments and procedures may be done depending on the problems that you get from cirrhosis. Common problems include liver-related kidney failure (hepatorenal syndrome). Follow these instructions at home:   Take medicines only as told by your health care provider. Do not use medicines that are toxic to your liver. Ask your health care provider before taking any new medicines, including over-the-counter medicines.  Rest as needed.  Eat a well-balanced diet. Ask your health care provider or dietitian for more information.  Limit your salt or water intake, if your health care provider asks you to do this.  Do not drink alcohol. This is especially important if you are taking acetaminophen.  Keep all follow-up visits as told by your health care provider. This is important. Contact a health care provider if you:  Have fatigue or weakness that is getting worse.  Develop swelling of the hands, feet, legs, or face.  Have a fever.  Develop loss of appetite.  Have nausea or vomiting.  Develop jaundice.  Develop easy bruising or bleeding. Get help right away if you:  Vomit bright red blood or a material that looks like coffee grounds.  Have blood in your stools.  Notice that your stools appear black and tarry.  Become confused.  Have chest pain or trouble breathing. Summary  Cirrhosis is chronic liver injury. Liver damage cannot be reversed. Common causes are hepatitis C and long-term alcohol abuse.  Tests used to  diagnose cirrhosis include blood tests, imaging tests, and liver biopsy.  Treatment for this condition involves treating the underlying cause. Avoid alcohol, drugs, salt, and medicines that may damage your liver.  Contact your health care provider if you develop ascites, edema, jaundice, fever, nausea or vomiting, easy bruising or bleeding, or worsening fatigue. This information is not intended to replace advice given to you by your health care provider. Make sure you discuss any questions you have with your health care provider. Document Revised: 09/09/2018 Document Reviewed: 04/09/2017 Elsevier Patient Education  2020 Elsevier Inc.  

## 2020-02-28 LAB — HEPATITIS PANEL, ACUTE
Hep A IgM: NONREACTIVE
Hep B C IgM: NONREACTIVE
Hepatitis B Surface Ag: NONREACTIVE
Hepatitis C Ab: NONREACTIVE
SIGNAL TO CUT-OFF: 0.01 (ref <1.00)

## 2020-03-01 ENCOUNTER — Encounter: Payer: Self-pay | Admitting: Nurse Practitioner

## 2020-03-01 ENCOUNTER — Encounter: Payer: Self-pay | Admitting: Internal Medicine

## 2020-03-01 DIAGNOSIS — K7689 Other specified diseases of liver: Secondary | ICD-10-CM

## 2020-03-31 ENCOUNTER — Other Ambulatory Visit (INDEPENDENT_AMBULATORY_CARE_PROVIDER_SITE_OTHER): Payer: 59

## 2020-03-31 ENCOUNTER — Encounter: Payer: Self-pay | Admitting: Nurse Practitioner

## 2020-03-31 ENCOUNTER — Ambulatory Visit: Payer: 59 | Admitting: Nurse Practitioner

## 2020-03-31 VITALS — BP 140/98 | HR 80 | Ht 64.0 in | Wt 289.0 lb

## 2020-03-31 DIAGNOSIS — E669 Obesity, unspecified: Secondary | ICD-10-CM

## 2020-03-31 DIAGNOSIS — R7989 Other specified abnormal findings of blood chemistry: Secondary | ICD-10-CM

## 2020-03-31 DIAGNOSIS — R079 Chest pain, unspecified: Secondary | ICD-10-CM

## 2020-03-31 DIAGNOSIS — K760 Fatty (change of) liver, not elsewhere classified: Secondary | ICD-10-CM

## 2020-03-31 LAB — BASIC METABOLIC PANEL
BUN: 9 mg/dL (ref 6–23)
CO2: 30 mEq/L (ref 19–32)
Calcium: 8.6 mg/dL (ref 8.4–10.5)
Chloride: 105 mEq/L (ref 96–112)
Creatinine, Ser: 0.72 mg/dL (ref 0.40–1.20)
GFR: 98.71 mL/min (ref >60.00)
Glucose, Bld: 89 mg/dL (ref 70–99)
Potassium: 3.7 mEq/L (ref 3.5–5.1)
Sodium: 141 mEq/L (ref 135–145)

## 2020-03-31 LAB — HEPATIC FUNCTION PANEL
ALT: 36 U/L — ABNORMAL HIGH (ref 0–35)
AST: 24 U/L (ref 0–37)
Albumin: 4.1 g/dL (ref 3.5–5.2)
Alkaline Phosphatase: 88 U/L (ref 39–117)
Bilirubin, Direct: 0.1 mg/dL (ref 0.0–0.3)
Total Bilirubin: 0.5 mg/dL (ref 0.2–1.2)
Total Protein: 6.5 g/dL (ref 6.0–8.3)

## 2020-03-31 LAB — IRON: Iron: 64 ug/dL (ref 42–145)

## 2020-03-31 LAB — LIPASE: Lipase: 17 U/L (ref 11.0–59.0)

## 2020-03-31 NOTE — Progress Notes (Signed)
03/31/2020 Cyrstal Leitz [id number] August 05, 1970   CHIEF COMPLAINT:  Elevated LFTs   HISTORY OF PRESENT ILLNESS:  Shell Blanchette is a 49 year old female with past medical history of anxiety, morbid obesity, asthma, sleep apnea uses a mouth device and diabetes mellitus type 2. S/P cholecystectomy secondary to gallstones 1989, s/p ERCP with sphincterotomy and stone extraction in 1993 and 1997 (per the patient's report). Past partial hysterectomy, tubal ligation,  C section, benign breast cyst excision and nasal surgery.   She presents to our office today as referred by Webb Silversmith NP for further evaluation regarding elevated LFTs and an abnormal RUQ sonogram.  She developed lower chest pain below the breast line while cooking dinner on 02/17/2020. She described having severe chest pain which radiated through to her back. No N/V. She called 911 and EMS was reported as normal and her pain abated. Approximately 45 minutes later, she had recurrence of her lower chest pain which radiated through to her back with profuse sweats.  She called EMS and she received aspirin and nitroglycerin. She presented to [facility] ED for evaluation.  In the ED, her troponin levels were normal.  A twelve-lead EKG without evidence of acute ischemia.  Labs showed a normal CBC with a WBC 8.1.  Hemoglobin 13.1.  Her LFTs were elevated with AST 79.  ALT 67.  Normal alk phos 87 and total bili 0.6.  Lipase 32.  She received a GI cocktail and she remained pain free.  She was discharged home on Famotidine 20 mg twice daily and she was instructed to follow-up with her primary care physician.  She developed recurrence of the same chest pain which radiates through to her back while she was at work on 02/22/2020.  She presented to [facility] ED by EMS services for further evaluation.  A repeat EKG was stable without evidence of acute ischemia.  A chest x-ray was negative. Her LFTs were elevated.  AST 116.  ALT 128.  Normal alk phos 117.  Total bili  1.1.  WBC 6.3.  Hemoglobin 13.3.  Platelet 204.  She underwent a RUQ sonogram which showed hepatic steatosis with a nodular appearance to the liver concerning for cirrhosis.  Her chest pain faded and she remained hemodynamically stable.  She was discharged home with the instructions to follow up with  Her PCP and to schedule a cardiac evaluation.  She was seen by Golden Hurter on 02/25/2020.  She was referred to cardiologist Dr. Martinique for consideration for a stress test, her appointment is scheduled on 04/20/2020.    She reported having one episode of mild episode of chest pain since her last ER visit which occurred  approximately 3 weeks ago and lasted 10 to 15 minutes then resolved without recurrence.  No associated nausea or vomiting.  She denies having any dysphagia or heartburn.  She remains on Famotidine 20 mg twice daily.  She is passing a normal formed brown bowel movement daily.  No rectal bleeding or melena.  Her urine was a darker orange color mid-September and since that time is normal yellow in color.  She denies ever having an EGD.  She underwent cholecystectomy in 1989 by Dr. Johnathan Hausen due to having gallstones.  She described having obstructive jaundice for which she was admitted to the hospital in 1993 and underwent an ERCP which sounds like she had a sphincterotomy and a stone extraction from the common bile duct.  She underwent a second ERCP in 1997 due to having severe chest  pain and elevated LFTs without jaundice.  No family history of biliary or pancreatic cancer.  No family history of colon cancer.  Her mother has a history of lupus.  No family history of liver disease.  She denies ever having a screening colonoscopy.  No alcohol or drug use.  No other complaints today.  CBC Latest Ref Rng & Units 02/22/2020 02/17/2020 02/17/2020  WBC 4.0 - 10.5 K/uL 6.3 - 8.1  Hemoglobin 12.0 - 15.0 g/dL 13.3 13.3 13.1  Hematocrit 36 - 46 % 40.6 39.0 41.0  Platelets 150 - 400 K/uL 204 - 187   CMP  Latest Ref Rng & Units 02/22/2020 02/17/2020 02/17/2020  Glucose 70 - 99 mg/dL 115(H) 131(H) 134(H)  BUN 6 - 20 mg/dL 14 20 15   Creatinine 0.44 - 1.00 mg/dL 0.83 0.90 0.91  Sodium 135 - 145 mmol/L 139 139 140  Potassium 3.5 - 5.1 mmol/L 3.3(L) 3.5 3.8  Chloride 98 - 111 mmol/L 99 102 103  CO2 22 - 32 mmol/L 29 - 26  Calcium 8.9 - 10.3 mg/dL 9.2 - 9.1  Total Protein 6.5 - 8.1 g/dL 6.7 - 6.2(L)  Total Bilirubin 0.3 - 1.2 mg/dL 1.1 - 0.6  Alkaline Phos 38 - 126 U/L 117 - 87  AST 15 - 41 U/L 116(H) - 79(H)  ALT 0 - 44 U/L 128(H) - 67(H)    RUQ abdomina sonogram: 1. Status post cholecystectomy. 2. Hepatic steatosis. The liver surface is somewhat nodular raising concern for underlying cirrhosis.   Past Medical History:  Diagnosis Date  . Anxiety   . Diabetes mellitus without complication ([place])   . Ovarian cyst   . Sleep apnea    Past Surgical History:  Procedure Laterality Date  . ABDOMINAL HYSTERECTOMY      partial, ovaries remain, no cervix  . bone spur Right   . BREAST SURGERY     benign breast cyst removal  . CESAREAN SECTION    . NASAL SINUS SURGERY    . ovarian cyst removal    . TUBAL LIGATION     Social History: She is married. 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 She has 2 sons. She smoked cigarettes 1ppd x 10 years, quit 11 or 12 years ago. No alcohol or drug use.   Family History: Mother age 44  Lupus. Father 58 health history unknown. Paternal grandmother ovarian cancer.  family history includes Asthma in her mother; Cancer in her paternal grandmother; Diabetes in her mother, paternal aunt, and paternal grandfather; Heart disease in her father, mother, and paternal uncle; Hyperlipidemia in her mother; Hypertension in her father and mother; Immunodeficiency in her mother; Lupus in her mother; Prostate cancer in her paternal uncle; Stroke in her paternal grandfather; Thyroid disease in her mother. No Known Allergies    Outpatient Encounter Medications as of  03/31/2020  Medication Sig  . ALPRAZolam (XANAX) 0.25 MG tablet Take 1 tablet (0.25 mg total) by mouth daily as needed for anxiety.  . Cholecalciferol (HM VITAMIN D3) 50 MCG (2000 UT) CAPS Take by mouth.  . famotidine (PEPCID) 20 MG tablet Take 1 tablet (20 mg total) by mouth 2 (two) times daily.  . hydrochlorothiazide (HYDRODIURIL) 25 MG tablet Take 1 tablet (25 mg total) by mouth daily.  Marland Kitchen venlafaxine XR (EFFEXOR-XR) 37.5 MG 24 hr capsule TAKE 1 CAPSULE BY MOUTH ONCE DAILY WITH BREAKFAST  . Vitamin D, Ergocalciferol, (DRISDOL) 1.25 MG (50000 UNIT) CAPS capsule Take 1 capsule (50,000 Units total) by mouth every  30 (thirty) days.   No facility-administered encounter medications on file as of 03/31/2020.    REVIEW OF SYSTEMS:  Gen: + sweats with chest pain episodes. No fevers.  No weight loss.  CV: See HPI.  Resp: Denies cough, shortness of breath of hemoptysis.  GI: See HPI.  GU : + Urine was darker orange for a few weeks now normal yellow. MS: Denies joint pain, muscles aches or weakness. Derm: Denies rash, itchiness, skin lesions or unhealing ulcers. Psych: + Anxiety.  No depression. Heme: Denies bruising, bleeding. Neuro:  Denies headaches, dizziness or paresthesias. Endo:  Denies any problems with DM, thyroid or adrenal function.    PHYSICAL EXAM: LMP 11/02/1997   BP (!) 140/98   Pulse 80   Ht 5' 4"  (1.626 m)   Wt 289 lb (131.1 kg)   LMP 11/02/1997   BMI 49.61 kg/m   General: Obese 49 year old female in no acute distress. Head: Normocephalic and atraumatic. Eyes:  Sclerae non-icteric, conjunctive pink. Ears: Normal auditory acuity. Mouth: Dentition intact. No ulcers or lesions.  Neck: Supple, no lymphadenopathy or thyromegaly.  Lungs: Clear bilaterally to auscultation without wheezes, crackles or rhonchi. Heart: Regular rate and rhythm. No murmur, rub or gallop appreciated.  Abdomen: Soft, nontender, non distended. No masses. No hepatosplenomegaly. Normoactive bowel  sounds x 4 quadrants.  Rectal: Deferred.  Musculoskeletal: Symmetrical with no gross deformities. Skin: Warm and dry. No rash or lesions on visible extremities. No jaundice.  Extremities: No edema. Neurological: Alert oriented x 4, no focal deficits.  Psychological:  Alert and cooperative. Normal mood and affect.  ASSESSMENT AND PLAN:  53.  49 year old female with chest pain: biliary versus esophageal versus cardiac etiology.  Elevated LFTs with normal alk phos and total bilirubin levels.  Past history of cholecystectomy in 1989 followed by ERCP for obstructive jaundice in 1993 and ERCP in 1997 for elevated LFTs and chest pain (Records not found in Epic). -Abdominal MRI/MRCP with and without contrast to further evaluate the liver and biliary tree -Proceed with cardiology consult 04/20/2020  -Avoid fatty food -Continue Famotidine 20 mg twice daily -Discussed scheduling an EGD after cardiology evaluation completed, to discuss further at the time of her follow-up appointment.  2. Elevated LFTs.  Right upper quadrant abdominal sonogram consistent with hepatic steatosis with nodularity potentially concerning for developing cirrhosis. Unlikely cirrhosis with normal platelet and albumin levels. The CBD was not dilated.  -Refer to [facility] to facilitate weight loss -I discussed the risk of fatty liver disease with progression to cirrhosis/liver failure -Hepatic panel, ANA, SMA, AMA, IgG, ceruloplasmin, iron, ferritin, Hep A total ab, Hep B surface antigen, Hep B surface antibody, Hep B core total ab, A1AT.   3. Colon cancer screening  -To further discuss scheduling a screening colonoscopy at the time of her follow-up appointment  4. DM II   5. Sleep apnea     CC:  Jearld Fenton, NP

## 2020-03-31 NOTE — Patient Instructions (Addendum)
If you are age 48 or older, your body mass index should be between 23-30. Your Body mass index is 49.61 kg/m. If this is out of the aforementioned range listed, please consider follow up with your Primary Care Provider.  If you are age 34 or younger, your body mass index should be between 19-25. Your Body mass index is 49.61 kg/m. If this is out of the aformentioned range listed, please consider follow up with your Primary Care Provider.   Your provider has requested that you go to the basement level for lab work before leaving today. Press "B" on the elevator. The lab is located at the first door on the left as you exit the elevator.  You have been scheduled for an MRI at [facility] on 04/07/20. Your appointment time is 8:00 am. Please arrive 30 minutes prior to your appointment time for registration purposes. Please make certain not to have anything to eat or drink 4 hours prior to your test. In addition, if you have any metal in your body, have a pacemaker or defibrillator, please be sure to let your ordering physician know. This test typically takes 45 minutes to 1 hour to complete. Should you need to reschedule, please call [phone] to do so.  Avoid Fatty foods as directed.  Call the office if your Chest Pain reoccurs.   We have referred you to [facility] in [place]. You will receive a call from them to schedule an appointment  You have been scheduled to follow up with Dr. Myrtie Neither on May 19, 2020 at 10:20 am

## 2020-04-04 LAB — CERULOPLASMIN: Ceruloplasmin: 34 mg/dL (ref 18–53)

## 2020-04-04 LAB — HEPATITIS B SURFACE ANTIBODY,QUALITATIVE: Hep B S Ab: NONREACTIVE

## 2020-04-04 LAB — ANTI-NUCLEAR AB-TITER (ANA TITER): ANA Titer 1: 1:40 {titer} — ABNORMAL HIGH

## 2020-04-04 LAB — HEPATITIS A ANTIBODY, IGM: Hep A IgM: NONREACTIVE

## 2020-04-04 LAB — ANA: Anti Nuclear Antibody (ANA): POSITIVE — AB

## 2020-04-04 LAB — IGG: IgG (Immunoglobin G), Serum: 689 mg/dL (ref 600–1640)

## 2020-04-04 LAB — ALPHA-1-ANTITRYPSIN: A-1 Antitrypsin, Ser: 156 mg/dL (ref 83–199)

## 2020-04-04 LAB — MITOCHONDRIAL ANTIBODIES: Mitochondrial M2 Ab, IgG: <=20 U

## 2020-04-04 NOTE — Progress Notes (Signed)
____________________________________________________________  Attending physician addendum:  Thank you for sending this case to me. I have reviewed the entire note, and the outlined plan seems appropriate.  This is suspicious for recurrent choledocholithiasis, so the MRCP is a good next step. In addition to waiting for cardiology evaluation, I would not proceed with EGD until after MRCP results.   Difficult to know what to make of [place] description of nodular surface, as [place] limited at this BMI.  Agree lab results do not suggest portal hypertension.  Amada Jupiter, MD  ____________________________________________________________

## 2020-04-05 ENCOUNTER — Other Ambulatory Visit: Payer: Self-pay

## 2020-04-05 DIAGNOSIS — K760 Fatty (change of) liver, not elsewhere classified: Secondary | ICD-10-CM

## 2020-04-05 DIAGNOSIS — R7989 Other specified abnormal findings of blood chemistry: Secondary | ICD-10-CM

## 2020-04-06 NOTE — Telephone Encounter (Signed)
I called the patient and I explained her IGG level was normal. Await SMA level. 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 I went through each lab result and explained in full detail. I discussed the + ANA can indicate autoimmune disease. Mother with history of Lupus. I recommended for her to follow up with her PCP to further discuss + ANA and to consider rheumatology consult. Await the remainder of labs ordered. LFTs significantly improved. Await MRI/MRCP results, scheduled tomorrow. She will need Hep B vaccination. She will need Hep A vaccination if  Hep A total antibody negative. She did not have any further questions. She will call our office if her cp recurs.

## 2020-04-07 ENCOUNTER — Ambulatory Visit (HOSPITAL_COMMUNITY)
Admission: RE | Admit: 2020-04-07 | Discharge: 2020-04-07 | Disposition: A | Discharge status: Home / Self Care | Payer: 59 | Source: Ambulatory Visit | Attending: Nurse Practitioner | Admitting: Nurse Practitioner

## 2020-04-07 ENCOUNTER — Other Ambulatory Visit: Payer: 59

## 2020-04-07 ENCOUNTER — Encounter (HOSPITAL_COMMUNITY): Payer: Self-pay

## 2020-04-07 ENCOUNTER — Other Ambulatory Visit: Payer: Self-pay

## 2020-04-07 DIAGNOSIS — K760 Fatty (change of) liver, not elsewhere classified: Secondary | ICD-10-CM

## 2020-04-07 DIAGNOSIS — R7989 Other specified abnormal findings of blood chemistry: Secondary | ICD-10-CM

## 2020-04-07 DIAGNOSIS — R079 Chest pain, unspecified: Secondary | ICD-10-CM

## 2020-04-07 DIAGNOSIS — E669 Obesity, unspecified: Secondary | ICD-10-CM

## 2020-04-07 NOTE — Progress Notes (Signed)
Failed attempt at MRI. Patient unable to tolerate the procedure due to claustrophobia. Patient states she will contact ordering MD and reschedule with medication.

## 2020-04-09 ENCOUNTER — Encounter: Payer: Self-pay | Admitting: Internal Medicine

## 2020-04-12 NOTE — Telephone Encounter (Signed)
Jody Jensen, see other my chart msg from patient. She was unable to proceed with the abdominal MRI/MRCP due to having claustrophobia. Pls send RX for Xanax 0.25mg  one tab to be taken 30 minutes before MRI. Pls facilitate rescheduling MRI/MRCP. thx

## 2020-04-13 LAB — HEPATITIS A ANTIBODY, TOTAL: Hepatitis A AB,Total: NONREACTIVE

## 2020-04-13 LAB — ANTI-SMOOTH MUSCLE ANTIBODY, IGG: Actin (Smooth Muscle) Antibody (IGG): <20 U (ref <20)

## 2020-04-14 ENCOUNTER — Other Ambulatory Visit: Payer: Self-pay

## 2020-04-14 DIAGNOSIS — R7989 Other specified abnormal findings of blood chemistry: Secondary | ICD-10-CM

## 2020-04-14 DIAGNOSIS — F419 Anxiety disorder, unspecified: Secondary | ICD-10-CM

## 2020-04-14 DIAGNOSIS — R079 Chest pain, unspecified: Secondary | ICD-10-CM

## 2020-04-14 DIAGNOSIS — K760 Fatty (change of) liver, not elsewhere classified: Secondary | ICD-10-CM

## 2020-04-14 MED ORDER — ALPRAZOLAM 0.25 MG PO TABS: ORAL_TABLET | ORAL | 0 refills | Status: DC

## 2020-04-17 ENCOUNTER — Other Ambulatory Visit: Payer: Self-pay | Admitting: Nurse Practitioner

## 2020-04-17 ENCOUNTER — Ambulatory Visit (HOSPITAL_COMMUNITY)
Admission: RE | Admit: 2020-04-17 | Discharge: 2020-04-17 | Disposition: A | Discharge status: Home / Self Care | Payer: 59 | Source: Ambulatory Visit | Attending: Nurse Practitioner | Admitting: Nurse Practitioner

## 2020-04-17 DIAGNOSIS — R079 Chest pain, unspecified: Secondary | ICD-10-CM

## 2020-04-17 DIAGNOSIS — R7989 Other specified abnormal findings of blood chemistry: Secondary | ICD-10-CM

## 2020-04-17 DIAGNOSIS — K760 Fatty (change of) liver, not elsewhere classified: Secondary | ICD-10-CM | POA: Diagnosis present

## 2020-04-17 MED ORDER — GADOBUTROL 1 MMOL/ML IV SOLN
10.0000 mL | Freq: Once | INTRAVENOUS | Status: AC | PRN
  Administered 2020-04-17: 10 mL via INTRAVENOUS

## 2020-04-17 NOTE — Progress Notes (Signed)
Cardiology Office Note   Date:  04/20/2020   ID:  Jody Jensen, DOB 04-07-1971, MRN [id number]  PCP:  Lorre Munroe, NP  Cardiologist:   Yamira Papa [place], MD   Chief Complaint  Patient presents with  . Chest Pain      History of Present Illness: Jody Jensen is a 49 y.o. female who is seen at the request of Nicki Reaper NP for evaluation of chest pain. She has a history of borderline HTN, pre DM, and obesity with OSA. LDL cholesterol 114.  She was seen in the ED on 9/21 for evaluation of chest pain. Ecg showed no acute changes and troponins were negative.   Patient reports she has had 2 episodes of chest pain in the last 2 months. The first lasted 20 minutes and was when she went to the ED. The second lasted 10  Minutes. She describes this as a severe lower sternal/epigastric pain radiating to her back. Resolved on its own. Felt she couldn't take a deep breath. She does have a fatty liver.     Past Medical History:  Diagnosis Date  . Anxiety   . Diabetes mellitus without complication (HCC)   . Hyperlipidemia   . Hypertension   . Obesity   . OSA (obstructive sleep apnea)   . Ovarian cyst   . Sleep apnea     Past Surgical History:  Procedure Laterality Date  . ABDOMINAL HYSTERECTOMY      partial, ovaries remain, no cervix  . bone spur Right   . BREAST SURGERY     benign breast cyst removal  . CESAREAN SECTION    . CHOLECYSTECTOMY    . NASAL SINUS SURGERY    . ovarian cyst removal    . TUBAL LIGATION       Current Outpatient Medications  Medication Sig Dispense Refill  . ADVAIR DISKUS 250-50 MCG/DOSE AEPB Inhale 1 puff into the lungs 2 (two) times daily.    Marland Kitchen albuterol (ACCUNEB) 0.63 MG/3ML nebulizer solution Take 1 ampule by nebulization every 6 (six) hours as needed for wheezing.    Marland Kitchen albuterol (VENTOLIN HFA) 108 (90 Base) MCG/ACT inhaler Inhale 2 puffs into the lungs every 4 (four) hours as needed.    . ALPRAZolam (XANAX) 0.25 MG tablet Take 30 minutes  before procedure 1 tablet 0  . Cholecalciferol (HM VITAMIN D3) 50 MCG (2000 UT) CAPS Take by mouth.    . famotidine (PEPCID) 20 MG tablet Take 1 tablet (20 mg total) by mouth 2 (two) times daily. 60 tablet 0  . Fluticasone-Salmeterol (ADVAIR DISKUS IN) Inhale 1 puff into the lungs daily as needed.    . hydrochlorothiazide (HYDRODIURIL) 25 MG tablet Take 1 tablet (25 mg total) by mouth daily. 30 tablet 2  . levocetirizine (XYZAL) 5 MG tablet Take 5 mg by mouth every morning.    . montelukast (SINGULAIR) 10 MG tablet Take 1 tablet by mouth as needed.    . venlafaxine XR (EFFEXOR-XR) 37.5 MG 24 hr capsule TAKE 1 CAPSULE BY MOUTH ONCE DAILY WITH BREAKFAST 90 capsule 2  . Vitamin D, Ergocalciferol, (DRISDOL) 1.25 MG (50000 UNIT) CAPS capsule Take 1 capsule (50,000 Units total) by mouth every 30 (thirty) days. 3 capsule 2  . metoprolol tartrate (LOPRESSOR) 100 MG tablet Take 100 mg 2 hours before Coronary CT 1 tablet 0   No current facility-administered medications for this visit.    Allergies:   Patient has no known allergies.    Social History:  The patient  reports that she quit smoking about 8 years ago. Her smoking use included cigarettes. She has never used smokeless tobacco. She reports current alcohol use. She reports that she does not use drugs.   Family History:  The patient's family history includes Asthma in her mother; Cancer in her paternal grandmother; Diabetes in her mother, paternal aunt, and paternal grandfather; Heart disease in her father, mother, and paternal uncle; Hyperlipidemia in her mother; Hypertension in her father and mother; Immunodeficiency in her mother; Lupus in her mother; Prostate cancer in her paternal uncle; Stroke in her paternal grandfather; Thyroid disease in her mother.    ROS:  Please see the history of present illness.   Otherwise, review of systems are positive for none.   All other systems are reviewed and negative.    PHYSICAL EXAM: VS:  BP (!)  130/100   Pulse 79   Ht 5\' 4"  (1.626 m)   Wt 287 lb (130.2 kg)   LMP 11/02/1997   BMI 49.26 kg/m  , BMI Body mass index is 49.26 kg/m. GEN: Well nourished, obese, in no acute distress  HEENT: normal  Neck: no JVD, carotid bruits, or masses Cardiac: RRR; no murmurs, rubs, or gallops,no edema  Respiratory:  clear to auscultation bilaterally, normal work of breathing GI: soft, nontender, nondistended, + BS MS: no deformity or atrophy  Skin: warm and dry, no rash Neuro:  Strength and sensation are intact Psych: euthymic mood, full affect   EKG:  EKG is ordered today. The ekg ordered today demonstrates NSR rate 79. Mild voltage criteria for LVH. I have personally reviewed and interpreted this study.    Recent Labs: 07/16/2019: TSH 1.18 02/17/2020: Magnesium 2.1 02/22/2020: Hemoglobin 13.3; Platelets 204 03/31/2020: ALT 36; BUN 9; Creatinine, Ser 0.72; Potassium 3.7; Sodium 141    Lipid Panel    Component Value Date/Time   CHOL 187 07/16/2019 1551   TRIG 95 07/16/2019 1551   HDL 53 07/16/2019 1551   CHOLHDL 3.5 07/16/2019 1551   VLDL 20.6 09/01/2015 0815   LDLCALC 114 (H) 07/16/2019 1551      Wt Readings from Last 3 Encounters:  04/20/20 287 lb (130.2 kg)  03/31/20 289 lb (131.1 kg)  02/25/20 284 lb (128.8 kg)      Other studies Reviewed: Additional studies/ records that were reviewed today include: none   ASSESSMENT AND PLAN:  1.  Chest pain. Given comorbidities she is at intermediate cardiac risk. No objective evidence of ischemia. Recommend further ischemic evaluation. Discussed options of nuclear stress testing versus coronary CTA. I would favor CTA. Discussed risk of study including use of contrast. Will pretreat with metoprolol.  2. Pre DM. Last A1c 5.8% 3. Elevated BP without history of HTN. On HCTZ for "fluid" 4. Dyslipidemia. LDL 114. If she does have evidence of vascular disease on CT will need statin therapy 5. Morbid obesity with OSA. Uses oral  appliance.    Current medicines are reviewed at length with the patient today.  The patient does not have concerns regarding medicines.  The following changes have been made:  no change  Labs/ tests ordered today include: Coronary CTA  Orders Placed This Encounter  Procedures  . Basic metabolic panel  . EKG 12-Lead     Disposition:   TBD  Signed, Darrill Vreeland 02/27/20, MD  04/20/2020 9:34 AM    [facility] [address], [place], [place], [place] Phone [phone], Fax [phone]

## 2020-04-20 ENCOUNTER — Encounter: Payer: Self-pay | Admitting: Cardiology

## 2020-04-20 ENCOUNTER — Ambulatory Visit: Payer: 59 | Admitting: Cardiology

## 2020-04-20 ENCOUNTER — Other Ambulatory Visit: Payer: Self-pay

## 2020-04-20 VITALS — BP 130/100 | HR 79 | Ht 64.0 in | Wt 287.0 lb

## 2020-04-20 DIAGNOSIS — R7303 Prediabetes: Secondary | ICD-10-CM

## 2020-04-20 DIAGNOSIS — G4733 Obstructive sleep apnea (adult) (pediatric): Secondary | ICD-10-CM

## 2020-04-20 DIAGNOSIS — R072 Precordial pain: Secondary | ICD-10-CM

## 2020-04-20 DIAGNOSIS — E785 Hyperlipidemia, unspecified: Secondary | ICD-10-CM

## 2020-04-20 LAB — BASIC METABOLIC PANEL
BUN/Creatinine Ratio: 15 (ref 9–23)
BUN: 11 mg/dL (ref 6–24)
CO2: 23 mmol/L (ref 20–29)
Calcium: 9 mg/dL (ref 8.7–10.2)
Chloride: 103 mmol/L (ref 96–106)
Creatinine, Ser: 0.73 mg/dL (ref 0.57–1.00)
GFR calc Af Amer: 113 mL/min/{1.73_m2} (ref >59)
GFR calc non Af Amer: 98 mL/min/{1.73_m2} (ref >59)
Glucose: 86 mg/dL (ref 65–99)
Potassium: 4.2 mmol/L (ref 3.5–5.2)
Sodium: 141 mmol/L (ref 134–144)

## 2020-04-20 MED ORDER — METOPROLOL TARTRATE 100 MG PO TABS: ORAL_TABLET | ORAL | 0 refills | Status: DC

## 2020-04-20 NOTE — Patient Instructions (Addendum)
Medication Instructions:  Continue same medications   Lab Work: Bmet today   Testing/Procedures: Coronary CT   Will be scheduled after approved by insurance   Follow instructions below   Follow-Up: At [facility], you and your health needs are our priority.  As part of our continuing mission to provide you with exceptional heart care, we have created designated Provider Care Teams.  These Care Teams include your primary Cardiologist (physician) and Advanced Practice Providers (APPs -  Physician Assistants and Nurse Practitioners) who all work together to provide you with the care you need, when you need it.  We recommend signing up for the patient portal called "MyChart".  Sign up information is provided on this After Visit Summary.  MyChart is used to connect with patients for Virtual Visits (Telemedicine).  Patients are able to view lab/test results, encounter notes, upcoming appointments, etc.  Non-urgent messages can be sent to your provider as well.   To learn more about what you can do with MyChart, go to [email or web address].    Your next appointment:  To be determined after test   The format for your next appointment: Office   Provider:  Dr.Jordan     Your cardiac CT will be scheduled at one of the below locations:   [facility] [address] [place], [place] [address] [phone]  [facility] [address] [place], [place] [address] [phone]  If scheduled at [facility], please arrive at the [facility] main entrance of [facility] 30 minutes prior to test start time. Proceed to the [facility] Radiology Department (first floor) to check-in and test prep.  If scheduled at [facility], please arrive 15 mins early for check-in and test prep.  Please follow these instructions carefully (unless otherwise directed):    On the Night Before the  Test: . Be sure to Drink plenty of water. . Do not consume any caffeinated/decaffeinated beverages or chocolate 12 hours prior to your test. . Do not take any antihistamines 12 hours prior to your test.  On the Day of the Test: . Drink plenty of water. Do not drink any water within one hour of the test. . Do not eat any food 4 hours prior to the test. . You may take your regular medications prior to the test.  . Take metoprolol 100 mg two hours prior to test. . HOLD Hydrochlorothiazide morning of the test. . FEMALES- please wear underwire-free bra if available        After the Test: . Drink plenty of water. . After receiving IV contrast, you may experience a mild flushed feeling. This is normal. . On occasion, you may experience a mild rash up to 24 hours after the test. This is not dangerous. If this occurs, you can take Benadryl 25 mg and increase your fluid intake. . If you experience trouble breathing, this can be serious. If it is severe call 911 IMMEDIATELY. If it is mild, please call our office.    Once we have confirmed authorization from your insurance company, we will call you to set up a date and time for your test. Based on how quickly your insurance processes prior authorizations requests, please allow up to 4 weeks to be contacted for scheduling your Cardiac CT appointment. Be advised that routine Cardiac CT appointments could be scheduled as many as 8 weeks after your provider has ordered it.  For non-scheduling related questions,  please contact the cardiac imaging nurse navigator should you have any questions/concerns: Marchia Bond, Cardiac Imaging Nurse Navigator Burley Saver, Interim Cardiac Imaging Nurse [facility] Direct Office Dial: [phone]   For scheduling needs, including cancellations and rescheduling, please call [place], [phone] (temporary number).

## 2020-04-20 NOTE — Addendum Note (Signed)
Addended by: Neoma Laming on: 04/20/2020 09:41 AM   Modules accepted: Orders

## 2020-05-03 ENCOUNTER — Ambulatory Visit: Payer: 59 | Admitting: Gastroenterology

## 2020-05-05 ENCOUNTER — Ambulatory Visit: Payer: 59 | Admitting: Dietician

## 2020-05-10 ENCOUNTER — Telehealth (HOSPITAL_COMMUNITY): Payer: Self-pay | Admitting: *Deleted

## 2020-05-10 ENCOUNTER — Other Ambulatory Visit: Payer: Self-pay | Admitting: Internal Medicine

## 2020-05-10 NOTE — Telephone Encounter (Signed)

## 2020-05-10 NOTE — Telephone Encounter (Signed)
Attempted to call patient regarding upcoming cardiac CT appointment. Left message on voicemail with name and callback number  Manette Doto Tai RN Navigator Cardiac Imaging [facility] [phone] Office [phone] Cell  

## 2020-05-11 ENCOUNTER — Ambulatory Visit: Payer: 59 | Admitting: Gastroenterology

## 2020-05-12 ENCOUNTER — Ambulatory Visit (HOSPITAL_COMMUNITY)
Admission: RE | Admit: 2020-05-12 | Discharge: 2020-05-12 | Disposition: A | Discharge status: Home / Self Care | Payer: 59 | Source: Ambulatory Visit | Attending: Cardiology | Admitting: Cardiology

## 2020-05-12 ENCOUNTER — Encounter (HOSPITAL_COMMUNITY): Payer: Self-pay

## 2020-05-12 ENCOUNTER — Other Ambulatory Visit: Payer: Self-pay

## 2020-05-12 DIAGNOSIS — R072 Precordial pain: Secondary | ICD-10-CM

## 2020-05-12 MED ORDER — NITROGLYCERIN 0.4 MG SL SUBL
0.8000 mg | SUBLINGUAL_TABLET | Freq: Once | SUBLINGUAL | Status: AC
  Administered 2020-05-12: 0.8 mg via SUBLINGUAL

## 2020-05-12 MED ORDER — IOHEXOL 350 MG/ML SOLN
80.0000 mL | Freq: Once | INTRAVENOUS | Status: AC | PRN
  Administered 2020-05-12: 80 mL via INTRAVENOUS

## 2020-05-12 MED ORDER — NITROGLYCERIN 0.4 MG SL SUBL
SUBLINGUAL_TABLET | SUBLINGUAL | Status: AC
  Filled 2020-05-12: qty 2

## 2020-05-12 NOTE — Progress Notes (Signed)
Patient tolerated CT well. Drank water, soda, and ate cookies after.  Patient tolerated CT well. Vital signs stable encourage to drink water throughout day.Reasons explained and verbalized understanding. Ambulated steady gait.

## 2020-05-19 ENCOUNTER — Ambulatory Visit: Payer: 59 | Admitting: Gastroenterology

## 2020-05-19 ENCOUNTER — Encounter: Payer: Self-pay | Admitting: Gastroenterology

## 2020-05-19 VITALS — BP 130/90 | HR 84 | Ht 63.0 in | Wt 293.5 lb

## 2020-05-19 DIAGNOSIS — R1013 Epigastric pain: Secondary | ICD-10-CM | POA: Diagnosis not present

## 2020-05-19 DIAGNOSIS — Z1211 Encounter for screening for malignant neoplasm of colon: Secondary | ICD-10-CM

## 2020-05-19 DIAGNOSIS — Z23 Encounter for immunization: Secondary | ICD-10-CM | POA: Diagnosis not present

## 2020-05-19 DIAGNOSIS — R7989 Other specified abnormal findings of blood chemistry: Secondary | ICD-10-CM | POA: Diagnosis not present

## 2020-05-19 MED ORDER — PLENVU 140 G PO SOLR: 140.0000 g | ORAL | 0 refills | Status: DC

## 2020-05-19 NOTE — Patient Instructions (Addendum)
If you are age 48 or older, your body mass index should be between 23-30. Your Body mass index is 51.99 kg/m. If this is out of the aforementioned range listed, please consider follow up with your Primary Care Provider.  If you are age 23 or younger, your body mass index should be between 19-25. Your Body mass index is 51.99 kg/m. If this is out of the aformentioned range listed, please consider follow up with your Primary Care Provider.   You have been scheduled for a colonoscopy. Please follow written instructions given to you at your visit today.  Please pick up your prep supplies at the pharmacy within the next 1-3 days. If you use inhalers (even only as needed), please bring them with you on the day of your procedure.  Today we gave you Twinrix ( Hep A and Hep B ) vaccine. We have scheduled your second vaccine on 06/20/20 at 10:00 AM.   It was a pleasure to see you today!  Dr. Myrtie Neither

## 2020-05-19 NOTE — Progress Notes (Signed)
[facility] Progress Note  Chief Complaint: Elevated LFTs  Subjective  History: Initial office visit Mar 31, 2020 (detailed history reviewed there). Recurrent episodes of chest pain leading to ED visits with elevated LFTs raising concern for choledocholithiasis.  Patient was also referred to cardiology for stress testing.  Distant cholecystectomy and subsequent ERCP in 1993 and again 1997 for choledocholithiasis. Recent right upper quadrant ultrasound showed fatty liver and reported nodular liver surface.  Patient's exam and labs did not suggest portal hypertension at most recent visit. Referral made to healthy weight and wellness center to facilitate weight loss in the setting of morbid obesity and fatty liver. Metabolic and autoimmune liver disease lab work-up also obtained.  Jody Jensen is glad to report she has been feeling well, with no recurrence of the lower chest/epigastric pain since the second episode and evaluation at our office.  She says they were definitely reminiscent of previous gallbladder/bile duct stones.  She was also glad to hear that her cardiac testing returned well. Denies dysphagia, odynophagia, nausea vomiting or chronic upper digestive symptoms prior to these 2 episodes. Bowel habits are regular without rectal bleeding Unfortunately, she discovered that her insurance would not cover the cost of a visit to the healthy weight and wellness clinic.  ROS: Cardiovascular:  no chest pain Respiratory: no dyspnea Asthma symptoms Allergies Remainder of systems negative except as above  The patient's Past Medical, Family and Social History were reviewed and are on file in the EMR.  Objective:  Med list reviewed  Current Outpatient Medications:  .  ADVAIR DISKUS 250-50 MCG/DOSE AEPB, Inhale 1 puff into the lungs 2 (two) times daily., Disp: , Rfl:  .  albuterol (ACCUNEB) 0.63 MG/3ML nebulizer solution, Take 1 ampule by nebulization every 6 (six) hours as needed for  wheezing., Disp: , Rfl:  .  albuterol (VENTOLIN HFA) 108 (90 Base) MCG/ACT inhaler, Inhale 2 puffs into the lungs every 4 (four) hours as needed., Disp: , Rfl:  .  ALPRAZolam (XANAX) 0.25 MG tablet, Take 30 minutes before procedure, Disp: 1 tablet, Rfl: 0 .  Cholecalciferol (HM VITAMIN D3) 50 MCG (2000 UT) CAPS, Take by mouth., Disp: , Rfl:  .  famotidine (PEPCID) 20 MG tablet, Take 1 tablet (20 mg total) by mouth 2 (two) times daily., Disp: 60 tablet, Rfl: 0 .  Fluticasone-Salmeterol (ADVAIR DISKUS IN), Inhale 1 puff into the lungs daily as needed., Disp: , Rfl:  .  hydrochlorothiazide (HYDRODIURIL) 25 MG tablet, Take 1 tablet (25 mg total) by mouth daily., Disp: 30 tablet, Rfl: 2 .  levocetirizine (XYZAL) 5 MG tablet, Take 5 mg by mouth every morning., Disp: , Rfl:  .  metoprolol tartrate (LOPRESSOR) 100 MG tablet, Take 100 mg 2 hours before Coronary CT, Disp: 1 tablet, Rfl: 0 .  montelukast (SINGULAIR) 10 MG tablet, Take 1 tablet by mouth as needed., Disp: , Rfl:  .  predniSONE (DELTASONE) 20 MG tablet, Take 20 mg by mouth 2 (two) times daily., Disp: , Rfl:  .  venlafaxine XR (EFFEXOR-XR) 37.5 MG 24 hr capsule, TAKE 1 CAPSULE BY MOUTH ONCE DAILY WITH BREAKFAST, Disp: 90 capsule, Rfl: 2 .  Vitamin D, Ergocalciferol, (DRISDOL) 1.25 MG (50000 UNIT) CAPS capsule, Take 1 capsule (50,000 Units total) by mouth every 30 (thirty) days. SCHEDULE PHYSICAL, Disp: 3 capsule, Rfl: 0   Vital signs in last 24 hrs: Vitals:   05/19/20 1028  BP: 130/90  Pulse: 84    Physical Exam  Well-appearing  HEENT: sclera  anicteric, oral mucosa moist without lesions  Neck: supple, no thyromegaly, JVD or lymphadenopathy  Cardiac: RRR without murmurs, S1S2 heard, no peripheral edema  Pulm: clear to auscultation bilaterally, normal RR and effort noted  Abdomen: soft, no tenderness, with active bowel sounds.  (Cannot assess mass or hepatosplenomegaly due to body habitus).  Open cholecystectomy scar  Skin; warm  and dry, no jaundice or rash  Labs:  CMP Latest Ref Rng & Units 04/20/2020 03/31/2020 02/22/2020  Glucose 65 - 99 mg/dL 86 89 182(X)  BUN 6 - 24 mg/dL 11 9 14   Creatinine 0.57 - 1.00 mg/dL 9.37 1.69  Sodium 134 - 144 mmol/L 141 141 139  Potassium 3.5 - 5.2 mmol/L 4.2 3.7 3.3(L)  Chloride 96 - 106 mmol/L 103 105 99  CO2 20 - 29 mmol/L 23 30 29   Calcium 8.7 - 10.2 mg/dL 9.0 8.6 9.2  Total Protein 6.0 - 8.3 g/dL - 6.5 6.7  Total Bilirubin 0.2 - 1.2 mg/dL - 0.5 1.1  Alkaline Phos 39 - 117 U/L - 88 117  AST 0 - 37 U/L - 24 116(H)  ALT 0 - 35 U/L - 36(H) 128(H)   Normal A1 AT, total IgG, ceruloplasmin, iron Negative smooth muscle antibody ___________________________________________ Radiologic studies:  CT coronary on May 12, 2020 with a calcium score of 0. Cardiologist feels the chest pain was noncardiac in nature.  ___________________   MRI/MRCP:  CLINICAL DATA:  Abnormal ultrasound, hepatic steatosis, evaluate for cirrhosis   EXAM: MRI ABDOMEN WITHOUT AND WITH CONTRAST (INCLUDING MRCP)   TECHNIQUE: Multiplanar multisequence MR imaging of the abdomen was performed both before and after the administration of intravenous contrast. Heavily T2-weighted images of the biliary and pancreatic ducts were obtained, and three-dimensional MRCP images were rendered by post processing.   CONTRAST:  46mL GADAVIST GADOBUTROL 1 MMOL/ML IV SOLN   COMPARISON:  Right upper quadrant ultrasound, 02/22/2020   FINDINGS: Examination is somewhat limited by patient body habitus.   Lower chest: No acute findings.   Hepatobiliary: Hepatic steatosis. There is mild enlargement of the left lobe of the liver and a mildly heterogeneous pattern of parenchymal enhancement. No focal liver lesion. Status post cholecystectomy. Mild postoperative biliary ductal dilatation.   Pancreas: No mass, inflammatory changes, or other parenchymal abnormality identified.   Spleen:  Within normal limits in  size and appearance.   Adrenals/Urinary Tract: No masses identified. No evidence of hydronephrosis.   Stomach/Bowel: Visualized portions within the abdomen are unremarkable. Normal appendix.   Vascular/Lymphatic: No pathologically enlarged lymph nodes identified. No abdominal aortic aneurysm demonstrated.   Other:  None.   Musculoskeletal: No suspicious bone lesions identified.   IMPRESSION: 1. Hepatic steatosis. 2. There is mild enlargement of the left lobe of the liver and a mildly heterogeneous pattern of parenchymal enhancement, however there are no overt morphologic stigmata of cirrhosis. 3. No focal liver lesion or suspicious contrast enhancement. 4. Status post cholecystectomy. Mild postoperative biliary ductal dilatation.     Electronically Signed   By: 9m M.D.   On: 04/17/2020 21:02  ____________________________________________ Other:   _____________________________________________ Assessment & Plan  Assessment: Encounter Diagnoses  Name Primary?  . Epigastric pain Yes  . Elevated LFTs   . Special screening for malignant neoplasms, colon    I think these episodes were choledocholithiasis with stones having now passed. She has baseline modest LFT elevation related to metabolic fatty liver.  We discussed this condition and the need for low carbohydrate diet and weight loss.  This condition can progress to cirrhosis, but she does not have a convincing clinical evidence of cirrhosis at present. Suggestion was made at her initial clinic visit with [place] had an upper endoscopy may be necessary for this, but I do not think that is necessary.  This is not behaving like an ulcer or malignancy or gastritis.  The episodic nature favors biliary cause.  If she has recurrent episode of pain with elevated LFTs, then ERCP will need to be considered.  Labs did not show chronic viral hepatitis, but also she is not hepatitis B immune despite previous vaccination.  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  We gave her the first dose of hepatitis A/B combo vaccination today.  Plans will be made to complete vaccination series  I also discussed colon cancer screening with her, risk and benefits of Cologuard versus colonoscopy.  She elected to proceed with colonoscopy, which must be done in the hospital endoscopy lab due to her elevated BMI.   The benefits and risks of the planned procedure were described in detail with the patient or (when appropriate) their health care proxy.  Risks were outlined as including, but not limited to, bleeding, infection, perforation, adverse medication reaction leading to cardiac or pulmonary decompensation, pancreatitis (if ERCP).  The limitation of incomplete mucosal visualization was also discussed.  No guarantees or warranties were given.   40 minutes were spent on this encounter (including chart review, history/exam, counseling/coordination of care, and documentation)  Charlie Pitter III

## 2020-05-30 ENCOUNTER — Telehealth: Payer: Self-pay | Admitting: Internal Medicine

## 2020-05-30 ENCOUNTER — Other Ambulatory Visit: Payer: Self-pay

## 2020-05-30 ENCOUNTER — Telehealth: Payer: Self-pay | Admitting: Gastroenterology

## 2020-05-30 NOTE — Telephone Encounter (Signed)
LMOM for patient to call back.

## 2020-05-30 NOTE — Telephone Encounter (Signed)
I need to know when her symptoms started and what date she tested positive

## 2020-05-30 NOTE — Progress Notes (Signed)
Endo call to patient who states she tested positive for Covid today using a home test.  Patient waiting in line for PCR and advised to contact Dr. Myrtie Neither office for follow up.

## 2020-05-30 NOTE — Telephone Encounter (Signed)
Patient is covid positive and is requesting a order for the mono. Antibody infusion  Please advise EM

## 2020-05-31 ENCOUNTER — Other Ambulatory Visit: Payer: Self-pay | Admitting: Gastroenterology

## 2020-05-31 DIAGNOSIS — Z1211 Encounter for screening for malignant neoplasm of colon: Secondary | ICD-10-CM

## 2020-05-31 NOTE — Telephone Encounter (Signed)
LMOM for patient to call back.

## 2020-05-31 NOTE — Telephone Encounter (Signed)
Referral to [facility] placed

## 2020-05-31 NOTE — Telephone Encounter (Signed)
Symptoms: cough, fever, congested and fatigue.  Symptoms started: 05/28/2020 Tested positive on 05/30/[facility].   Also states that she was told when she got last refill on effexor that she needed appointment for physical have made that with.

## 2020-05-31 NOTE — Telephone Encounter (Signed)
Spoke to patient who reports testing positive for Covid on 05/30/20. Her symptoms are mild with nasal congestion and cough. Patient is scheduled for a colonoscopy on 06/12/20 at [facility]. Spoke with Gearldine Bienenstock RN in the Endo unit who states the patient can keep there appointment 10 days from Covid positive results  as long as they are symptom free. Patient has decided to cancel her procedure for 06/12/20. Her colonoscopy has been moved to 07/25/20 at 9:30 am [facility].

## 2020-06-01 ENCOUNTER — Telehealth: Payer: 59 | Admitting: Internal Medicine

## 2020-06-04 NOTE — Telephone Encounter (Signed)
[place],  Thank you for letting me know.  In light of that, I am sending you a separate message about schedule adjustment on another patient in that block.  - HD

## 2020-06-05 ENCOUNTER — Other Ambulatory Visit: Payer: Self-pay | Admitting: Internal Medicine

## 2020-06-05 NOTE — Telephone Encounter (Signed)
Noted  

## 2020-06-06 NOTE — Telephone Encounter (Signed)
Patient is calling in about being "completely out" of her  Venlafaxine.  Pharmacy [facility] [address] [place]

## 2020-06-08 ENCOUNTER — Inpatient Hospital Stay (HOSPITAL_COMMUNITY): Admission: RE | Admit: 2020-06-08 | Payer: 59 | Source: Ambulatory Visit

## 2020-06-10 ENCOUNTER — Other Ambulatory Visit (HOSPITAL_COMMUNITY): Payer: 59

## 2020-06-30 ENCOUNTER — Ambulatory Visit (INDEPENDENT_AMBULATORY_CARE_PROVIDER_SITE_OTHER): Payer: 59 | Admitting: Gastroenterology

## 2020-06-30 DIAGNOSIS — Z23 Encounter for immunization: Secondary | ICD-10-CM

## 2020-06-30 DIAGNOSIS — K760 Fatty (change of) liver, not elsewhere classified: Secondary | ICD-10-CM

## 2020-07-17 ENCOUNTER — Telehealth: Payer: Self-pay | Admitting: Gastroenterology

## 2020-07-17 NOTE — Telephone Encounter (Signed)
COVID test and procedure cancelled.

## 2020-07-21 ENCOUNTER — Other Ambulatory Visit (HOSPITAL_COMMUNITY): Payer: 59

## 2020-07-25 ENCOUNTER — Ambulatory Visit (HOSPITAL_COMMUNITY): Admit: 2020-07-25 | Payer: 59 | Admitting: Gastroenterology

## 2020-07-25 SURGERY — COLONOSCOPY WITH PROPOFOL
Anesthesia: Monitor Anesthesia Care

## 2020-07-28 ENCOUNTER — Encounter: Payer: 59 | Admitting: Internal Medicine

## 2020-09-06 ENCOUNTER — Telehealth: Payer: Self-pay | Admitting: Internal Medicine

## 2020-09-06 NOTE — Telephone Encounter (Signed)
  LAST APPOINTMENT DATE: 06/05/2020   NEXT APPOINTMENT DATE:@Visit  date not found  MEDICATION: Venlafaxine XR Patient would like to up the medication a little bit  PHARMACY: [facility] [address] [place]   Let patient know to contact pharmacy at the end of the day to make sure medication is ready.  Please notify patient to allow 48-72 hours to process  Encourage patient to contact the pharmacy for refills or they can request refills through [facility]  CLINICAL FILLS OUT ALL BELOW:   LAST REFILL:  QTY:  REFILL DATE:    OTHER COMMENTS:    Okay for refill?  Please advise

## 2020-09-07 NOTE — Telephone Encounter (Signed)
Can she set up a virtual visit to discuss?

## 2020-09-11 ENCOUNTER — Encounter: Payer: Self-pay | Admitting: Internal Medicine

## 2020-09-11 ENCOUNTER — Other Ambulatory Visit: Payer: Self-pay

## 2020-09-11 ENCOUNTER — Telehealth (INDEPENDENT_AMBULATORY_CARE_PROVIDER_SITE_OTHER): Payer: 59 | Admitting: Internal Medicine

## 2020-09-11 DIAGNOSIS — F419 Anxiety disorder, unspecified: Secondary | ICD-10-CM

## 2020-09-11 MED ORDER — VENLAFAXINE HCL ER 75 MG PO CP24: 75.0000 mg | ORAL_CAPSULE | Freq: Every day | ORAL | 2 refills | Status: DC

## 2020-09-11 NOTE — Patient Instructions (Signed)
[email or web address]">  Generalized Anxiety Disorder, Adult Generalized anxiety disorder (GAD) is a mental health condition. Unlike normal worries, anxiety related to GAD is not triggered by a specific event. These worries do not fade or get better with time. GAD interferes with relationships, work, and school. GAD symptoms can vary from mild to severe. People with severe GAD can have intense waves of anxiety with physical symptoms that are similar to panic attacks. What are the causes? The exact cause of GAD is not known, but the following are believed to have an impact:  Differences in natural brain chemicals.  Genes passed down from parents to children.  Differences in the way threats are perceived.  Development during childhood.  Personality. What increases the risk? The following factors may make you more likely to develop this condition:  Being female.  Having a family history of anxiety disorders.  Being very shy.  Experiencing very stressful life events, such as the death of a loved one.  Having a very stressful family environment. What are the signs or symptoms? People with GAD often worry excessively about many things in their lives, such as their health and family. Symptoms may also include:  Mental and emotional symptoms: ? Worrying excessively about natural disasters. ? Fear of being late. ? Difficulty concentrating. ? Fears that others are judging your performance.  Physical symptoms: ? Fatigue. ? Headaches, muscle tension, muscle twitches, trembling, or feeling shaky. ? Feeling like your heart is pounding or beating very fast. ? Feeling out of breath or like you cannot take a deep breath. ? Having trouble falling asleep or staying asleep, or experiencing restlessness. ? Sweating. ? Nausea, diarrhea, or irritable bowel syndrome (IBS).  Behavioral symptoms: ? Experiencing erratic moods or irritability. ? Avoidance of new situations. ? Avoidance of  people. ? Extreme difficulty making decisions. How is this diagnosed? This condition is diagnosed based on your symptoms and medical history. You will also have a physical exam. Your health care provider may perform tests to rule out other possible causes of your symptoms. To be diagnosed with GAD, a person must have anxiety that:  Is out of his or her control.  Affects several different aspects of his or her life, such as work and relationships.  Causes distress that makes him or her unable to take part in normal activities.  Includes at least three symptoms of GAD, such as restlessness, fatigue, trouble concentrating, irritability, muscle tension, or sleep problems. Before your health care provider can confirm a diagnosis of GAD, these symptoms must be present more days than they are not, and they must last for 6 months or longer. How is this treated? This condition may be treated with:  Medicine. Antidepressant medicine is usually prescribed for long-term daily control. Anti-anxiety medicines may be added in severe cases, especially when panic attacks occur.  Talk therapy (psychotherapy). Certain types of talk therapy can be helpful in treating GAD by providing support, education, and guidance. Options include: ? Cognitive behavioral therapy (CBT). People learn coping skills and self-calming techniques to ease their physical symptoms. They learn to identify unrealistic thoughts and behaviors and to replace them with more appropriate thoughts and behaviors. ? Acceptance and commitment therapy (ACT). This treatment teaches people how to be mindful as a way to cope with unwanted thoughts and feelings. ? Biofeedback. This process trains you to manage your body's response (physiological response) through breathing techniques and relaxation methods. You will work with a therapist while machines are used to monitor your physical   symptoms.  Stress management techniques. These include yoga,  meditation, and exercise. A mental health specialist can help determine which treatment is best for you. Some people see improvement with one type of therapy. However, other people require a combination of therapies.   Follow these instructions at home: Lifestyle  Maintain a consistent routine and schedule.  Anticipate stressful situations. Create a plan, and allow extra time to work with your plan.  Practice stress management or self-calming techniques that you have learned from your therapist or your health care provider. General instructions  Take over-the-counter and prescription medicines only as told by your health care provider.  Understand that you are likely to have setbacks. Accept this and be kind to yourself as you persist to take better care of yourself.  Recognize and accept your accomplishments, even if you judge them as small.  Keep all follow-up visits as told by your health care provider. This is important. Contact a health care provider if:  Your symptoms do not get better.  Your symptoms get worse.  You have signs of depression, such as: ? A persistently sad or irritable mood. ? Loss of enjoyment in activities that used to bring you joy. ? Change in weight or eating. ? Changes in sleeping habits. ? Avoiding friends or family members. ? Loss of energy for normal tasks. ? Feelings of guilt or worthlessness. Get help right away if:  You have serious thoughts about hurting yourself or others. If you ever feel like you may hurt yourself or others, or have thoughts about taking your own life, get help right away. Go to your nearest emergency department or:  Call your local emergency services (911 in the [place]  Call a suicide crisis helpline, such as the National Suicide Prevention Lifeline at 1-800-273-8255. This is open 24 hours a day in the [place]  Text the Crisis Text Line at 741741 (in the [place] Summary  Generalized anxiety disorder (GAD) is a mental  health condition that involves worry that is not triggered by a specific event.  People with GAD often worry excessively about many things in their lives, such as their health and family.  GAD may cause symptoms such as restlessness, trouble concentrating, sleep problems, frequent sweating, nausea, diarrhea, headaches, and trembling or muscle twitching.  A mental health specialist can help determine which treatment is best for you. Some people see improvement with one type of therapy. However, other people require a combination of therapies. This information is not intended to replace advice given to you by your health care provider. Make sure you discuss any questions you have with your health care provider. Document Revised: 03/10/2019 Document Reviewed: 03/10/2019 Elsevier Patient Education  2021 Elsevier Inc.  

## 2020-09-11 NOTE — Progress Notes (Signed)
Virtual Visit via Video Note  I connected with Jody Jensen on 09/11/20 at 11:45 AM EDT by a video enabled telemedicine application and verified that I am speaking with the correct person using two identifiers.  Location: Patient: Work Provider: Herbalist participating in this video call: Nicki Reaper NP-C and Midwife.   I discussed the limitations of evaluation and management by telemedicine and the availability of in person appointments. The patient expressed understanding and agreed to proceed.  History of Present Illness:  Pt due to follow up anxiety. She is currently taking Venlafaxine and requesting that her dose be increased due to increased stress at work. She is having some difficulty sleeping but denies issues with appetite or depression. She is not currently seeing a therapist. She denies SI/HI.    Past Medical History:  Diagnosis Date  . Anxiety   . Diabetes mellitus without complication (HCC)   . Hyperlipidemia   . Hypertension   . Obesity   . OSA (obstructive sleep apnea)   . Ovarian cyst   . Sleep apnea     Current Outpatient Medications  Medication Sig Dispense Refill  . ADVAIR DISKUS 250-50 MCG/DOSE AEPB Inhale 1 puff into the lungs 2 (two) times daily.    Marland Kitchen albuterol (ACCUNEB) 0.63 MG/3ML nebulizer solution Take 1 ampule by nebulization every 6 (six) hours as needed for wheezing.    Marland Kitchen albuterol (VENTOLIN HFA) 108 (90 Base) MCG/ACT inhaler Inhale 2 puffs into the lungs every 4 (four) hours as needed.    . ALPRAZolam (XANAX) 0.25 MG tablet Take 30 minutes before procedure 1 tablet 0  . Cholecalciferol (HM VITAMIN D3) 50 MCG (2000 UT) CAPS Take by mouth.    . famotidine (PEPCID) 20 MG tablet Take 1 tablet (20 mg total) by mouth 2 (two) times daily. 60 tablet 0  . Fluticasone-Salmeterol (ADVAIR DISKUS IN) Inhale 1 puff into the lungs daily as needed.    . hydrochlorothiazide (HYDRODIURIL) 25 MG tablet Take 1 tablet (25 mg total) by mouth daily. 30  tablet 2  . levocetirizine (XYZAL) 5 MG tablet Take 5 mg by mouth every morning.    . metoprolol tartrate (LOPRESSOR) 100 MG tablet Take 100 mg 2 hours before Coronary CT 1 tablet 0  . montelukast (SINGULAIR) 10 MG tablet Take 1 tablet by mouth as needed.    Marland Kitchen PEG-KCl-NaCl-NaSulf-Na Asc-C (PLENVU) 140 g SOLR Take 140 g by mouth as directed. Manufacturer's coupon Universal coupon code:BIN: G6837245; GROUP: [id number]; PCN: [id number]; ID: [id number]; PAY NO MORE $50 1 each 0  . predniSONE (DELTASONE) 20 MG tablet Take 20 mg by mouth 2 (two) times daily.    Marland Kitchen venlafaxine XR (EFFEXOR-XR) 37.5 MG 24 hr capsule TAKE 1 CAPSULE BY MOUTH ONCE DAILY WITH BREAKFAST 90 capsule 0  . Vitamin D, Ergocalciferol, (DRISDOL) 1.25 MG (50000 UNIT) CAPS capsule Take 1 capsule (50,000 Units total) by mouth every 30 (thirty) days. SCHEDULE PHYSICAL 3 capsule 0   No current facility-administered medications for this visit.    No Known Allergies  Family History  Problem Relation Age of Onset  . Hypertension Mother   . Hyperlipidemia Mother   . Heart disease Mother   . Diabetes Mother   . Asthma Mother   . Thyroid disease Mother   . Lupus Mother   . Immunodeficiency Mother   . Heart disease Father   . Hypertension Father   . Cancer Paternal Grandmother        cervical  . Diabetes Paternal  Grandfather   . Stroke Paternal Grandfather   . Diabetes Paternal Aunt   . Prostate cancer Paternal Uncle   . Heart disease Paternal Uncle     Social History   Socioeconomic History  . Marital status: Married    Spouse name: Not on file  . Number of children: 2  . Years of education: Not on file  . Highest education level: Not on file  Occupational History  . Not on file  Tobacco Use  . Smoking status: Former Smoker    Types: Cigarettes    Quit date: 02/03/2012    Years since quitting: 8.6  . Smokeless tobacco: Never Used  Vaping Use  . Vaping Use: Some days  Substance and Sexual Activity  . Alcohol use: Yes     Comment: rare  . Drug use: No  . Sexual activity: Yes    Birth control/protection: Surgical  Other Topics Concern  . Not on file  Social History Narrative  . Not on file   Social Determinants of Health   Financial Resource Strain: Not on file  Food Insecurity: Not on file  Transportation Needs: Not on file  Physical Activity: Not on file  Stress: Not on file  Social Connections: Not on file  Intimate Partner Violence: Not on file     Constitutional: Denies fever, malaise, fatigue, headache or abrupt weight changes.  Respiratory: Denies difficulty breathing, shortness of breath, cough or sputum production.   Cardiovascular: Denies chest pain, chest tightness, palpitations or swelling in the hands or feet.  Neurological: Pt reports insomnia. Denies dizziness, difficulty with memory, difficulty with speech or problems with balance and coordination.  Psych: Pt reports anxiety. Denies depression, SI/HI.  No other specific complaints in a complete review of systems (except as listed in HPI above).  Observations/Objective:  LMP 11/02/1997  Wt Readings from Last 3 Encounters:  05/19/20 293 lb 8 oz (133.1 kg)  04/20/20 287 lb (130.2 kg)  03/31/20 289 lb (131.1 kg)    General: Appears her stated age, obese, in NAD. Pulmonary/Chest: Normal effort.. No respiratory distress. Neurological: Alert and oriented.  Psychiatric: Mood and affect normal. Behavior is normal. Judgment and thought content normal.     BMET    Component Value Date/Time   NA 141 04/20/2020 0957   K 4.2 04/20/2020 0957   CL 103 04/20/2020 0957   CO2 23 04/20/2020 0957   GLUCOSE 86 04/20/2020 0957   GLUCOSE 89 03/31/2020 1138   BUN 11 04/20/2020 0957   CREATININE 0.73 04/20/2020 0957   CREATININE 0.75 07/16/2019 1551   CALCIUM 9.0 04/20/2020 0957   GFRNONAA 98 04/20/2020 0957   GFRAA 113 04/20/2020 0957    Lipid Panel     Component Value Date/Time   CHOL 187 07/16/2019 1551   TRIG 95 07/16/2019  1551   HDL 53 07/16/2019 1551   CHOLHDL 3.5 07/16/2019 1551   VLDL 20.6 09/01/2015 0815   LDLCALC 114 (H) 07/16/2019 1551    CBC    Component Value Date/Time   WBC 6.3 02/22/2020 1622   RBC 4.58 02/22/2020 1622   HGB 13.3 02/22/2020 1622   HCT 40.6 02/22/2020 1622   PLT 204 02/22/2020 1622   MCV 88.6 02/22/2020 1622   MCH 29.0 02/22/2020 1622   MCHC 32.8 02/22/2020 1622   RDW 14.2 02/22/2020 1622   LYMPHSABS 2.7 12/02/2012 1521   MONOABS 0.5 12/02/2012 1521   EOSABS 0.2 12/02/2012 1521   BASOSABS 0.0 12/02/2012 1521    Hgb  A1C Lab Results  Component Value Date   HGBA1C 5.8 (H) 07/16/2019       Assessment and Plan:   Follow Up Instructions:    I discussed the assessment and treatment plan with the patient. The patient was provided an opportunity to ask questions and all were answered. The patient agreed with the plan and demonstrated an understanding of the instructions.   The patient was advised to call back or seek an in-person evaluation if the symptoms worsen or if the condition fails to improve as anticipated.    Nicki Reaper, NP

## 2020-09-11 NOTE — Assessment & Plan Note (Signed)
Deteriorated due to work stress Will increase Venlafaxine to 75 mg PO daily, RX sent to pharmacy Support offered  Update me in 3 weeks via mychart and let me know how things are going

## 2020-09-21 ENCOUNTER — Encounter: Payer: 59 | Admitting: Internal Medicine

## 2020-10-06 ENCOUNTER — Other Ambulatory Visit: Payer: Self-pay

## 2020-10-06 ENCOUNTER — Ambulatory Visit (INDEPENDENT_AMBULATORY_CARE_PROVIDER_SITE_OTHER): Payer: 59 | Admitting: Internal Medicine

## 2020-10-06 ENCOUNTER — Encounter: Payer: Self-pay | Admitting: Internal Medicine

## 2020-10-06 VITALS — BP 141/76 | HR 72 | Temp 97.3°F | Resp 17 | Ht 63.0 in | Wt 292.6 lb

## 2020-10-06 DIAGNOSIS — F419 Anxiety disorder, unspecified: Secondary | ICD-10-CM

## 2020-10-06 DIAGNOSIS — R7989 Other specified abnormal findings of blood chemistry: Secondary | ICD-10-CM | POA: Diagnosis not present

## 2020-10-06 DIAGNOSIS — M150 Primary generalized (osteo)arthritis: Secondary | ICD-10-CM

## 2020-10-06 DIAGNOSIS — M8949 Other hypertrophic osteoarthropathy, multiple sites: Secondary | ICD-10-CM | POA: Diagnosis not present

## 2020-10-06 DIAGNOSIS — G4733 Obstructive sleep apnea (adult) (pediatric): Secondary | ICD-10-CM

## 2020-10-06 DIAGNOSIS — K2100 Gastro-esophageal reflux disease with esophagitis, without bleeding: Secondary | ICD-10-CM

## 2020-10-06 DIAGNOSIS — J4530 Mild persistent asthma, uncomplicated: Secondary | ICD-10-CM

## 2020-10-06 DIAGNOSIS — Z0001 Encounter for general adult medical examination with abnormal findings: Secondary | ICD-10-CM | POA: Diagnosis not present

## 2020-10-06 DIAGNOSIS — Z1211 Encounter for screening for malignant neoplasm of colon: Secondary | ICD-10-CM | POA: Diagnosis not present

## 2020-10-06 DIAGNOSIS — I10 Essential (primary) hypertension: Secondary | ICD-10-CM | POA: Diagnosis not present

## 2020-10-06 DIAGNOSIS — G8929 Other chronic pain: Secondary | ICD-10-CM

## 2020-10-06 DIAGNOSIS — Z114 Encounter for screening for human immunodeficiency virus [HIV]: Secondary | ICD-10-CM

## 2020-10-06 DIAGNOSIS — M159 Polyosteoarthritis, unspecified: Secondary | ICD-10-CM

## 2020-10-06 MED ORDER — HYDROCHLOROTHIAZIDE 25 MG PO TABS: 25.0000 mg | ORAL_TABLET | Freq: Every day | ORAL | 3 refills | Status: DC

## 2020-10-06 MED ORDER — FAMOTIDINE 20 MG PO TABS: 20.0000 mg | ORAL_TABLET | Freq: Every day | ORAL | 3 refills | Status: DC

## 2020-10-06 MED ORDER — VENLAFAXINE HCL ER 75 MG PO CP24: 75.0000 mg | ORAL_CAPSULE | Freq: Every day | ORAL | 3 refills | Status: DC

## 2020-10-06 NOTE — Patient Instructions (Signed)

## 2020-10-06 NOTE — Progress Notes (Signed)
Subjective:    Patient ID: Jody Jensen, female    DOB: 04/25/1971, 50 y.o.   MRN: [id number]  HPI  Patient presents to clinic today for her annual exam.  She is also due to follow-up chronic conditions.  HTN: Her BP today is 141/6. She is having some lower extremity edema, takes HCTZ as needed. ECG from 04/2020 reviewed.  OA: Mainly in her lower back and left knee. She reports her lower back is not currently bothering her, only if she stands for too long, sweeps/vacuums etc. She did see [facility] recently for evaluation of her knee pain, reports her x-ray was fine.  No intervention was given.  She takes Ibuprofen/Tylenol OTC as needed  Anxiety: Persistent, managed on Venlafaxine.  She is not currently seeing a therapist. She denies SI/HI.  GERD: Intermittent, not occurring 3 x week. She is having some difficulty swallowing some meats and bread. She takes Famotadine as needed with good relief of symptoms. There is no upper GI on file.  Asthma: Managed with Advair and Albuterol as needed. There are no PFT's on file. She does not follolw with pulmonology.  OSA: She does not wear a CPAP, managed with a mouth guard. She does feel like this appliance is working well for her. There is no sleep study on file.   Elevated Liver Enzymes: She has a history of fatty liver. She has seen GI for the same. Dr. Loletha Carrow felt like she may have passed a gallstone s/p prior cholecystectomy. She denies any RUQ abdominal pain at this time.   Flu: 02/2020 Tetanus: 07/2019 Covid: Pfizer x 2 Pap Smear: partial hysterectomy Mammogram: 08/2019 Colon Screening: never Vision Screening: annually Dentist: biannually  Diet: She does eat meat. She consumes fruits and veggies daily. She tries to avoid fried foods. She drinks mostly water, sweet tea, coffee. Exercise: None  Review of Systems  Past Medical History:  Diagnosis Date  . Anxiety   . Diabetes mellitus without complication ([place])   . Hyperlipidemia    . Hypertension   . Obesity   . OSA (obstructive sleep apnea)   . Ovarian cyst   . Sleep apnea     Current Outpatient Medications  Medication Sig Dispense Refill  . ADVAIR DISKUS 250-50 MCG/DOSE AEPB Inhale 1 puff into the lungs 2 (two) times daily.    Marland Kitchen albuterol (ACCUNEB) 0.63 MG/3ML nebulizer solution Take 1 ampule by nebulization every 6 (six) hours as needed for wheezing.    Marland Kitchen albuterol (VENTOLIN HFA) 108 (90 Base) MCG/ACT inhaler Inhale 2 puffs into the lungs every 4 (four) hours as needed.    . Cholecalciferol (HM VITAMIN D3) 50 MCG (2000 UT) CAPS Take by mouth.    . famotidine (PEPCID) 20 MG tablet Take 1 tablet (20 mg total) by mouth 2 (two) times daily. 60 tablet 0  . Fluticasone-Salmeterol (ADVAIR DISKUS IN) Inhale 1 puff into the lungs daily as needed.    . hydrochlorothiazide (HYDRODIURIL) 25 MG tablet Take 1 tablet (25 mg total) by mouth daily. 30 tablet 2  . levocetirizine (XYZAL) 5 MG tablet Take 5 mg by mouth every morning.    . metoprolol tartrate (LOPRESSOR) 100 MG tablet Take 100 mg 2 hours before Coronary CT 1 tablet 0  . montelukast (SINGULAIR) 10 MG tablet Take 1 tablet by mouth as needed.    Marland Kitchen PEG-KCl-NaCl-NaSulf-Na Asc-C (PLENVU) 140 g SOLR Take 140 g by mouth as directed. Manufacturer's coupon Universal coupon code:BIN: P2366821; GROUP: [id number]; PCNPryor Curia; ID: [id number];  PAY NO MORE $50 1 each 0  . predniSONE (DELTASONE) 20 MG tablet Take 20 mg by mouth 2 (two) times daily.    Marland Kitchen venlafaxine XR (EFFEXOR XR) 75 MG 24 hr capsule Take 1 capsule (75 mg total) by mouth daily with breakfast. 30 capsule 2   No current facility-administered medications for this visit.    No Known Allergies  Family History  Problem Relation Age of Onset  . Hypertension Mother   . Hyperlipidemia Mother   . Heart disease Mother   . Diabetes Mother   . Asthma Mother   . Thyroid disease Mother   . Lupus Mother   . Immunodeficiency Mother   . Heart disease Father   . Hypertension  Father   . Cancer Paternal Grandmother        cervical  . Diabetes Paternal Grandfather   . Stroke Paternal Grandfather   . Diabetes Paternal Aunt   . Prostate cancer Paternal Uncle   . Heart disease Paternal Uncle     Social History   Socioeconomic History  . Marital status: Married    Spouse name: Not on file  . Number of children: 2  . Years of education: Not on file  . Highest education level: Not on file  Occupational History  . Not on file  Tobacco Use  . Smoking status: Former Smoker    Types: Cigarettes    Quit date: 02/03/2012    Years since quitting: 8.6  . Smokeless tobacco: Never Used  Vaping Use  . Vaping Use: Some days  Substance and Sexual Activity  . Alcohol use: Yes    Comment: rare  . Drug use: No  . Sexual activity: Yes    Birth control/protection: Surgical  Other Topics Concern  . Not on file  Social History Narrative  . Not on file   Social Determinants of Health   Financial Resource Strain: Not on file  Food Insecurity: Not on file  Transportation Needs: Not on file  Physical Activity: Not on file  Stress: Not on file  Social Connections: Not on file  Intimate Partner Violence: Not on file     Constitutional: Denies fever, malaise, fatigue, headache or abrupt weight changes.  HEENT: Denies eye pain, eye redness, ear pain, ringing in the ears, wax buildup, runny nose, nasal congestion, bloody nose, or sore throat. Respiratory: Denies difficulty breathing, shortness of breath, cough or sputum production.   Cardiovascular: Denies chest pain, chest tightness, palpitations or swelling in the hands or feet.  Gastrointestinal: Patient reports intermittent reflux, difficulty swallowing.  Denies abdominal pain, bloating, constipation, diarrhea or blood in the stool.  GU: Denies urgency, frequency, pain with urination, burning sensation, blood in urine, odor or discharge. Musculoskeletal: Patient reports intermittent back and knee pain.  Denies  decrease in range of motion, difficulty with gait, muscle pain or joint swelling.  Skin: Denies redness, rashes, lesions or ulcercations.  Neurological: Denies dizziness, difficulty with memory, difficulty with speech or problems with balance and coordination.  Psych: Patient has a history of anxiety.  Denies depression, SI/HI.  No other specific complaints in a complete review of systems (except as listed in HPI above).     Objective:   Physical Exam  BP (!) 141/76 (BP Location: Left Arm, Patient Position: Sitting, Cuff Size: Large)   Pulse 72   Temp (!) 97.3 F (36.3 C) (Temporal)   Resp 17   Ht _0  (1.6 m)   Wt 132.7 kg   LMP 11/02/1997  SpO2 100%   BMI 51.83 kg/m   Wt Readings from Last 3 Encounters:  05/19/20 293 lb 8 oz (133.1 kg)  04/20/20 287 lb (130.2 kg)  03/31/20 289 lb (131.1 kg)    General: Appears her stated age, obese, in NAD. Skin: Warm, dry and intact. No rashes or ulcerations noted. HEENT: Head: normal shape and size; Eyes: sclera white and EOMs intact;  Neck:  Neck supple, trachea midline. No masses, lumps or thyromegaly present.  Cardiovascular: Normal rate and rhythm. S1,S2 noted.  No murmur, rubs or gallops noted.  Trace nonpitting BLE edema. Pulmonary/Chest: Normal effort and positive vesicular breath sounds. No respiratory distress. No wheezes, rales or ronchi noted.  Abdomen: Soft and nontender. Normal bowel sounds. No distention or masses noted. Liver, spleen and kidneys non palpable. Musculoskeletal: Strength 5/5 BUE/BLE.  No tenderness with palpation along the spine.  Normal flexion and extension of the left knee.  Pain with palpation along the lateral joint line.  Negative Lachman's.  Negative McMurray.  No difficulty with gait.  Neurological: Alert and oriented. Cranial nerves II-XII grossly intact. Coordination normal.  Psychiatric: Mood and affect normal. Behavior is normal. Judgment and thought content normal.     BMET    Component Value  Date/Time   NA 141 04/20/2020 0957   K 4.2 04/20/2020 0957   CL 103 04/20/2020 0957   CO2 23 04/20/2020 0957   GLUCOSE 86 04/20/2020 0957   GLUCOSE 89 03/31/2020 1138   BUN 11 04/20/2020 0957   CREATININE 0.73 04/20/2020 0957   CREATININE 0.75 07/16/2019 1551   CALCIUM 9.0 04/20/2020 0957   GFRNONAA 98 04/20/2020 0957   GFRAA 113 04/20/2020 0957    Lipid Panel     Component Value Date/Time   CHOL 187 07/16/2019 1551   TRIG 95 07/16/2019 1551   HDL 53 07/16/2019 1551   CHOLHDL 3.5 07/16/2019 1551   VLDL 20.6 09/01/2015 0815   LDLCALC 114 (H) 07/16/2019 1551    CBC    Component Value Date/Time   WBC 6.3 02/22/2020 1622   RBC 4.58 02/22/2020 1622   HGB 13.3 02/22/2020 1622   HCT 40.6 02/22/2020 1622   PLT 204 02/22/2020 1622   MCV 88.6 02/22/2020 1622   MCH 29.0 02/22/2020 1622   MCHC 32.8 02/22/2020 1622   RDW 14.2 02/22/2020 1622   LYMPHSABS 2.7 12/02/2012 1521   MONOABS 0.5 12/02/2012 1521   EOSABS 0.2 12/02/2012 1521   BASOSABS 0.0 12/02/2012 1521    Hgb A1C Lab Results  Component Value Date   HGBA1C 5.8 (H) 07/16/2019           Assessment & Plan:   Preventative Health Maintenance:  Encouraged her to get a flu shot in the fall Tetanus UTD Encouraged her to get her COVID booster She no longer needs Pap smears She will call to schedule her mammogram Referral to GI for screening colonoscopy Encouraged her to consume a balanced diet and exercise regimen Advised her to see an eye doctor and dentist annually We will check CBC, c-Met, TSH, lipid, A1c and HIV today  RTC in 1 year, sooner if needed Webb Silversmith, NP This visit occurred during the SARS-CoV-2 public health emergency.  Safety protocols were in place, including screening questions prior to the visit, additional usage of staff PPE, and extensive cleaning of exam room while observing appropriate contact time as indicated for disinfecting solutions.

## 2020-10-07 ENCOUNTER — Encounter: Payer: Self-pay | Admitting: Internal Medicine

## 2020-10-07 DIAGNOSIS — J45909 Unspecified asthma, uncomplicated: Secondary | ICD-10-CM | POA: Insufficient documentation

## 2020-10-07 DIAGNOSIS — K2100 Gastro-esophageal reflux disease with esophagitis, without bleeding: Secondary | ICD-10-CM | POA: Insufficient documentation

## 2020-10-07 NOTE — Assessment & Plan Note (Signed)
Given that her blood pressure is elevated today along with lower extremity edema.  I would like her to start taking HCTZ daily instead of as needed HCTZ refilled today Reinforced DASH diet and exercise for weight loss Encouraged elevation of legs She will schedule lab only appointment for c-Met  RTC in 2 weeks for nurse visit BP check

## 2020-10-07 NOTE — Assessment & Plan Note (Signed)
Encouraged weight loss to reduce fatty liver Followed by GI, will follow

## 2020-10-07 NOTE — Assessment & Plan Note (Signed)
Given the fact that she is having difficulty swallowing, advised her to start Famotidine daily If symptoms persist, will refer back to GI for upper endoscopy

## 2020-10-07 NOTE — Assessment & Plan Note (Signed)
Advised her to take Ibuprofen OTC as needed for joint pain Encourage regular physical activity Encouraged weight loss as this can help reduce reflux symptoms

## 2020-10-07 NOTE — Assessment & Plan Note (Signed)
Managed with dental appliance Encouraged weight loss as this can help reduce sleep apnea symptoms Will monitor

## 2020-10-07 NOTE — Assessment & Plan Note (Signed)
Continue Advair and Albuterol Will monitor symptoms 

## 2020-10-07 NOTE — Assessment & Plan Note (Signed)
Stable on current dose of Venlafaxine She is not interested in weaning this medication at this time Venlafaxine refilled today Support offered

## 2020-10-19 ENCOUNTER — Other Ambulatory Visit: Payer: Self-pay | Admitting: *Deleted

## 2020-10-19 DIAGNOSIS — Z0001 Encounter for general adult medical examination with abnormal findings: Secondary | ICD-10-CM

## 2020-10-19 DIAGNOSIS — Z114 Encounter for screening for human immunodeficiency virus [HIV]: Secondary | ICD-10-CM

## 2020-10-20 ENCOUNTER — Other Ambulatory Visit: Payer: Self-pay

## 2020-10-20 ENCOUNTER — Other Ambulatory Visit: Payer: 59

## 2020-10-20 NOTE — Progress Notes (Signed)
The pt came in the office today for lab draw and blood pressure recheck

## 2020-10-23 LAB — LIPID PANEL
Cholesterol: 184 mg/dL (ref <200)
HDL: 65 mg/dL (ref >=50)
LDL Cholesterol (Calc): 101 mg/dL (calc) — ABNORMAL HIGH
Non-HDL Cholesterol (Calc): 119 mg/dL (calc) (ref <130)
Total CHOL/HDL Ratio: 2.8 (calc) (ref <5.0)
Triglycerides: 86 mg/dL (ref <150)

## 2020-10-23 LAB — COMPREHENSIVE METABOLIC PANEL
AG Ratio: 1.7 (calc) (ref 1.0–2.5)
ALT: 30 U/L — ABNORMAL HIGH (ref 6–29)
AST: 23 U/L (ref 10–35)
Albumin: 3.9 g/dL (ref 3.6–5.1)
Alkaline phosphatase (APISO): 88 U/L (ref 31–125)
BUN: 13 mg/dL (ref 7–25)
CO2: 31 mmol/L (ref 20–32)
Calcium: 8.9 mg/dL (ref 8.6–10.2)
Chloride: 104 mmol/L (ref 98–110)
Creat: 0.79 mg/dL (ref 0.50–1.10)
Globulin: 2.3 g/dL (calc) (ref 1.9–3.7)
Glucose, Bld: 102 mg/dL — ABNORMAL HIGH (ref 65–99)
Potassium: 4 mmol/L (ref 3.5–5.3)
Sodium: 140 mmol/L (ref 135–146)
Total Bilirubin: 0.4 mg/dL (ref 0.2–1.2)
Total Protein: 6.2 g/dL (ref 6.1–8.1)

## 2020-10-23 LAB — CBC
HCT: 44.4 % (ref 35.0–45.0)
Hemoglobin: 14 g/dL (ref 11.7–15.5)
MCH: 29 pg (ref 27.0–33.0)
MCHC: 31.5 g/dL — ABNORMAL LOW (ref 32.0–36.0)
MCV: 92.1 fL (ref 80.0–100.0)
MPV: 9.7 fL (ref 7.5–12.5)
Platelets: 184 10*3/uL (ref 140–400)
RBC: 4.82 10*6/uL (ref 3.80–5.10)
RDW: 13.6 % (ref 11.0–15.0)
WBC: 4.8 10*3/uL (ref 3.8–10.8)

## 2020-10-23 LAB — TSH: TSH: 1.01 mIU/L

## 2020-10-23 LAB — HIV ANTIBODY (ROUTINE TESTING W REFLEX): HIV 1&2 Ab, 4th Generation: NONREACTIVE

## 2020-10-23 NOTE — Progress Notes (Signed)
Bp controlled. Continue current meds

## 2020-11-24 ENCOUNTER — Ambulatory Visit (INDEPENDENT_AMBULATORY_CARE_PROVIDER_SITE_OTHER): Payer: 59 | Admitting: Gastroenterology

## 2020-11-24 DIAGNOSIS — Z23 Encounter for immunization: Secondary | ICD-10-CM | POA: Diagnosis not present

## 2020-12-29 ENCOUNTER — Other Ambulatory Visit: Payer: Self-pay

## 2020-12-29 ENCOUNTER — Emergency Department: Payer: 59

## 2020-12-29 DIAGNOSIS — J45909 Unspecified asthma, uncomplicated: Secondary | ICD-10-CM | POA: Insufficient documentation

## 2020-12-29 DIAGNOSIS — Z79899 Other long term (current) drug therapy: Secondary | ICD-10-CM | POA: Diagnosis not present

## 2020-12-29 DIAGNOSIS — E785 Hyperlipidemia, unspecified: Secondary | ICD-10-CM | POA: Insufficient documentation

## 2020-12-29 DIAGNOSIS — R0789 Other chest pain: Secondary | ICD-10-CM | POA: Insufficient documentation

## 2020-12-29 DIAGNOSIS — E1169 Type 2 diabetes mellitus with other specified complication: Secondary | ICD-10-CM | POA: Insufficient documentation

## 2020-12-29 DIAGNOSIS — R059 Cough, unspecified: Secondary | ICD-10-CM | POA: Diagnosis present

## 2020-12-29 DIAGNOSIS — Z87891 Personal history of nicotine dependence: Secondary | ICD-10-CM | POA: Diagnosis not present

## 2020-12-29 LAB — CBC
HCT: 41.4 % (ref 36.0–46.0)
Hemoglobin: 13.3 g/dL (ref 12.0–15.0)
MCH: 29.5 pg (ref 26.0–34.0)
MCHC: 32.1 g/dL (ref 30.0–36.0)
MCV: 91.8 fL (ref 80.0–100.0)
Platelets: 192 10*3/uL (ref 150–400)
RBC: 4.51 MIL/uL (ref 3.87–5.11)
RDW: 14.5 % (ref 11.5–15.5)
WBC: 8.2 10*3/uL (ref 4.0–10.5)
nRBC: 0 % (ref 0.0–0.2)

## 2020-12-29 LAB — COMPREHENSIVE METABOLIC PANEL
ALT: 65 U/L — ABNORMAL HIGH (ref 0–44)
AST: 31 U/L (ref 15–41)
Albumin: 3.5 g/dL (ref 3.5–5.0)
Alkaline Phosphatase: 84 U/L (ref 38–126)
Anion gap: 6 (ref 5–15)
BUN: 14 mg/dL (ref 6–20)
CO2: 32 mmol/L (ref 22–32)
Calcium: 9.1 mg/dL (ref 8.9–10.3)
Chloride: 102 mmol/L (ref 98–111)
Creatinine, Ser: 0.76 mg/dL (ref 0.44–1.00)
GFR, Estimated: >60 mL/min (ref >60)
Glucose, Bld: 100 mg/dL — ABNORMAL HIGH (ref 70–99)
Potassium: 3.7 mmol/L (ref 3.5–5.1)
Sodium: 140 mmol/L (ref 135–145)
Total Bilirubin: 0.6 mg/dL (ref 0.3–1.2)
Total Protein: 6.3 g/dL — ABNORMAL LOW (ref 6.5–8.1)

## 2020-12-29 LAB — TROPONIN I (HIGH SENSITIVITY): Troponin I (High Sensitivity): 7 ng/L (ref <18)

## 2020-12-29 NOTE — ED Triage Notes (Signed)
Pt states three weeks of cough, worsening shob and chest pain. Pt states she was tested negative for covid. Pt with strong dry cough in triage.

## 2020-12-30 ENCOUNTER — Emergency Department
Admission: EM | Admit: 2020-12-30 | Discharge: 2020-12-30 | Disposition: A | Discharge status: Home / Self Care | Payer: 59 | Attending: Emergency Medicine | Admitting: Emergency Medicine

## 2020-12-30 DIAGNOSIS — R059 Cough, unspecified: Secondary | ICD-10-CM

## 2020-12-30 LAB — TROPONIN I (HIGH SENSITIVITY): Troponin I (High Sensitivity): 6 ng/L (ref <18)

## 2020-12-30 MED ORDER — HYDROCOD POLST-CPM POLST ER 10-8 MG/5ML PO SUER: 5.0000 mL | Freq: Two times a day (BID) | ORAL | 0 refills | Status: DC | PRN

## 2020-12-30 NOTE — ED Provider Notes (Signed)
[facility] Emergency Department Provider Note  ____________________________________________   Event Date/Time   First MD Initiated Contact with Patient 12/30/20 [phone]     (approximate)  I have reviewed the triage vital signs and the nursing notes.   HISTORY  Chief Complaint Shortness of Breath    HPI Jody Jensen is a 50 y.o. female with history of obesity, hypertension, diabetes, hyperlipidemia who presents to the emergency department with complaints of cough for 2 weeks that is nonproductive.  Patient has been seen by her primary care doctor and placed on 2 rounds of steroids and cefdinir.  She states she has had multiple negative COVID-19 test.  She denies history of asthma, COPD but is a smoker.  States she has not been smoking or vaping since the symptoms started.  She has tried [employer] for relief without help.  She states her cough is worse at night and she is having a hard time getting any sleep.  Today she felt like she had something stuck in the center of her chest and she could not cough it up.  She states it felt like a pressure.  No known history of CAD.  No history of PE, DVT, exogenous estrogen use, recent fractures, surgery, trauma, hospitalization, prolonged travel or other immobilization. No lower extremity swelling or pain. No calf tenderness.        Past Medical History:  Diagnosis Date   Anxiety    Diabetes mellitus without complication ([facility])    Hyperlipidemia    Hypertension    Obesity    OSA (obstructive sleep apnea)    Ovarian cyst    Sleep apnea     Patient Active Problem List   Diagnosis Date Noted   GERD with esophagitis 10/07/2020   Asthma 10/07/2020   Elevated LFTs 03/31/2020   OA (osteoarthritis) 04/22/2019   Anxiety 04/22/2019   HTN (hypertension) 04/22/2019   OSA (obstructive sleep apnea) 12/02/2012    Past Surgical History:  Procedure Laterality Date   ABDOMINAL HYSTERECTOMY      partial, ovaries  remain, no cervix   bone spur Right    BREAST SURGERY     benign breast cyst removal   CESAREAN SECTION     CHOLECYSTECTOMY     NASAL SINUS SURGERY     ovarian cyst removal     TUBAL LIGATION      Prior to Admission medications   Medication Sig Start Date End Date Taking? Authorizing Provider  chlorpheniramine-HYDROcodone (TUSSIONEX PENNKINETIC ER) 10-8 MG/5ML SUER Take 5 mLs by mouth every 12 (twelve) hours as needed for cough. 12/30/20  Yes Tekela Garguilo N, DO  ADVAIR DISKUS 250-50 MCG/DOSE AEPB Inhale 1 puff into the lungs 2 (two) times daily. 01/02/20   [provider]  albuterol (ACCUNEB) 0.63 MG/3ML nebulizer solution Take 1 ampule by nebulization every 6 (six) hours as needed for wheezing.    [provider]  albuterol (VENTOLIN HFA) 108 (90 Base) MCG/ACT inhaler Inhale 2 puffs into the lungs every 4 (four) hours as needed. 12/09/19   [provider]  famotidine (PEPCID) 20 MG tablet Take 1 tablet (20 mg total) by mouth at bedtime. 10/06/20   Lorre Munroe, NP  hydrochlorothiazide (HYDRODIURIL) 25 MG tablet Take 1 tablet (25 mg total) by mouth daily. 10/06/20   Lorre Munroe, NP  levocetirizine (XYZAL) 5 MG tablet Take 5 mg by mouth every morning. 01/06/20   [provider]  montelukast (SINGULAIR) 10 MG tablet Take 1  tablet by mouth as needed. 01/06/20   [provider]  venlafaxine XR (EFFEXOR XR) 75 MG 24 hr capsule Take 1 capsule (75 mg total) by mouth daily with breakfast. 10/06/20   Lorre Munroe, NP    Allergies Patient has no known allergies.  Family History  Problem Relation Age of Onset   Hypertension Mother    Hyperlipidemia Mother    Heart disease Mother    Diabetes Mother    Asthma Mother    Thyroid disease Mother    Lupus Mother    Immunodeficiency Mother    Heart disease Father    Hypertension Father    Cancer Paternal Grandmother        cervical   Diabetes Paternal Grandfather    Stroke Paternal Grandfather     Diabetes Paternal Aunt    Prostate cancer Paternal Uncle    Heart disease Paternal Uncle     Social History Social History   Tobacco Use   Smoking status: Former    Types: Cigarettes    Quit date: 02/03/2012    Years since quitting: 8.9   Smokeless tobacco: Never  Vaping Use   Vaping Use: Some days  Substance Use Topics   Alcohol use: Yes    Comment: rare   Drug use: No    Review of Systems Constitutional: No fever. Eyes: No visual changes. ENT: No sore throat. Cardiovascular: + chest pain. Respiratory: + shortness of breath. Gastrointestinal: No nausea, vomiting, diarrhea. Genitourinary: Negative for dysuria. Musculoskeletal: Negative for back pain. Skin: Negative for rash. Neurological: Negative for focal weakness or numbness.  ____________________________________________   PHYSICAL EXAM:  VITAL SIGNS: ED Triage Vitals  Enc Vitals Group     BP 12/29/20 1915 140/84     Pulse Rate 12/29/20 1915 90     Resp 12/29/20 1915 20     Temp 12/29/20 1915 98.4 F (36.9 C)     Temp Source 12/29/20 1915 Oral     SpO2 12/29/20 1915 100 %     Weight 12/29/20 1916 290 lb (131.5 kg)     Height 12/29/20 1916 5\' 4"  (1.626 m)     Head Circumference --      Peak Flow --      Pain Score 12/29/20 1916 2     Pain Loc --      Pain Edu? --      Excl. in GC? --    CONSTITUTIONAL: Alert and oriented and responds appropriately to questions. Well-appearing; well-nourished HEAD: Normocephalic EYES: Conjunctivae clear, pupils appear equal, EOM appear intact ENT: normal nose; moist mucous membranes NECK: Supple, normal ROM CARD: RRR; S1 and S2 appreciated; no murmurs, no clicks, no rubs, no gallops RESP: Normal chest excursion without splinting or tachypnea; breath sounds clear and equal bilaterally; no wheezes, no rhonchi, no rales, no hypoxia or respiratory distress, speaking full sentences ABD/GI: Normal bowel sounds; non-distended; soft, non-tender, no rebound, no guarding, no  peritoneal signs, no hepatosplenomegaly BACK: The back appears normal EXT: Normal ROM in all joints; no deformity noted, no edema; no cyanosis, no calf tenderness or calf swelling SKIN: Normal color for age and race; warm; no rash on exposed skin NEURO: Moves all extremities equally PSYCH: The patient's mood and manner are appropriate.  ____________________________________________   LABS (all labs ordered are listed, but only abnormal results are displayed)  Labs Reviewed  COMPREHENSIVE METABOLIC PANEL - Abnormal; Notable for the following components:      Result Value   Glucose, Bld  100 (*)    Total Protein 6.3 (*)    ALT 65 (*)    All other components within normal limits  CBC  TROPONIN I (HIGH SENSITIVITY)  TROPONIN I (HIGH SENSITIVITY)   ____________________________________________  EKG   EKG Interpretation  Date/Time:  Friday December 29 2020 19:38:52 EDT Ventricular Rate:  92 PR Interval:  146 QRS Duration: 78 QT Interval:  352 QTC Calculation: 435 R Axis:   -23 Text Interpretation: Normal sinus rhythm Minimal voltage criteria for LVH, may be normal variant ( R in aVL ) Borderline ECG Confirmed by Rochele Raring [phone]) on 12/30/2020 2:14:05 AM        ____________________________________________  RADIOLOGY Normajean Baxter Annett Boxwell, personally viewed and evaluated these images (plain radiographs) as part of my medical decision making, as well as reviewing the written report by the radiologist.  ED MD interpretation: Chest x-ray clear.  Official radiology report(s): DG Chest 2 View  Result Date: 12/29/2020 CLINICAL DATA:  Chest pain, shortness of breath EXAM: CHEST - 2 VIEW COMPARISON:  02/22/2020 FINDINGS: Heart and mediastinal contours are within normal limits. No focal opacities or effusions. No acute bony abnormality. IMPRESSION: No active cardiopulmonary disease. Electronically Signed   By: Charlett Nose M.D.   On: 12/29/2020 19:48     ____________________________________________   PROCEDURES  Procedure(s) performed (including Critical Care):  Procedures   ____________________________________________   INITIAL IMPRESSION / ASSESSMENT AND PLAN / ED COURSE  As part of my medical decision making, I reviewed the following data within the electronic MEDICAL RECORD NUMBER Nursing notes reviewed and incorporated, Labs reviewed , EKG interpreted , Old EKG reviewed, Old chart reviewed, Radiograph reviewed , Notes from prior ED visits, and [place] Controlled Substance Database         Patient here with cough for the past 2 weeks.  Has been on steroids twice and antibiotics.  States she had some chest discomfort tonight which has improved.  Troponin x2 negative.  EKG nonischemic.  Chest x-ray clear.  No pneumothorax, pneumonia, pulmonary edema.  She is PERC negative.  Her lungs are clear to auscultation with good aeration and she has no hypoxia or increased work of breathing.  Doubt ACS, PE, dissection.  I do not feel she needs antibiotics or steroids again.  Will discharge with prescription of Tussionex to help her with her cough at night so that she can get some sleep.  She has a PCP for follow-up.  We will also refer to pulmonology.  Discussed return precautions.  Patient comfortable with this plan.  She states right now she is feeling better.   At this time, I do not feel there is any life-threatening condition present. I have reviewed, interpreted and discussed all results (EKG, imaging, lab, urine as appropriate) and exam findings with patient/family. I have reviewed nursing notes and appropriate previous records.  I feel the patient is safe to be discharged home without further emergent workup and can continue workup as an outpatient as needed. Discussed usual and customary return precautions. Patient/family verbalize understanding and are comfortable with this plan.  Outpatient follow-up has been provided as needed. All questions  have been answered.  ____________________________________________   FINAL CLINICAL IMPRESSION(S) / ED DIAGNOSES  Final diagnoses:  Cough     ED Discharge Orders          Ordered    chlorpheniramine-HYDROcodone (TUSSIONEX PENNKINETIC ER) 10-8 MG/5ML SUER  Every 12 hours PRN        12/30/20 0224            *  Please note:  Berenice Primasnna Morabito was evaluated in Emergency Department on 12/30/2020 for the symptoms described in the history of present illness. She was evaluated in the context of the global COVID-19 pandemic, which necessitated consideration that the patient might be at risk for infection with the SARS-CoV-2 virus that causes COVID-19. Institutional protocols and algorithms that pertain to the evaluation of patients at risk for COVID-19 are in a state of rapid change based on information released by regulatory bodies including the CDC and federal and state organizations. These policies and algorithms were followed during the patient's care in the ED.  Some ED evaluations and interventions may be delayed as a result of limited staffing during and the pandemic.*   Note:  This document was prepared using Dragon voice recognition software and may include unintentional dictation errors.    Chane Cowden, Layla MawKristen N, DO 12/30/20 [phone]

## 2020-12-30 NOTE — ED Notes (Signed)
Provided pt with a warm blanket 

## 2020-12-30 NOTE — Discharge Instructions (Addendum)

## 2021-01-03 ENCOUNTER — Other Ambulatory Visit: Payer: Self-pay

## 2021-01-03 ENCOUNTER — Encounter: Payer: Self-pay | Admitting: Internal Medicine

## 2021-01-03 ENCOUNTER — Ambulatory Visit (INDEPENDENT_AMBULATORY_CARE_PROVIDER_SITE_OTHER): Payer: 59 | Admitting: Internal Medicine

## 2021-01-03 VITALS — BP 130/78 | HR 87 | Temp 98.1°F | Ht 64.0 in | Wt 296.6 lb

## 2021-01-03 DIAGNOSIS — J4550 Severe persistent asthma, uncomplicated: Secondary | ICD-10-CM | POA: Diagnosis not present

## 2021-01-03 DIAGNOSIS — R0609 Other forms of dyspnea: Secondary | ICD-10-CM

## 2021-01-03 DIAGNOSIS — G4733 Obstructive sleep apnea (adult) (pediatric): Secondary | ICD-10-CM

## 2021-01-03 DIAGNOSIS — J449 Chronic obstructive pulmonary disease, unspecified: Secondary | ICD-10-CM

## 2021-01-03 DIAGNOSIS — U070 Vaping-related disorder: Secondary | ICD-10-CM | POA: Diagnosis not present

## 2021-01-03 DIAGNOSIS — F17210 Nicotine dependence, cigarettes, uncomplicated: Secondary | ICD-10-CM

## 2021-01-03 DIAGNOSIS — R062 Wheezing: Secondary | ICD-10-CM

## 2021-01-03 DIAGNOSIS — G4719 Other hypersomnia: Secondary | ICD-10-CM

## 2021-01-03 DIAGNOSIS — R0600 Dyspnea, unspecified: Secondary | ICD-10-CM

## 2021-01-03 DIAGNOSIS — Z720 Tobacco use: Secondary | ICD-10-CM

## 2021-01-03 MED ORDER — TRELEGY ELLIPTA 200-62.5-25 MCG/INH IN AEPB: 200.0000 ug | INHALATION_SPRAY | Freq: Every day | RESPIRATORY_TRACT | 0 refills | Status: DC

## 2021-01-03 MED ORDER — TRELEGY ELLIPTA 200-62.5-25 MCG/INH IN AEPB: 1.0000 | INHALATION_SPRAY | RESPIRATORY_TRACT | 10 refills | Status: AC

## 2021-01-03 MED ORDER — PREDNISONE 20 MG PO TABS: 20.0000 mg | ORAL_TABLET | Freq: Every day | ORAL | 1 refills | Status: DC

## 2021-01-03 NOTE — Patient Instructions (Signed)
Regarding ASTHMA probable COPD Start TRELEGY 200 (rinse mouth after every use) Albuterol as needed PRED 20 mg daily for 10 days  Regarding OSA Obtain Home sleep test  Allergies Continue Xyzal and singulair as prescribed   Reflux Continue PEPCID  PLEASE STOP VAPING AND SMOKING  RECOMMEND WEIGHT LOSS

## 2021-01-03 NOTE — Progress Notes (Signed)
[facility]      Date: 01/03/2021,   MRN# [id number] Jody Primasnna Sealy 02/10/1971      Admission                  Current  Jody Jensen is a 50 y.o. old female seen in consultation for wheezing at the request of Baity.     CHIEF COMPLAINT:    Wheezing and SOB with OSA  HISTORY OF PRESENT ILLNESS   50 year old pleasant white female seen today for ongoing shortness of breath wheezing dyspnea on exertion with a persistent cough over the last several months has been worsening over the last several weeks   Patient was seen in the emergency room last week  Patient had several rounds of steroids and antibiotics from her primary care doctor  Patient has had negative COVID testing multiple times   She has been diagnosed with asthma several years ago  Patient had been smoking cigarettes for most of her life but started vaping 7 years ago   Patient has tried [employer] and has not helped  Has tried Mucinex had has helped a little bit   Patient currently weighs 296 pounds which is increased from 5 years ago She has a diagnosis of sleep apnea but has not been using her mouthguard She uses invisible line which interferes with mouthguard Therefore she has untreated sleep apnea  Patient has reflux disease but she says it is controlled with Pepcid Patient has allergic rhinitis and is controlled with Xyzal and Singulair  Smoking Assessment and Cessation Counseling Upon further questioning, Patient smokes/vaping 1 ppd I have advised patient to quit/stop smoking as soon as possible due to high risk for multiple medical problems  Patient is willing to quit smoking  I have advised patient that we can assist and have options of Nicotine replacement therapy. I also advised patient on behavioral therapy and can provide oral medication therapy in conjunction with the other therapies Follow up next Office visit  for assessment of smoking cessation Smoking  cessation counseling advised for 4 minutes  At this time patient has severe persistent symptoms of asthma with reactive airways disease with a likely diagnosis of viral bronchitis       Patient is seen today for problems and issues with sleep related to excessive daytime sleepiness Patient  has been having sleep problems for many years Patient has been having excessive daytime sleepiness for a long time Patient has been having extreme fatigue and tiredness, lack of energy +  very Loud snoring every night  She has been diagnosed with sleep apnea in the past however has not been treated with CPAP uses an oral guard but has not been using it for the last several months due to the fact that she has invisible line   Discussed sleep data and reviewed with patient.  Encouraged proper weight management.  Discussed driving precautions and its relationship with hypersomnolence.  Discussed operating dangerous equipment and its relationship with hypersomnolence.  Discussed sleep hygiene, and benefits of a fixed sleep waked time.  The importance of getting eight or more hours of sleep discussed with patient.  Discussed limiting the use of the computer and television before bedtime.  Decrease naps during the day, so night time sleep will become enhanced.  Limit caffeine, and sleep deprivation.  HTN, stroke, and heart failure are potential risk factors.   Discussed risk of untreated sleep apnea including cardiac arrhthymias, stroke, DM, pulm HTN.    EPWORTH  SLEEP SCORE 9   PAST MEDICAL HISTORY   Past Medical History:  Diagnosis Date   Anxiety    Diabetes mellitus without complication (HCC)    Hyperlipidemia    Hypertension    Obesity    OSA (obstructive sleep apnea)    Ovarian cyst    Sleep apnea      SURGICAL HISTORY   Past Surgical History:  Procedure Laterality Date   ABDOMINAL HYSTERECTOMY      partial, ovaries remain, no cervix   bone spur Right    BREAST SURGERY      benign breast cyst removal   CESAREAN SECTION     CHOLECYSTECTOMY     NASAL SINUS SURGERY     ovarian cyst removal     TUBAL LIGATION       FAMILY HISTORY   Family History  Problem Relation Age of Onset   Hypertension Mother    Hyperlipidemia Mother    Heart disease Mother    Diabetes Mother    Asthma Mother    Thyroid disease Mother    Lupus Mother    Immunodeficiency Mother    Heart disease Father    Hypertension Father    Cancer Paternal Grandmother        cervical   Diabetes Paternal Grandfather    Stroke Paternal Grandfather    Diabetes Paternal Aunt    Prostate cancer Paternal Uncle    Heart disease Paternal Uncle      SOCIAL HISTORY   Social History   Tobacco Use   Smoking status: Former    Types: Cigarettes    Quit date: 02/03/2012    Years since quitting: 8.9   Smokeless tobacco: Never  Vaping Use   Vaping Use: Some days  Substance Use Topics   Alcohol use: Yes    Comment: rare   Drug use: No     MEDICATIONS    Home Medication:  Current Outpatient Rx   Order #: [id number] Class: Historical Med   Order #: [id number] Class: Historical Med   Order #: [id number] Class: Historical Med   Order #: [id number] Class: Normal   Order #: [id number] Class: Normal   Order #: [id number] Class: Normal   Order #: [id number] Class: Historical Med   Order #: [id number] Class: Historical Med   Order #: [id number] Class: Normal    Current Medication:  Current Outpatient Medications:    ADVAIR DISKUS 250-50 MCG/DOSE AEPB, Inhale 1 puff into the lungs 2 (two) times daily., Disp: , Rfl:    albuterol (ACCUNEB) 0.63 MG/3ML nebulizer solution, Take 1 ampule by nebulization every 6 (six) hours as needed for wheezing., Disp: , Rfl:    albuterol (VENTOLIN HFA) 108 (90 Base) MCG/ACT inhaler, Inhale 2 puffs into the lungs every 4 (four) hours as needed., Disp: , Rfl:    chlorpheniramine-HYDROcodone (TUSSIONEX PENNKINETIC ER) 10-8 MG/5ML SUER, Take 5 mLs by mouth every 12 (twelve) hours  as needed for cough., Disp: 70 mL, Rfl: 0   famotidine (PEPCID) 20 MG tablet, Take 1 tablet (20 mg total) by mouth at bedtime., Disp: 90 tablet, Rfl: 3   hydrochlorothiazide (HYDRODIURIL) 25 MG tablet, Take 1 tablet (25 mg total) by mouth daily., Disp: 909 tablet, Rfl: 3   levocetirizine (XYZAL) 5 MG tablet, Take 5 mg by mouth every morning., Disp: , Rfl:    montelukast (SINGULAIR) 10 MG tablet, Take 1 tablet by mouth as needed., Disp: , Rfl:    venlafaxine XR (EFFEXOR XR) 75 MG 24 hr capsule, Take 1 capsule (75 mg total) by  mouth daily with breakfast., Disp: 90 capsule, Rfl: 3    ALLERGIES   Patient has no known allergies.     REVIEW OF SYSTEMS    Review of Systems:  Gen:  Denies  fever, sweats, chills weight loss  HEENT: Denies blurred vision, double vision, ear pain, eye pain, hearing loss, nose bleeds, sore throat Cardiac:  No dizziness, chest pain or heaviness, chest tightness,edema, No JVD Resp:   +cough, -sputum production, +shortness of breath,+wheezing, -hemoptysis,  Gi: Denies swallowing difficulty, stomach pain, nausea or vomiting, diarrhea, constipation, bowel incontinence Gu:  Denies bladder incontinence, burning urine Ext:   Denies Joint pain, stiffness or swelling Skin: Denies  skin rash, easy bruising or bleeding or hives Endoc:  Denies polyuria, polydipsia , polyphagia or weight change Psych:   Denies depression, insomnia or hallucinations  Other:  All other systems negative   BP 130/78 (BP Location: Left Arm, Patient Position: Sitting, Cuff Size: Normal)   Pulse 87   Temp 98.1 F (36.7 C) (Oral)   Ht 5\' 4"  (1.626 m)   Wt 296 lb 9.6 oz (134.5 kg)   LMP 11/02/1997   SpO2 98%   BMI 50.91 kg/m     Physical Examination:   General Appearance: No distress  EYES PERRLA, EOM intact.   NECK Supple, No JVD Pulmonary: normal breath sounds, + wheezing.  CardiovascularNormal S1,S2.  No m/r/g.   Abdomen: Benign, Soft, non-tender. Skin:   warm, no rashes, no  ecchymosis  Extremities: normal, no cyanosis, clubbing. Neuro:without focal findings,  speech normal  PSYCHIATRIC: Mood, affect within normal limits.   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  Asthma/COPD At this time patient is unable to obtain pulmonary function testing or office spirometry due to her ongoing wheezing and shortness of breath I would recommend pulmonary function testing at a later time We will start Trelegy 200 rinse mouth after every use Albuterol as needed 2 to 4 puffs every 4 hours as needed Mucinex as needed Will prescribe prednisone 20 mg for 10 days  Allergies Continue Xyzal Continue Singulair  History of OSA untreated Recommend getting home sleep study for definitive diagnosis Epworth sleep score is 9 If positive patient will need to initiate CPAP therapy as soon as possible  Encouraged proper weight management.  Important to get eight or more hours of sleep  Limiting the use of the computer and television before bedtime.  Decrease naps during the day, so night time sleep will become enhanced.  Limit caffeine, and sleep deprivation.  HTN, stroke, uncontrolled diabetes and heart failure are potential risk factors.  Risk of untreated sleep apnea including cardiac arrhthymias, stroke, DM, pulm HTN.   Smoking cessation strongly advised Vaping cessation strongly advised  Obesity -recommend significant weight loss -recommend changing  diet  Deconditioned state -Recommend increased daily activity and exercise     MEDICATION ADJUSTMENTS/LABS AND TESTS ORDERED: Regarding ASTHMA probable COPD Start TRELEGY 200 (rinse mouth after every use) Albuterol as needed PRED 20 mg daily for  10 days Regarding OSA Obtain Home sleep test Allergies Continue Xyzal and singulair as prescribed Reflux Continue PEPCID PLEASE STOP VAPING AND SMOKING RECOMMEND WEIGHT LOSS   CURRENT MEDICATIONS REVIEWED AT LENGTH WITH PATIENT TODAY   Patient  satisfied with Plan of action and management. All questions answered  Follow up 3 months  Total Time Spent 55 mins   Lucie Leather, M.D.  [facility]  Medical Director [facility] Medical Director [facility] Cardio-Pulmonary Department

## 2021-02-08 ENCOUNTER — Encounter: Payer: Self-pay | Admitting: Internal Medicine

## 2021-02-14 ENCOUNTER — Ambulatory Visit: Payer: 59 | Admitting: Internal Medicine

## 2021-02-14 ENCOUNTER — Encounter: Payer: Self-pay | Admitting: Internal Medicine

## 2021-02-14 ENCOUNTER — Other Ambulatory Visit: Payer: Self-pay

## 2021-02-14 VITALS — BP 124/66 | HR 72 | Temp 97.1°F | Resp 17 | Ht 64.0 in | Wt 298.0 lb

## 2021-02-14 DIAGNOSIS — R7303 Prediabetes: Secondary | ICD-10-CM | POA: Diagnosis not present

## 2021-02-14 DIAGNOSIS — Z23 Encounter for immunization: Secondary | ICD-10-CM

## 2021-02-14 LAB — POCT GLYCOSYLATED HEMOGLOBIN (HGB A1C): Hemoglobin A1C: 5.9 % — AB (ref 4.0–5.6)

## 2021-02-14 MED ORDER — SAXENDA 18 MG/3ML ~~LOC~~ SOPN: PEN_INJECTOR | SUBCUTANEOUS | 0 refills | Status: DC

## 2021-02-14 MED ORDER — INSULIN PEN NEEDLE 31G X 5 MM MISC: 0 refills | Status: DC

## 2021-02-14 NOTE — Patient Instructions (Signed)

## 2021-02-14 NOTE — Assessment & Plan Note (Signed)
POCT A1c 5.9% Encourage low-carb, high-protein diet and exercise for weight loss We will trial Saxenda, Rx sent to pharmacy Encouraged her to eat breakfast daily to start feeling her metabolism Advised her to avoid eating so late at night  RTC in 1 month, follow-up weight check and med refill

## 2021-02-14 NOTE — Assessment & Plan Note (Signed)
Encourage low-carb, high-protein diet and exercise for weight loss We will trial Saxenda, Rx sent to pharmacy Encouraged her to eat breakfast daily to start feeling her metabolism Advised her to avoid eating so late at night  RTC in 1 month, follow-up weight check and med refill

## 2021-02-14 NOTE — Progress Notes (Signed)
Subjective:    Patient ID: Jody Jensen, female    DOB: 21-Jun-1970, 50 y.o.   MRN: [id number]  HPI  Patient presents to clinic today to discuss medications for weight loss.  Her current weight today is 298 lbs with a BMI of 51.15.  She does have a history of prediabetes, last A1c 5.8%, 07/2019. She has tried low carb, portion control and calorie counting in the past but has not been effective. She did Weight Watchers years ago which she was able to lose some weight but was not able to maintain it.  Breakfast: Not eat breakfast Lunch: Fruit or crackers, cheese Dinner: Meat, starch and veggie Snacks: ice cream rarely  Exercise: None  Review of Systems     Past Medical History:  Diagnosis Date   Anxiety    Diabetes mellitus without complication (HCC)    Hyperlipidemia    Hypertension    Obesity    OSA (obstructive sleep apnea)    Ovarian cyst    Sleep apnea     Current Outpatient Medications  Medication Sig Dispense Refill   ADVAIR DISKUS 250-50 MCG/DOSE AEPB Inhale 1 puff into the lungs 2 (two) times daily.     albuterol (ACCUNEB) 0.63 MG/3ML nebulizer solution Take 1 ampule by nebulization every 6 (six) hours as needed for wheezing.     albuterol (VENTOLIN HFA) 108 (90 Base) MCG/ACT inhaler Inhale 2 puffs into the lungs every 4 (four) hours as needed.     famotidine (PEPCID) 20 MG tablet Take 1 tablet (20 mg total) by mouth at bedtime. 90 tablet 3   Fluticasone-Umeclidin-Vilant (TRELEGY ELLIPTA) 200-62.5-25 MCG/INH AEPB Inhale 200 mcg into the lungs daily. 28 each 0   hydrochlorothiazide (HYDRODIURIL) 25 MG tablet Take 1 tablet (25 mg total) by mouth daily. 909 tablet 3   levocetirizine (XYZAL) 5 MG tablet Take 5 mg by mouth every morning.     montelukast (SINGULAIR) 10 MG tablet Take 1 tablet by mouth as needed.     predniSONE (DELTASONE) 20 MG tablet Take 1 tablet (20 mg total) by mouth daily with breakfast. 10 days 10 tablet 1   venlafaxine XR (EFFEXOR XR) 75 MG 24 hr  capsule Take 1 capsule (75 mg total) by mouth daily with breakfast. 90 capsule 3   No current facility-administered medications for this visit.    No Known Allergies  Family History  Problem Relation Age of Onset   Hypertension Mother    Hyperlipidemia Mother    Heart disease Mother    Diabetes Mother    Asthma Mother    Thyroid disease Mother    Lupus Mother    Immunodeficiency Mother    Heart disease Father    Hypertension Father    Cancer Paternal Grandmother        cervical   Diabetes Paternal Grandfather    Stroke Paternal Grandfather    Diabetes Paternal Aunt    Prostate cancer Paternal Uncle    Heart disease Paternal Uncle     Social History   Socioeconomic History   Marital status: Married    Spouse name: Not on file   Number of children: 2   Years of education: Not on file   Highest education level: Not on file  Occupational History   Not on file  Tobacco Use   Smoking status: Former    Types: Cigarettes    Quit date: 02/03/2012    Years since quitting: 9.0   Smokeless tobacco: Never  Vaping Use  Vaping Use: Some days   Devices: quit in july 2022  Substance and Sexual Activity   Alcohol use: Yes    Comment: rare   Drug use: No   Sexual activity: Yes    Birth control/protection: Surgical  Other Topics Concern   Not on file  Social History Narrative   Not on file   Social Determinants of Health   Financial Resource Strain: Not on file  Food Insecurity: Not on file  Transportation Needs: Not on file  Physical Activity: Not on file  Stress: Not on file  Social Connections: Not on file  Intimate Partner Violence: Not on file     Constitutional: Denies fever, malaise, fatigue, headache or abrupt weight changes.  Respiratory: Denies difficulty breathing, shortness of breath, cough or sputum production.   Cardiovascular: Denies chest pain, chest tightness, palpitations or swelling in the hands or feet.    No other specific complaints in a  complete review of systems (except as listed in HPI above).  Objective:   Physical Exam   BP 124/66 (BP Location: Right Arm, Patient Position: Sitting, Cuff Size: Large)   Pulse 72   Temp (!) 97.1 F (36.2 C) (Temporal)   Resp 17   Ht 5\' 4"  (1.626 m)   Wt 298 lb (135.2 kg)   LMP 11/02/1997   SpO2 100%   BMI 51.15 kg/m   Wt Readings from Last 3 Encounters:  01/03/21 296 lb 9.6 oz (134.5 kg)  12/29/20 290 lb (131.5 kg)  10/06/20 292 lb 9.6 oz (132.7 kg)    General: Appears her stated age, obese, in NAD. Cardiovascular: Normal rate and rhythm. S1,S2 noted.  No murmur, rubs or gallops noted.  Pulmonary/Chest: Normal effort and positive vesicular breath sounds. No respiratory distress. No wheezes, rales or ronchi noted.  Neurological: Alert and oriented.   BMET    Component Value Date/Time   NA 140 12/29/2020 1924   NA 141 04/20/2020 0957   K 3.7 12/29/2020 1924   CL 102 12/29/2020 1924   CO2 32 12/29/2020 1924   GLUCOSE 100 (H) 12/29/2020 1924   BUN 14 12/29/2020 1924   BUN 11 04/20/2020 0957   CREATININE 0.76 12/29/2020 1924   CREATININE 0.79 10/20/2020 0755   CALCIUM 9.1 12/29/2020 1924   GFRNONAA >60 12/29/2020 1924   GFRAA 113 04/20/2020 0957    Lipid Panel     Component Value Date/Time   CHOL 184 10/20/2020 0755   TRIG 86 10/20/2020 0755   HDL 65 10/20/2020 0755   CHOLHDL 2.8 10/20/2020 0755   VLDL 20.6 09/01/2015 0815   LDLCALC 101 (H) 10/20/2020 0755    CBC    Component Value Date/Time   WBC 8.2 12/29/2020 1924   RBC 4.51 12/29/2020 1924   HGB 13.3 12/29/2020 1924   HCT 41.4 12/29/2020 1924   PLT 192 12/29/2020 1924   MCV 91.8 12/29/2020 1924   MCH 29.5 12/29/2020 1924   MCHC 32.1 12/29/2020 1924   RDW 14.5 12/29/2020 1924   LYMPHSABS 2.7 12/02/2012 1521   MONOABS 0.5 12/02/2012 1521   EOSABS 0.2 12/02/2012 1521   BASOSABS 0.0 12/02/2012 1521    Hgb A1C Lab Results  Component Value Date   HGBA1C 5.8 (H) 07/16/2019            Assessment & Plan:     09/13/2019, NP This visit occurred during the SARS-CoV-2 public health emergency.  Safety protocols were in place, including screening questions prior to the visit, additional  usage of staff PPE, and extensive cleaning of exam room while observing appropriate contact time as indicated for disinfecting solutions.   

## 2021-02-21 ENCOUNTER — Telehealth: Payer: Self-pay | Admitting: Internal Medicine

## 2021-02-21 NOTE — Telephone Encounter (Signed)
Pt stated that the pharmacy advise that the Rx for SAXENDA 18 MG/3ML SOPN needs PA/ please advise asap

## 2021-02-26 ENCOUNTER — Telehealth: Payer: Self-pay | Admitting: Internal Medicine

## 2021-02-26 MED ORDER — AZITHROMYCIN 250 MG PO TABS: ORAL_TABLET | ORAL | 0 refills | Status: AC

## 2021-02-26 MED ORDER — PREDNISONE 20 MG PO TABS: 20.0000 mg | ORAL_TABLET | Freq: Every day | ORAL | 0 refills | Status: DC

## 2021-02-26 NOTE — Telephone Encounter (Signed)
Left message for patient

## 2021-02-26 NOTE — Telephone Encounter (Signed)
Patient is aware of recommendations and voiced her understanding. Zpak and prednisone has been sent to preferred pharmacy.  Nothing further needed.   

## 2021-02-26 NOTE — Telephone Encounter (Signed)
Spoke to patient.  Patient reports of head/chest congestion, prod cough with yellow to brown sputum, increased sob with exertion, wheezing. Sx have been present 1w. She is taking muxinex BID, albuterol HFA PRN and Trelegy once daily. Denied f/c/s or additional sx.  Fully vaccinated against covid. Not recent covid test.   Dr. Belia Heman, please advise. Thanks

## 2021-02-26 NOTE — Telephone Encounter (Signed)
Patient is scheduled to pick up HST machine on 02/28/21 she called in this morning because she has congestion in her head and chest and is coughing. She wanted to know if Dr. Belia Heman would call in some prednisone for her because it worked really well the last time.   You can call her at her work # [phone]

## 2021-02-27 ENCOUNTER — Telehealth: Payer: Self-pay

## 2021-02-27 NOTE — Telephone Encounter (Signed)
I called [employer] to follow up on the patient appeal for her Saxenda. The appeal was denied as well because the medication is not covered under the patient plan and its not a eligible for payment. I sent the patient a mychart message to notify her of the appeal determination

## 2021-02-27 NOTE — Telephone Encounter (Signed)
noted 

## 2021-02-28 ENCOUNTER — Ambulatory Visit: Payer: 59

## 2021-02-28 ENCOUNTER — Other Ambulatory Visit: Payer: Self-pay

## 2021-02-28 DIAGNOSIS — G4733 Obstructive sleep apnea (adult) (pediatric): Secondary | ICD-10-CM

## 2021-02-28 DIAGNOSIS — R062 Wheezing: Secondary | ICD-10-CM

## 2021-02-28 DIAGNOSIS — G4719 Other hypersomnia: Secondary | ICD-10-CM

## 2021-03-01 DIAGNOSIS — G4733 Obstructive sleep apnea (adult) (pediatric): Secondary | ICD-10-CM

## 2021-03-08 ENCOUNTER — Telehealth: Payer: Self-pay | Admitting: Internal Medicine

## 2021-03-08 DIAGNOSIS — G4733 Obstructive sleep apnea (adult) (pediatric): Secondary | ICD-10-CM

## 2021-03-08 NOTE — Telephone Encounter (Signed)
Patient is requesting HST results.   Dr. Kasa, please advise. Thanks 

## 2021-03-12 NOTE — Telephone Encounter (Signed)
Lm for patient.  

## 2021-03-13 NOTE — Telephone Encounter (Signed)
Spoke to patient and relayed below results.  Order placed for cpap. Recall placed.  Nothing further needed at this time.

## 2021-03-26 NOTE — Telephone Encounter (Signed)
Dr. Kasa, please advise. Thanks °

## 2021-04-20 ENCOUNTER — Telehealth: Payer: Self-pay

## 2021-04-20 NOTE — Telephone Encounter (Signed)
Copied from CRM [id number]. Topic: General - Other >> Apr 20, 2021  2:50 PM Marylen Ponto wrote: Reason for CRM: Pt reports that he father just passed away and she would like to ask Rene Kocher to send in a Rx for medication to help her nerves. Pt asked that the Rx be sent to [facility] [place], [place] - [address] Phone: [phone]   Fax: [phone]

## 2021-04-22 MED ORDER — ALPRAZOLAM 0.25 MG PO TABS: 0.2500 mg | ORAL_TABLET | Freq: Every day | ORAL | 0 refills | Status: DC | PRN

## 2021-04-22 NOTE — Telephone Encounter (Signed)
Xanax #10 sent to pharmacy.

## 2021-04-22 NOTE — Addendum Note (Signed)
Addended by: Lorre Munroe on: 04/22/2021 09:33 AM   Modules accepted: Orders

## 2021-09-04 ENCOUNTER — Telehealth: Payer: Self-pay

## 2021-09-04 NOTE — Telephone Encounter (Signed)
Called and LVM in regards to upcoming appt would like if patient could bring in sd card. Nothing further needed.  ?

## 2021-09-05 ENCOUNTER — Ambulatory Visit (INDEPENDENT_AMBULATORY_CARE_PROVIDER_SITE_OTHER): Payer: 59 | Admitting: Internal Medicine

## 2021-09-05 ENCOUNTER — Encounter: Payer: Self-pay | Admitting: Internal Medicine

## 2021-09-05 VITALS — BP 124/80 | HR 84 | Temp 97.8°F | Ht 64.0 in | Wt 261.2 lb

## 2021-09-05 DIAGNOSIS — G4733 Obstructive sleep apnea (adult) (pediatric): Secondary | ICD-10-CM

## 2021-09-05 NOTE — Patient Instructions (Addendum)
Will need Re-testing with Home sleep Test to assess AHI ?Continue weight loss program ? ?Great Work!! Keep it up!! ?

## 2021-09-05 NOTE — Addendum Note (Signed)
Addended by: Lajoyce Lauber A on: 09/05/2021 04:48 PM ? ? Modules accepted: Orders ? ?

## 2021-09-05 NOTE — Progress Notes (Signed)
?[facility] Consultation   ? ? ? ?Date: 09/05/2021,   ?MRN# [id number] Jody Jensen 05/08/1971 ? ? ?   ?AdmissionWeight: 261 lb 3.2 oz (118.5 kg)                 ?CurrentWeight: 261 lb 3.2 oz (118.5 kg) ?Jody Jensen is a 51 y.o. old female seen in consultation for wheezing at the request of Baity. ? ? ? ? ?CHIEF COMPLAINT:  ? ?Follow up OSA ?Follow up wheezing ? ? ?HISTORY OF PRESENT ILLNESS  ? ?Patient diagnosed with severe OSA AHI 77 ?Patient started oral device 2 months ago ?Patient lost 30 pounds ?Less fatigue less sluggish ?More energy since using oral device for severe OSA ? ?Patient stopped smoking and vaping  ?No longer using inhalers ? ?Current weight 261 ?Weighed 296 in 01/2021 ? ?No exacerbation at this time ?No evidence of heart failure at this time ?No evidence or signs of infection at this time ?No respiratory distress ?No fevers, chills, nausea, vomiting, diarrhea ?No evidence of lower extremity edema ?No evidence hemoptysis ? ? ? ? ?PAST MEDICAL HISTORY  ? ?Past Medical History:  ?Diagnosis Date  ? Anxiety   ? Diabetes mellitus without complication (HCC)   ? Hyperlipidemia   ? Hypertension   ? Obesity   ? OSA (obstructive sleep apnea)   ? Ovarian cyst   ? Sleep apnea   ? ? ? ?SURGICAL HISTORY  ? ?Past Surgical History:  ?Procedure Laterality Date  ? ABDOMINAL HYSTERECTOMY    ?  partial, ovaries remain, no cervix  ? bone spur Right   ? BREAST SURGERY    ? benign breast cyst removal  ? CESAREAN SECTION    ? CHOLECYSTECTOMY    ? NASAL SINUS SURGERY    ? ovarian cyst removal    ? TUBAL LIGATION    ? ? ? ?FAMILY HISTORY  ? ?Family History  ?Problem Relation Age of Onset  ? Hypertension Mother   ? Hyperlipidemia Mother   ? Heart disease Mother   ? Diabetes Mother   ? Asthma Mother   ? Thyroid disease Mother   ? Lupus Mother   ? Immunodeficiency Mother   ? Heart disease Father   ? Hypertension Father   ? Cancer Paternal Grandmother   ?     cervical  ? Diabetes Paternal Grandfather   ?  Stroke Paternal Grandfather   ? Diabetes Paternal Aunt   ? Prostate cancer Paternal Uncle   ? Heart disease Paternal Uncle   ? ? ? ?SOCIAL HISTORY  ? ?Social History  ? ?Tobacco Use  ? Smoking status: Former  ?  Packs/day: 2.00  ?  Years: 12.00  ?  Pack years: 24.00  ?  Types: Cigarettes  ?  Quit date: 02/03/2012  ?  Years since quitting: 9.5  ? Smokeless tobacco: Never  ?Vaping Use  ? Vaping Use: Former  ? Devices: quit in july 2022  ?Substance Use Topics  ? Alcohol use: Yes  ?  Comment: rare  ? Drug use: No  ? ? ? ?MEDICATIONS  ? ? ?Home Medication:  ?Current Outpatient Rx  ? Order #: [id number] Class: Historical Med  ? Order #: [id number] Class: Historical Med  ? Order #: [id number] Class: Normal  ? Order #: [id number] Class: Sample  ? Order #: [id number] Class: Normal  ? Order #: [id number] Class: Historical Med  ? Order #: [id number] Class: Historical Med  ? Order #: [id number] Class: Normal  ? Order #: [id number] Class: Normal  ?  ?  Current Medication: ? ?Current Outpatient Medications:  ?  albuterol (ACCUNEB) 0.63 MG/3ML nebulizer solution, Take 1 ampule by nebulization every 6 (six) hours as needed for wheezing., Disp: , Rfl:  ?  albuterol (VENTOLIN HFA) 108 (90 Base) MCG/ACT inhaler, Inhale 2 puffs into the lungs every 4 (four) hours as needed., Disp: , Rfl:  ?  famotidine (PEPCID) 20 MG tablet, Take 1 tablet (20 mg total) by mouth at bedtime., Disp: 90 tablet, Rfl: 3 ?  Fluticasone-Umeclidin-Vilant (TRELEGY ELLIPTA) 200-62.5-25 MCG/INH AEPB, Inhale 200 mcg into the lungs daily., Disp: 28 each, Rfl: 0 ?  hydrochlorothiazide (HYDRODIURIL) 25 MG tablet, Take 1 tablet (25 mg total) by mouth daily., Disp: 909 tablet, Rfl: 3 ?  levocetirizine (XYZAL) 5 MG tablet, Take 5 mg by mouth every morning., Disp: , Rfl:  ?  montelukast (SINGULAIR) 10 MG tablet, Take 1 tablet by mouth as needed., Disp: , Rfl:  ?  venlafaxine XR (EFFEXOR XR) 75 MG 24 hr capsule, Take 1 capsule (75 mg total) by mouth daily with breakfast., Disp: 90 capsule, Rfl:  3 ?  ALPRAZolam (XANAX) 0.25 MG tablet, Take 1 tablet (0.25 mg total) by mouth daily as needed for anxiety. (Patient not taking: Reported on 09/05/2021), Disp: 10 tablet, Rfl: 0 ? ? ? ?ALLERGIES  ? ?Patient has no known allergies. ? ? ? ? ?REVIEW OF SYSTEMS  ? ? ?Review of Systems: ? ?Gen:  Denies  fever, sweats, chills weight loss  ?HEENT: Denies blurred vision, double vision, ear pain, eye pain, hearing loss, nose bleeds, sore throat ?Cardiac:  No dizziness, chest pain or heaviness, chest tightness,edema, No JVD ?Resp:   No cough, -sputum production, -shortness of breath,-wheezing, -hemoptysis,  ?Other:  All other systems negative ? ? ?BP 124/80 (BP Location: Left Arm, Cuff Size: Large)   Pulse 84   Temp 97.8 ?F (36.6 ?C) (Temporal)   Ht 5\' 4"  (1.626 m)   Wt 261 lb 3.2 oz (118.5 kg)   LMP 11/02/1997   SpO2 97%   BMI 44.83 kg/m?  ? ? ? ?Physical Examination:  ? ?General Appearance: No distress  ?EYES PERRLA, EOM intact.   ?NECK Supple, No JVD ?Pulmonary: normal breath sounds, No wheezing.  ?CardiovascularNormal S1,S2.  No m/r/g.   ?ALL OTHER ROS ARE NEGATIVE ? ? ?  ? ?IMAGING  ? ? ?Chest x-ray 12/29/2020 ?Personally reviewed by me today ?No acute pneumonia or effusions at this time ? ?ASSESSMENT/PLAN  ? ?51 year old obese white female seen today for follow-up severe sleep apnea with severe reactive airways disease and asthma  ?Has diagnosis of severe OSA with AHI of 77 ? ?Patient had lost 30 pounds and started using oral device patient also stopped smoking and vaping ? ?At this time her asthma has resolved ?Well-controlled with avoidance of smoking and vaping ?No longer using inhalers ?Recommend avoiding allergens ? ?Severe sleep apnea ?Continue oral device ?We will need to retest with home sleep study to assess interval changes in AHI ? ?Weight loss ?Patient lost approximately 30 pounds since last office visit ? ? ?Allergic rhinitis has resolved ? ? ?Encouraged proper weight management.  ?Important to get  eight or more hours of sleep  ?Limiting the use of the computer and television before bedtime.  ?Decrease naps during the day, so night time sleep will become enhanced.  ?Limit caffeine, and sleep deprivation.  ?HTN, stroke, uncontrolled diabetes and heart failure are potential risk factors.  ?Risk of untreated sleep apnea including cardiac arrhthymias, stroke, DM, pulm HTN.  ? ?  MEDICATION ADJUSTMENTS/LABS AND TESTS ORDERED: ?Home sleep study ordered to assess for interval changes and AHI ? ? ?CURRENT MEDICATIONS REVIEWED AT LENGTH WITH PATIENT TODAY ? ? ?Patient  satisfied with Plan of action and management. All questions answered ? ?Follow up 6 months ?Total Time Spent 17 mins ? ? ?Jody Jensen, M.D.  ?[facility]  ?Medical Director [facility] ?Medical Director [facility]  ? ? ? ? ? ? ? ?

## 2021-09-17 ENCOUNTER — Other Ambulatory Visit: Payer: Self-pay | Admitting: Internal Medicine

## 2021-09-18 MED ORDER — ALBUTEROL SULFATE HFA 108 (90 BASE) MCG/ACT IN AERS: 1.0000 | INHALATION_SPRAY | Freq: Four times a day (QID) | RESPIRATORY_TRACT | 0 refills | Status: DC | PRN

## 2021-09-18 MED ORDER — MONTELUKAST SODIUM 10 MG PO TABS: 10.0000 mg | ORAL_TABLET | ORAL | 0 refills | Status: DC | PRN

## 2021-09-18 MED ORDER — LEVOCETIRIZINE DIHYDROCHLORIDE 5 MG PO TABS: 5.0000 mg | ORAL_TABLET | Freq: Every morning | ORAL | 0 refills | Status: DC

## 2021-10-25 ENCOUNTER — Telehealth: Payer: Self-pay

## 2021-10-25 NOTE — Telephone Encounter (Signed)
I could do that in 1 20 min appt. Have her schedule appt.

## 2021-10-25 NOTE — Telephone Encounter (Signed)
It really depends on the number of skin tags she has an location.  If she has numerous skin tags she may want to consider seeing a dermatologist for this.

## 2021-10-25 NOTE — Telephone Encounter (Signed)
Copied from CRM [id number]. Topic: General - Other >> Oct 24, 2021  2:02 PM Marylen Ponto wrote: Reason for CRM: Pt would like to have skintags removed and requests call to advise if this could be done during an office visit. Pt requests call back asap. Cb# [phone]

## 2021-10-25 NOTE — Telephone Encounter (Signed)
Pt called back, states she has 2 skin tags under one arm and 1 under the other arm. Please call back for further assistance.

## 2021-10-25 NOTE — Telephone Encounter (Signed)
LMTCB 10/25/2021.  PEC Please advise pt when she calls back.    Thanks,   -Vernona Rieger

## 2021-10-30 NOTE — Telephone Encounter (Signed)
Tried calling; no answer.  PEC please advise pt when she calls back.   Thanks,   -Vernona Rieger

## 2021-11-01 ENCOUNTER — Other Ambulatory Visit: Payer: Self-pay | Admitting: Internal Medicine

## 2021-11-01 NOTE — Telephone Encounter (Signed)
Medication Refill - Medication:  venlafaxine XR (EFFEXOR XR) 75 MG 24 hr capsule   Has the patient contacted their pharmacy? Yes.   Contact PCP  Preferred Pharmacy (with phone number or street name):  [facility] [address], [place] - [address]  [address] [place] [address]  Phone:  [phone]  Fax:  [phone]   Has the patient been seen for an appointment in the last year OR does the patient have an upcoming appointment? Yes.    Agent: Please be advised that RX refills may take up to 3 business days. We ask that you follow-up with your pharmacy.

## 2021-11-02 ENCOUNTER — Ambulatory Visit: Payer: 59 | Admitting: Internal Medicine

## 2021-11-02 ENCOUNTER — Encounter: Payer: Self-pay | Admitting: Internal Medicine

## 2021-11-02 VITALS — BP 120/82 | HR 81 | Ht 63.0 in | Wt 258.2 lb

## 2021-11-02 DIAGNOSIS — L918 Other hypertrophic disorders of the skin: Secondary | ICD-10-CM

## 2021-11-02 MED ORDER — VENLAFAXINE HCL ER 75 MG PO CP24: 75.0000 mg | ORAL_CAPSULE | Freq: Every day | ORAL | 1 refills | Status: DC

## 2021-11-02 NOTE — Progress Notes (Signed)
Subjective:    Patient ID: Jody Jensen, female    DOB: Feb 20, 1971, 51 y.o.   MRN: [id number]  HPI  Pt presents to the clinic today with c/o skin tags. She has 2 under her left axilla and 4 under her right axilla that she would like removed today.  Review of Systems  Past Medical History:  Diagnosis Date   Anxiety    Diabetes mellitus without complication (HCC)    Hyperlipidemia    Hypertension    Obesity    OSA (obstructive sleep apnea)    Ovarian cyst    Sleep apnea     Current Outpatient Medications  Medication Sig Dispense Refill   albuterol (ACCUNEB) 0.63 MG/3ML nebulizer solution Take 1 ampule by nebulization every 6 (six) hours as needed for wheezing.     albuterol (VENTOLIN HFA) 108 (90 Base) MCG/ACT inhaler Inhale 1-2 puffs into the lungs every 6 (six) hours as needed. 8 g 0   ALPRAZolam (XANAX) 0.25 MG tablet Take 1 tablet (0.25 mg total) by mouth daily as needed for anxiety. (Patient not taking: Reported on 09/05/2021) 10 tablet 0   famotidine (PEPCID) 20 MG tablet Take 1 tablet (20 mg total) by mouth at bedtime. 90 tablet 3   Fluticasone-Umeclidin-Vilant (TRELEGY ELLIPTA) 200-62.5-25 MCG/INH AEPB Inhale 200 mcg into the lungs daily. 28 each 0   hydrochlorothiazide (HYDRODIURIL) 25 MG tablet Take 1 tablet (25 mg total) by mouth daily. 909 tablet 3   levocetirizine (XYZAL) 5 MG tablet Take 1 tablet (5 mg total) by mouth every morning. 30 tablet 0   montelukast (SINGULAIR) 10 MG tablet Take 1 tablet (10 mg total) by mouth as needed. 30 tablet 0   venlafaxine XR (EFFEXOR XR) 75 MG 24 hr capsule Take 1 capsule (75 mg total) by mouth daily with breakfast. 90 capsule 1   No current facility-administered medications for this visit.    No Known Allergies  Family History  Problem Relation Age of Onset   Hypertension Mother    Hyperlipidemia Mother    Heart disease Mother    Diabetes Mother    Asthma Mother    Thyroid disease Mother    Lupus Mother     Immunodeficiency Mother    Heart disease Father    Hypertension Father    Cancer Paternal Grandmother        cervical   Diabetes Paternal Grandfather    Stroke Paternal Grandfather    Diabetes Paternal Aunt    Prostate cancer Paternal Uncle    Heart disease Paternal Uncle     Social History   Socioeconomic History   Marital status: Married    Spouse name: Not on file   Number of children: 2   Years of education: Not on file   Highest education level: Not on file  Occupational History   Not on file  Tobacco Use   Smoking status: Former    Packs/day: 2.00    Years: 12.00    Pack years: 24.00    Types: Cigarettes    Quit date: 02/03/2012    Years since quitting: 9.7   Smokeless tobacco: Never  Vaping Use   Vaping Use: Former   Devices: quit in july 2022  Substance and Sexual Activity   Alcohol use: Yes    Comment: rare   Drug use: No   Sexual activity: Yes    Birth control/protection: Surgical  Other Topics Concern   Not on file  Social History Narrative   Not on  file   Social Determinants of Health   Financial Resource Strain: Not on file  Food Insecurity: Not on file  Transportation Needs: Not on file  Physical Activity: Not on file  Stress: Not on file  Social Connections: Not on file  Intimate Partner Violence: Not on file     Constitutional: Denies fever, malaise, fatigue, headache or abrupt weight changes.  Respiratory: Denies difficulty breathing, shortness of breath, cough or sputum production.   Cardiovascular: Denies chest pain, chest tightness, palpitations or swelling in the hands or feet.  Denies decrease in range of motion, difficulty with gait, muscle pain or joint pain and swelling.  Skin: Pt reports skin tags. Denies redness, rashes, or ulcercations.   No other specific complaints in a complete review of systems (except as listed in HPI above).     Objective:   Physical Exam  BP 120/82   Pulse 81   Ht 5\' 3"  (1.6 m)   Wt 258 lb 3.2 oz  (117.1 kg)   LMP 11/02/1997   SpO2 98%   BMI 45.74 kg/m   Wt Readings from Last 3 Encounters:  09/05/21 261 lb 3.2 oz (118.5 kg)  02/14/21 298 lb (135.2 kg)  01/03/21 296 lb 9.6 oz (134.5 kg)    General: Appears  her stated age, well obese, in NAD. Skin: Warm, dry and intact.  4 skin tags noted in right axilla, 2 skin tag noted in left axilla. Cardiovascular: Normal rate. Pulmonary/Chest: Normal effort. Neurological: Alert and oriented.   BMET    Component Value Date/Time   NA 140 12/29/2020 1924   NA 141 04/20/2020 0957   K 3.7 12/29/2020 1924   CL 102 12/29/2020 1924   CO2 32 12/29/2020 1924   GLUCOSE 100 (H) 12/29/2020 1924   BUN 14 12/29/2020 1924   BUN 11 04/20/2020 0957   CREATININE 0.76 12/29/2020 1924   CREATININE 0.79 10/20/2020 0755   CALCIUM 9.1 12/29/2020 1924   GFRNONAA >60 12/29/2020 1924   GFRAA 113 04/20/2020 0957    Lipid Panel     Component Value Date/Time   CHOL 184 10/20/2020 0755   TRIG 86 10/20/2020 0755   HDL 65 10/20/2020 0755   CHOLHDL 2.8 10/20/2020 0755   VLDL 20.6 09/01/2015 0815   LDLCALC 101 (H) 10/20/2020 0755    CBC    Component Value Date/Time   WBC 8.2 12/29/2020 1924   RBC 4.51 12/29/2020 1924   HGB 13.3 12/29/2020 1924   HCT 41.4 12/29/2020 1924   PLT 192 12/29/2020 1924   MCV 91.8 12/29/2020 1924   MCH 29.5 12/29/2020 1924   MCHC 32.1 12/29/2020 1924   RDW 14.5 12/29/2020 1924   LYMPHSABS 2.7 12/02/2012 1521   MONOABS 0.5 12/02/2012 1521   EOSABS 0.2 12/02/2012 1521   BASOSABS 0.0 12/02/2012 1521    Hgb A1C Lab Results  Component Value Date   HGBA1C 5.9 (A) 02/14/2021           Assessment & Plan:   Skin Tags:  6 skin tags removed by this provider, see procedure note  Procedure Note:  Discussed risk of skin tag removal including pain, bleeding, infection, and incomplete removal. Informed consent obtained verbally Skin tags cleansed with Betadine x 3 Area numbned with 1% Lidocaine with Epi Skin  tags excised with #11 blade Bleeding controlled with pressure Band aid's applied Pt tolerated procedure well- no complications After care instructions provided  Schedule an appt for your annual exam 02/16/2021, NP

## 2021-11-02 NOTE — Patient Instructions (Signed)
Skin Tag, Adult  A skin tag (acrochordon) is a soft, extra growth of skin. Most skin tags are skin-colored and rarely bigger than a pencil eraser. They commonly form in areas where there is frequent rubbing, or friction, on the skin. This may be where there are folds in the skin, such as the eyelids, neck, armpit, or groin. Skin tags are not dangerous, and they do not spread from person to person (are not contagious). You may have one skin tag or several. Skin tags do not require treatment. However, your health care provider may recommend removal of a skin tag if it: Gets irritated from clothing or jewelry. Bleeds. Is visible and unsightly. What are the causes? This condition is linked with: Increasing age. Pregnancy. Diabetes. Obesity. What are the signs or symptoms? Skin tags usually do not cause symptoms unless they get irritated by items touching your skin, such as clothing or jewelry. When this happens, you may have pain, itching, or bleeding. How is this diagnosed? This condition is diagnosed with an evaluation from your health care provider. No testing is needed for diagnosis. How is this treated? Treatment for this condition depends on whether you have symptoms. If a skin tag needs to be removed, your health care provider can remove it with: A simple surgical procedure using scissors. A procedure that involves freezing your skin tag with a gas in liquid form (liquid nitrogen). A procedure that uses heat to destroy your skin tag (electrodessication). Your health care provider may also remove your skin tag if it is visible or unsightly, Follow these instructions at home: Watch for any changes in your skin tag. A normal skin tag does not require any other special care at home. Take over-the-counter and prescription medicines only as told by your health care provider. Keep all follow-up visits as told by your health care provider. This is important. Contact a health care provider  if: You have a skin tag that: Becomes painful. Changes color. Bleeds. Swells. Summary Skin tags are soft, extra growths of skin found in areas of frequent rubbing or friction. Skin tags usually do not cause symptoms. If symptoms occur, you may have pain, itching, or bleeding. If your skin tag causes symptoms or is unsightly, your health care provider can remove it. This information is not intended to replace advice given to you by your health care provider. Make sure you discuss any questions you have with your health care provider. Document Revised: 03/22/2019 Document Reviewed: 03/22/2019 Elsevier Patient Education  2022 Elsevier Inc.  

## 2021-11-02 NOTE — Telephone Encounter (Signed)
Requested medication (s) are due for refill today: expired medication  Requested medication (s) are on the active medication list: yes  Last refill:  10/06/20 #90 3 refills  Future visit scheduled: today   Notes to clinic:  expired medication . Do you want to renew Rx?     Requested Prescriptions  Pending Prescriptions Disp Refills   venlafaxine XR (EFFEXOR XR) 75 MG 24 hr capsule 90 capsule 3    Sig: Take 1 capsule (75 mg total) by mouth daily with breakfast.     Psychiatry: Antidepressants - SNRI - desvenlafaxine & venlafaxine Failed - 11/02/2021 11:12 AM      Failed - Valid encounter within last 6 months    Recent Outpatient Visits           8 months ago Need for immunization against influenza   [facility] [place], Salvadore Oxford, NP   1 year ago Encounter for general adult medical examination with abnormal findings   [facility], Salvadore Oxford, NP       Future Appointments             Today Sampson Si, Salvadore Oxford, NP [facility]   In 1 week [place], Salvadore Oxford, NP [facility]             Failed - Lipid Panel in normal range within the last 12 months    Cholesterol  Date Value Ref Range Status  10/20/2020 184 <200 mg/dL Final   LDL Cholesterol (Calc)  Date Value Ref Range Status  10/20/2020 101 (H) mg/dL (calc) Final    Comment:    Reference range: <100 . Desirable range <100 mg/dL for primary prevention;   <70 mg/dL for patients with CHD or diabetic patients  with > or = 2 CHD risk factors. Marland Kitchen LDL-C is now calculated using the Martin-Hopkins  calculation, which is a validated novel method providing  better accuracy than the Friedewald equation in the  estimation of LDL-C.  Horald Pollen et al. Lenox Ahr. 6811;572(62): 2061-2068  (http://education.[employer].com/faq/FAQ164)    HDL  Date Value Ref Range Status  10/20/2020 65 > OR = 50 mg/dL Final   Triglycerides  Date Value Ref Range Status   10/20/2020 86 <150 mg/dL Final         Passed - Cr in normal range and within 360 days    Creat  Date Value Ref Range Status  10/20/2020 0.79 0.50 - 1.10 mg/dL Final   Creatinine, Ser  Date Value Ref Range Status  12/29/2020 0.76 0.44 - 1.00 mg/dL Final   Creatinine, Urine  Date Value Ref Range Status  02/02/2013 88.7 mg/dL Final         Passed - Last BP in normal range    BP Readings from Last 1 Encounters:  09/05/21 124/80

## 2021-11-09 ENCOUNTER — Ambulatory Visit (INDEPENDENT_AMBULATORY_CARE_PROVIDER_SITE_OTHER): Payer: 59 | Admitting: Internal Medicine

## 2021-11-09 ENCOUNTER — Encounter: Payer: Self-pay | Admitting: Internal Medicine

## 2021-11-09 VITALS — BP 122/82 | HR 83 | Temp 97.1°F | Ht 63.5 in | Wt 257.0 lb

## 2021-11-09 DIAGNOSIS — Z1211 Encounter for screening for malignant neoplasm of colon: Secondary | ICD-10-CM | POA: Diagnosis not present

## 2021-11-09 DIAGNOSIS — Z1231 Encounter for screening mammogram for malignant neoplasm of breast: Secondary | ICD-10-CM

## 2021-11-09 DIAGNOSIS — Z0001 Encounter for general adult medical examination with abnormal findings: Secondary | ICD-10-CM | POA: Diagnosis not present

## 2021-11-09 DIAGNOSIS — E010 Iodine-deficiency related diffuse (endemic) goiter: Secondary | ICD-10-CM

## 2021-11-09 DIAGNOSIS — F419 Anxiety disorder, unspecified: Secondary | ICD-10-CM

## 2021-11-09 MED ORDER — MONTELUKAST SODIUM 10 MG PO TABS: 10.0000 mg | ORAL_TABLET | ORAL | 1 refills | Status: DC | PRN

## 2021-11-09 MED ORDER — LEVOCETIRIZINE DIHYDROCHLORIDE 5 MG PO TABS: 5.0000 mg | ORAL_TABLET | Freq: Every morning | ORAL | 1 refills | Status: DC

## 2021-11-09 MED ORDER — VENLAFAXINE HCL ER 150 MG PO CP24: 150.0000 mg | ORAL_CAPSULE | Freq: Every day | ORAL | 1 refills | Status: DC

## 2021-11-09 NOTE — Progress Notes (Signed)
Subjective:    Patient ID: Jody Jensen, female    DOB: [birth year], 51 y.o.   MRN: [id number]  HPI  Pt presents to the clinic today for her annual exam.  Flu: 02/2021 Tetanus: 07/2019 Covid: Pfizer x 2 Shingrix: never Pap smear: hysterectomy Mammogram: 08/2019 Colon screening: never Vision screening: annually Dentist: biannually  Diet: She does eat meat. She consumes fruits and veggies. She tries to avoid fried foods. She drinks mostly water. Exercise: None  Review of Systems     Past Medical History:  Diagnosis Date   Anxiety    Diabetes mellitus without complication (HCC)    Hyperlipidemia    Hypertension    Obesity    OSA (obstructive sleep apnea)    Ovarian cyst    Sleep apnea     Current Outpatient Medications  Medication Sig Dispense Refill   albuterol (ACCUNEB) 0.63 MG/3ML nebulizer solution Take 1 ampule by nebulization every 6 (six) hours as needed for wheezing.     albuterol (VENTOLIN HFA) 108 (90 Base) MCG/ACT inhaler Inhale 1-2 puffs into the lungs every 6 (six) hours as needed. 8 g 0   famotidine (PEPCID) 20 MG tablet Take 1 tablet (20 mg total) by mouth at bedtime. 90 tablet 3   Fluticasone-Umeclidin-Vilant (TRELEGY ELLIPTA) 200-62.5-25 MCG/INH AEPB Inhale 200 mcg into the lungs daily. 28 each 0   hydrochlorothiazide (HYDRODIURIL) 25 MG tablet Take 1 tablet (25 mg total) by mouth daily. 909 tablet 3   levocetirizine (XYZAL) 5 MG tablet Take 1 tablet (5 mg total) by mouth every morning. 30 tablet 0   montelukast (SINGULAIR) 10 MG tablet Take 1 tablet (10 mg total) by mouth as needed. 30 tablet 0   venlafaxine XR (EFFEXOR XR) 75 MG 24 hr capsule Take 1 capsule (75 mg total) by mouth daily with breakfast. 90 capsule 1   No current facility-administered medications for this visit.    No Known Allergies  Family History  Problem Relation Age of Onset   Hypertension Mother    Hyperlipidemia Mother    Heart disease Mother    Diabetes Mother    Asthma  Mother    Thyroid disease Mother    Lupus Mother    Immunodeficiency Mother    Heart disease Father    Hypertension Father    Cancer Paternal Grandmother        cervical   Diabetes Paternal Grandfather    Stroke Paternal Grandfather    Diabetes Paternal Aunt    Prostate cancer Paternal Uncle    Heart disease Paternal Uncle     Social History   Socioeconomic History   Marital status: Married    Spouse name: Not on file   Number of children: 2   Years of education: Not on file   Highest education level: Not on file  Occupational History   Not on file  Tobacco Use   Smoking status: Former    Packs/day: 2.00    Years: 12.00    Total pack years: 24.00    Types: Cigarettes    Quit date: 02/03/2012    Years since quitting: 9.7   Smokeless tobacco: Never  Vaping Use   Vaping Use: Former   Devices: quit in july 2022  Substance and Sexual Activity   Alcohol use: Yes    Comment: rare   Drug use: No   Sexual activity: Yes    Birth control/protection: Surgical  Other Topics Concern   Not on file  Social History Narrative  Not on file   Social Determinants of Health   Financial Resource Strain: Not on file  Food Insecurity: Not on file  Transportation Needs: Not on file  Physical Activity: Not on file  Stress: Not on file  Social Connections: Not on file  Intimate Partner Violence: Not on file     Constitutional: Denies fever, malaise, fatigue, headache or abrupt weight changes.  HEENT: Denies eye pain, eye redness, ear pain, ringing in the ears, wax buildup, runny nose, nasal congestion, bloody nose, or sore throat. Respiratory: Denies difficulty breathing, shortness of breath, cough or sputum production.   Cardiovascular: Denies chest pain, chest tightness, palpitations or swelling in the hands or feet.  Gastrointestinal: Pt reports intermittent constipation. Denies abdominal pain, bloating, constipation, diarrhea or blood in the stool.  GU: Denies urgency,  frequency, pain with urination, burning sensation, blood in urine, odor or discharge. Musculoskeletal: Pt reports joint pain. Denies decrease in range of motion, difficulty with gait, muscle pain or joint swelling.  Skin: Denies redness, rashes, lesions or ulcercations.  Neurological: Denies dizziness, difficulty with memory, difficulty with speech or problems with balance and coordination.  Psych: Pt has a history of anxiety, reports increased irritability. Denies depression, SI/HI.  No other specific complaints in a complete review of systems (except as listed in HPI above).  Objective:   Physical Exam BP 122/82 (BP Location: Right Arm, Patient Position: Sitting, Cuff Size: Large)   Pulse 83   Temp (!) 97.1 F (36.2 C) (Temporal)   Ht 5' 3.5" (1.613 m)   Wt 257 lb (116.6 kg)   LMP 11/02/1997   SpO2 97%   BMI 44.81 kg/m   Wt Readings from Last 3 Encounters:  11/02/21 258 lb 3.2 oz (117.1 kg)  09/05/21 261 lb 3.2 oz (118.5 kg)  02/14/21 298 lb (135.2 kg)    General: Appears her stated age,obese, in NAD. Skin: Warm, dry and intact.  HEENT: Head: normal shape and size; Eyes: sclera white, no icterus, conjunctiva pink, PERRLA and EOMs intact; Neck:  Neck supple, trachea midline. No masses, lumps present. Thyromegaly noted. Cardiovascular: Normal rate and rhythm. S1,S2 noted.  No murmur, rubs or gallops noted. No JVD or BLE edema. No carotid bruits noted. Pulmonary/Chest: Normal effort and positive vesicular breath sounds. No respiratory distress. No wheezes, rales or ronchi noted.  Abdomen: Soft and nontender. Normal bowel sounds.  Musculoskeletal: Strength 5/5 BUE/BLE. No difficulty with gait.  Neurological: Alert and oriented. Cranial nerves II-XII grossly intact. Coordination normal.  Psychiatric: Mood and affect normal. Behavior is normal. Judgment and thought content normal.    BMET    Component Value Date/Time   NA 140 12/29/2020 1924   NA 141 04/20/2020 0957   K 3.7  12/29/2020 1924   CL 102 12/29/2020 1924   CO2 32 12/29/2020 1924   GLUCOSE 100 (H) 12/29/2020 1924   BUN 14 12/29/2020 1924   BUN 11 04/20/2020 0957   CREATININE 0.76 12/29/2020 1924   CREATININE 0.79 10/20/2020 0755   CALCIUM 9.1 12/29/2020 1924   GFRNONAA >60 12/29/2020 1924   GFRAA 113 04/20/2020 0957    Lipid Panel     Component Value Date/Time   CHOL 184 10/20/2020 0755   TRIG 86 10/20/2020 0755   HDL 65 10/20/2020 0755   CHOLHDL 2.8 10/20/2020 0755   VLDL 20.6 09/01/2015 0815   LDLCALC 101 (H) 10/20/2020 0755    CBC    Component Value Date/Time   WBC 8.2 12/29/2020 1924   RBC  4.51 12/29/2020 1924   HGB 13.3 12/29/2020 1924   HCT 41.4 12/29/2020 1924   PLT 192 12/29/2020 1924   MCV 91.8 12/29/2020 1924   MCH 29.5 12/29/2020 1924   MCHC 32.1 12/29/2020 1924   RDW 14.5 12/29/2020 1924   LYMPHSABS 2.7 12/02/2012 1521   MONOABS 0.5 12/02/2012 1521   EOSABS 0.2 12/02/2012 1521   BASOSABS 0.0 12/02/2012 1521    Hgb A1C Lab Results  Component Value Date   HGBA1C 5.9 (A) 02/14/2021            Assessment & Plan:   Preventative Health Maintenance:  Encouraged her to get a flu shot in the fall Tetanus UTD Encouraged her to get he covid booster Discussed Shingrix vaccine, she will check coverage with her insurance company and schedule a nurse visit  She no longer needs pap smears Mammogram ordered, she will call to schedule Referral to GI for screening colonoscopy Encouraged her to consume a balanced diet and exercise regimen Advised her to see an eye doctor and dentist annually Will check CBC, CMET, Lipid, A1C today  Thyromegaly:  TSH today  RTC in 6 months follow up chronic conditions Webb Silversmith, NP

## 2021-11-09 NOTE — Assessment & Plan Note (Signed)
Deteriorated Will increase Venlafaxine to 150 mg daily Support offered

## 2021-11-09 NOTE — Patient Instructions (Signed)
Health Maintenance for Postmenopausal Women Menopause is a normal process in which your ability to get pregnant comes to an end. This process happens slowly over many months or years, usually between the ages of 48 and 55. Menopause is complete when you have missed your menstrual period for 12 months. It is important to talk with your health care provider about some of the most common conditions that affect women after menopause (postmenopausal women). These include heart disease, cancer, and bone loss (osteoporosis). Adopting a healthy lifestyle and getting preventive care can help to promote your health and wellness. The actions you take can also lower your chances of developing some of these common conditions. What are the signs and symptoms of menopause? During menopause, you may have the following symptoms: Hot flashes. These can be moderate or severe. Night sweats. Decrease in sex drive. Mood swings. Headaches. Tiredness (fatigue). Irritability. Memory problems. Problems falling asleep or staying asleep. Talk with your health care provider about treatment options for your symptoms. Do I need hormone replacement therapy? Hormone replacement therapy is effective in treating symptoms that are caused by menopause, such as hot flashes and night sweats. Hormone replacement carries certain risks, especially as you become older. If you are thinking about using estrogen or estrogen with progestin, discuss the benefits and risks with your health care provider. How can I reduce my risk for heart disease and stroke? The risk of heart disease, heart attack, and stroke increases as you age. One of the causes may be a change in the body's hormones during menopause. This can affect how your body uses dietary fats, triglycerides, and cholesterol. Heart attack and stroke are medical emergencies. There are many things that you can do to help prevent heart disease and stroke. Watch your blood pressure High  blood pressure causes heart disease and increases the risk of stroke. This is more likely to develop in people who have high blood pressure readings or are overweight. Have your blood pressure checked: Every 3-5 years if you are 18-39 years of age. Every year if you are 40 years old or older. Eat a healthy diet  Eat a diet that includes plenty of vegetables, fruits, low-fat dairy products, and lean protein. Do not eat a lot of foods that are high in solid fats, added sugars, or sodium. Get regular exercise Get regular exercise. This is one of the most important things you can do for your health. Most adults should: Try to exercise for at least 150 minutes each week. The exercise should increase your heart rate and make you sweat (moderate-intensity exercise). Try to do strengthening exercises at least twice each week. Do these in addition to the moderate-intensity exercise. Spend less time sitting. Even light physical activity can be beneficial. Other tips Work with your health care provider to achieve or maintain a healthy weight. Do not use any products that contain nicotine or tobacco. These products include cigarettes, chewing tobacco, and vaping devices, such as e-cigarettes. If you need help quitting, ask your health care provider. Know your numbers. Ask your health care provider to check your cholesterol and your blood sugar (glucose). Continue to have your blood tested as directed by your health care provider. Do I need screening for cancer? Depending on your health history and family history, you may need to have cancer screenings at different stages of your life. This may include screening for: Breast cancer. Cervical cancer. Lung cancer. Colorectal cancer. What is my risk for osteoporosis? After menopause, you may be   at increased risk for osteoporosis. Osteoporosis is a condition in which bone destruction happens more quickly than new bone creation. To help prevent osteoporosis or  the bone fractures that can happen because of osteoporosis, you may take the following actions: If you are 19-50 years old, get at least 1,000 mg of calcium and at least 600 international units (IU) of vitamin D per day. If you are older than age 50 but younger than age 70, get at least 1,200 mg of calcium and at least 600 international units (IU) of vitamin D per day. If you are older than age 70, get at least 1,200 mg of calcium and at least 800 international units (IU) of vitamin D per day. Smoking and drinking excessive alcohol increase the risk of osteoporosis. Eat foods that are rich in calcium and vitamin D, and do weight-bearing exercises several times each week as directed by your health care provider. How does menopause affect my mental health? Depression may occur at any age, but it is more common as you become older. Common symptoms of depression include: Feeling depressed. Changes in sleep patterns. Changes in appetite or eating patterns. Feeling an overall lack of motivation or enjoyment of activities that you previously enjoyed. Frequent crying spells. Talk with your health care provider if you think that you are experiencing any of these symptoms. General instructions See your health care provider for regular wellness exams and vaccines. This may include: Scheduling regular health, dental, and eye exams. Getting and maintaining your vaccines. These include: Influenza vaccine. Get this vaccine each year before the flu season begins. Pneumonia vaccine. Shingles vaccine. Tetanus, diphtheria, and pertussis (Tdap) booster vaccine. Your health care provider may also recommend other immunizations. Tell your health care provider if you have ever been abused or do not feel safe at home. Summary Menopause is a normal process in which your ability to get pregnant comes to an end. This condition causes hot flashes, night sweats, decreased interest in sex, mood swings, headaches, or lack  of sleep. Treatment for this condition may include hormone replacement therapy. Take actions to keep yourself healthy, including exercising regularly, eating a healthy diet, watching your weight, and checking your blood pressure and blood sugar levels. Get screened for cancer and depression. Make sure that you are up to date with all your vaccines. This information is not intended to replace advice given to you by your health care provider. Make sure you discuss any questions you have with your health care provider. Document Revised: 10/09/2020 Document Reviewed: 10/09/2020 Elsevier Patient Education  2023 Elsevier Inc.  

## 2021-11-09 NOTE — Assessment & Plan Note (Signed)
Encouraged diet and exercise for weight loss ?

## 2021-11-10 LAB — COMPLETE METABOLIC PANEL WITH GFR
AG Ratio: 2 (calc) (ref 1.0–2.5)
ALT: 40 U/L — ABNORMAL HIGH (ref 6–29)
AST: 26 U/L (ref 10–35)
Albumin: 4.2 g/dL (ref 3.6–5.1)
Alkaline phosphatase (APISO): 86 U/L (ref 37–153)
BUN: 12 mg/dL (ref 7–25)
CO2: 33 mmol/L — ABNORMAL HIGH (ref 20–32)
Calcium: 9.4 mg/dL (ref 8.6–10.4)
Chloride: 100 mmol/L (ref 98–110)
Creat: 0.94 mg/dL (ref 0.50–1.03)
Globulin: 2.1 g/dL (calc) (ref 1.9–3.7)
Glucose, Bld: 104 mg/dL (ref 65–139)
Potassium: 3.5 mmol/L (ref 3.5–5.3)
Sodium: 140 mmol/L (ref 135–146)
Total Bilirubin: 0.6 mg/dL (ref 0.2–1.2)
Total Protein: 6.3 g/dL (ref 6.1–8.1)
eGFR: 74 mL/min/{1.73_m2} (ref >=60)

## 2021-11-10 LAB — CBC
HCT: 42.7 % (ref 35.0–45.0)
Hemoglobin: 14.2 g/dL (ref 11.7–15.5)
MCH: 29.1 pg (ref 27.0–33.0)
MCHC: 33.3 g/dL (ref 32.0–36.0)
MCV: 87.5 fL (ref 80.0–100.0)
MPV: 10.5 fL (ref 7.5–12.5)
Platelets: 220 10*3/uL (ref 140–400)
RBC: 4.88 10*6/uL (ref 3.80–5.10)
RDW: 13.8 % (ref 11.0–15.0)
WBC: 6.5 10*3/uL (ref 3.8–10.8)

## 2021-11-10 LAB — TSH: TSH: 0.92 mIU/L

## 2021-11-10 LAB — HEMOGLOBIN A1C
Hgb A1c MFr Bld: 5.4 % of total Hgb (ref <5.7)
Mean Plasma Glucose: 108 mg/dL
eAG (mmol/L): 6 mmol/L

## 2021-11-10 LAB — LIPID PANEL
Cholesterol: 175 mg/dL (ref <200)
HDL: 59 mg/dL (ref >=50)
LDL Cholesterol (Calc): 98 mg/dL (calc)
Non-HDL Cholesterol (Calc): 116 mg/dL (calc) (ref <130)
Total CHOL/HDL Ratio: 3 (calc) (ref <5.0)
Triglycerides: 87 mg/dL (ref <150)

## 2021-11-15 ENCOUNTER — Telehealth: Payer: Self-pay

## 2021-11-15 NOTE — Telephone Encounter (Signed)
CALLED PATIENT NO ANSWER LEFT VOICEMAIL FOR A CALL BACK °Letter sent °

## 2021-11-21 ENCOUNTER — Encounter: Payer: Self-pay | Admitting: Internal Medicine

## 2021-11-21 MED ORDER — HYDROCHLOROTHIAZIDE 25 MG PO TABS: 25.0000 mg | ORAL_TABLET | Freq: Every day | ORAL | 1 refills | Status: DC

## 2021-11-30 ENCOUNTER — Ambulatory Visit: Payer: 59 | Admitting: Internal Medicine

## 2022-01-04 ENCOUNTER — Encounter: Payer: Self-pay | Admitting: Internal Medicine

## 2022-01-04 DIAGNOSIS — L659 Nonscarring hair loss, unspecified: Secondary | ICD-10-CM

## 2022-01-04 DIAGNOSIS — R5383 Other fatigue: Secondary | ICD-10-CM

## 2022-01-04 DIAGNOSIS — E010 Iodine-deficiency related diffuse (endemic) goiter: Secondary | ICD-10-CM

## 2022-01-15 ENCOUNTER — Telehealth: Payer: Self-pay

## 2022-01-15 NOTE — Telephone Encounter (Unsigned)
Copied from CRM [id number]. Topic: Referral - Status >> Jan 15, 2022  9:52 AM De Blanch wrote: Reason for CRM: Pt is calling to follow up on Endocrinology referral pt stated please send this to Dorisann Frames, MD in [facility]   - Address: [address], [place], [place] [address] Please fax it to -  [phone]   Please advise.

## 2022-01-16 NOTE — Telephone Encounter (Signed)
Could we get an update on endocrinology referral for this patient.  Jody Jensen, please let patient know I am checking with the referral coordinator about this appt

## 2022-01-17 NOTE — Telephone Encounter (Signed)
I sent her a my chart message.

## 2022-01-24 ENCOUNTER — Ambulatory Visit
Admission: RE | Admit: 2022-01-24 | Discharge: 2022-01-24 | Disposition: A | Discharge status: Home / Self Care | Payer: 59 | Source: Ambulatory Visit | Attending: Internal Medicine | Admitting: Internal Medicine

## 2022-01-24 DIAGNOSIS — Z1231 Encounter for screening mammogram for malignant neoplasm of breast: Secondary | ICD-10-CM | POA: Diagnosis present

## 2022-02-13 ENCOUNTER — Other Ambulatory Visit: Payer: Self-pay | Admitting: Home Modifications

## 2022-02-13 DIAGNOSIS — E010 Iodine-deficiency related diffuse (endemic) goiter: Secondary | ICD-10-CM

## 2022-02-18 ENCOUNTER — Other Ambulatory Visit: Payer: 59

## 2022-02-18 ENCOUNTER — Ambulatory Visit
Admission: RE | Admit: 2022-02-18 | Discharge: 2022-02-18 | Disposition: A | Discharge status: Home / Self Care | Payer: 59 | Source: Ambulatory Visit | Attending: Home Modifications | Admitting: Home Modifications

## 2022-02-18 DIAGNOSIS — E010 Iodine-deficiency related diffuse (endemic) goiter: Secondary | ICD-10-CM

## 2022-02-21 ENCOUNTER — Other Ambulatory Visit: Payer: Self-pay | Admitting: Home Modifications

## 2022-02-21 DIAGNOSIS — E041 Nontoxic single thyroid nodule: Secondary | ICD-10-CM

## 2022-02-26 ENCOUNTER — Other Ambulatory Visit (HOSPITAL_COMMUNITY)
Admission: RE | Admit: 2022-02-26 | Discharge: 2022-02-26 | Disposition: A | Discharge status: Home / Self Care | Payer: 59 | Source: Ambulatory Visit | Attending: Interventional Radiology | Admitting: Interventional Radiology

## 2022-02-26 ENCOUNTER — Ambulatory Visit
Admission: RE | Admit: 2022-02-26 | Discharge: 2022-02-26 | Disposition: A | Discharge status: Home / Self Care | Payer: 59 | Source: Ambulatory Visit | Attending: Home Modifications | Admitting: Home Modifications

## 2022-02-26 DIAGNOSIS — E041 Nontoxic single thyroid nodule: Secondary | ICD-10-CM | POA: Insufficient documentation

## 2022-02-28 LAB — CYTOLOGY - NON PAP

## 2022-03-07 ENCOUNTER — Other Ambulatory Visit: Payer: Self-pay | Admitting: Endocrinology

## 2022-03-07 DIAGNOSIS — E041 Nontoxic single thyroid nodule: Secondary | ICD-10-CM

## 2022-04-11 ENCOUNTER — Encounter: Payer: Self-pay | Admitting: Internal Medicine

## 2022-04-11 DIAGNOSIS — E041 Nontoxic single thyroid nodule: Secondary | ICD-10-CM

## 2022-05-16 DIAGNOSIS — E041 Nontoxic single thyroid nodule: Secondary | ICD-10-CM | POA: Insufficient documentation

## 2022-05-20 ENCOUNTER — Other Ambulatory Visit: Payer: Self-pay | Admitting: Internal Medicine

## 2022-05-21 NOTE — Telephone Encounter (Signed)
Patient needs OV, will refill mediation for 30 days until OV can be made. Patient needs OV for additional refills.  Requested Prescriptions  Pending Prescriptions Disp Refills   venlafaxine XR (EFFEXOR-XR) 150 MG 24 hr capsule [Pharmacy Med Name: Venlafaxine HCl ER 150 MG Oral Capsule Extended Release 24 Hour] 30 capsule 0    Sig: TAKE 1 CAPSULE BY MOUTH ONCE DAILY WITH BREAKFAST     Psychiatry: Antidepressants - SNRI - desvenlafaxine & venlafaxine Failed - 05/20/2022 10:22 AM      Failed - Valid encounter within last 6 months    Recent Outpatient Visits           6 months ago Encounter for general adult medical examination with abnormal findings   [facility] [place], Salvadore Oxford, NP   6 months ago Skin tags, multiple acquired   [facility] [place], Salvadore Oxford, NP   1 year ago Need for immunization against influenza   [facility] [place], Salvadore Oxford, NP   1 year ago Encounter for general adult medical examination with abnormal findings   [facility] [place], [place] W, NP              Failed - Lipid Panel in normal range within the last 12 months    Cholesterol  Date Value Ref Range Status  11/09/2021 175 <200 mg/dL Final   LDL Cholesterol (Calc)  Date Value Ref Range Status  11/09/2021 98 mg/dL (calc) Final    Comment:    Reference range: <100 . Desirable range <100 mg/dL for primary prevention;   <70 mg/dL for patients with CHD or diabetic patients  with > or = 2 CHD risk factors. Marland Kitchen LDL-C is now calculated using the Martin-Hopkins  calculation, which is a validated novel method providing  better accuracy than the Friedewald equation in the  estimation of LDL-C.  Horald Pollen et al. Lenox Ahr. 4174;081(44): 2061-2068  (http://education.[facility].com/faq/FAQ164)    HDL  Date Value Ref Range Status  11/09/2021 59 > OR = 50 mg/dL Final   Triglycerides  Date Value Ref Range Status  11/09/2021 87 <150 mg/dL Final          Passed - Cr in normal range and within 360 days    Creat  Date Value Ref Range Status  11/09/2021 0.94 0.50 - 1.03 mg/dL Final   Creatinine, Urine  Date Value Ref Range Status  02/02/2013 88.7 mg/dL Final         Passed - Last BP in normal range    BP Readings from Last 1 Encounters:  11/09/21 122/82

## 2022-05-23 ENCOUNTER — Other Ambulatory Visit: Payer: Self-pay | Admitting: Surgery

## 2022-05-23 DIAGNOSIS — E041 Nontoxic single thyroid nodule: Secondary | ICD-10-CM

## 2022-06-13 ENCOUNTER — Other Ambulatory Visit: Payer: Self-pay | Admitting: Internal Medicine

## 2022-06-16 ENCOUNTER — Other Ambulatory Visit: Payer: Self-pay | Admitting: Internal Medicine

## 2022-06-17 NOTE — Telephone Encounter (Signed)
Requested medication (s) are due for refill today: yes  Requested medication (s) are on the active medication list: yes  Last refill:  05/21/22  Future visit scheduled: yes  Notes to clinic:  Unable to refill per protocol, courtesy refill already given, routing for provider approval.      Requested Prescriptions  Pending Prescriptions Disp Refills   venlafaxine XR (EFFEXOR-XR) 150 MG 24 hr capsule [Pharmacy Med Name: Venlafaxine HCl ER 150 MG Oral Capsule Extended Release 24 Hour] 30 capsule 0    Sig: TAKE 1 CAPSULE BY MOUTH ONCE DAILY WITH BREAKFAST     Psychiatry: Antidepressants - SNRI - desvenlafaxine & venlafaxine Failed - 06/16/2022  8:03 PM      Failed - Valid encounter within last 6 months    Recent Outpatient Visits           7 months ago Encounter for general adult medical examination with abnormal findings   [facility] [place], Coralie Keens, NP   7 months ago Skin tags, multiple acquired   [place], Coralie Keens, NP   1 year ago Need for immunization against influenza   [facility] [place], Coralie Keens, NP   1 year ago Encounter for general adult medical examination with abnormal findings   [facility] [place], [place] W, NP              Failed - Lipid Panel in normal range within the last 12 months    Cholesterol  Date Value Ref Range Status  11/09/2021 175 <200 mg/dL Final   LDL Cholesterol (Calc)  Date Value Ref Range Status  11/09/2021 98 mg/dL (calc) Final    Comment:    Reference range: <100 . Desirable range <100 mg/dL for primary prevention;   <70 mg/dL for patients with CHD or diabetic patients  with > or = 2 CHD risk factors. Marland Kitchen LDL-C is now calculated using the Martin-Hopkins  calculation, which is a validated novel method providing  better accuracy than the Friedewald equation in the  estimation of LDL-C.  Cresenciano Genre et al. Annamaria Helling. 8338;250(53): 2061-2068   (http://education.[facility].com/faq/FAQ164)    HDL  Date Value Ref Range Status  11/09/2021 59 > OR = 50 mg/dL Final   Triglycerides  Date Value Ref Range Status  11/09/2021 87 <150 mg/dL Final         Passed - Cr in normal range and within 360 days    Creat  Date Value Ref Range Status  11/09/2021 0.94 0.50 - 1.03 mg/dL Final   Creatinine, Urine  Date Value Ref Range Status  02/02/2013 88.7 mg/dL Final         Passed - Last BP in normal range    BP Readings from Last 1 Encounters:  11/09/21 122/82

## 2022-06-21 ENCOUNTER — Other Ambulatory Visit: Payer: Self-pay | Admitting: Internal Medicine

## 2022-06-24 NOTE — Telephone Encounter (Signed)
Requested medication (s) are due for refill today:   Yes  Requested medication (s) are on the active medication list:   Yes  Future visit scheduled:   Yes 06/28/2022   Last ordered: 05/21/2022 #30, 0 refill  Returned for provider review prior to upcoming appt.   Requested Prescriptions  Pending Prescriptions Disp Refills   venlafaxine XR (EFFEXOR-XR) 150 MG 24 hr capsule [Pharmacy Med Name: Venlafaxine HCl ER 150 MG Oral Capsule Extended Release 24 Hour] 30 capsule 0    Sig: TAKE 1 CAPSULE BY MOUTH ONCE DAILY WITH BREAKFAST     Psychiatry: Antidepressants - SNRI - desvenlafaxine & venlafaxine Failed - 06/21/2022 12:24 PM      Failed - Valid encounter within last 6 months    Recent Outpatient Visits           7 months ago Encounter for general adult medical examination with abnormal findings   [facility] [place], Coralie Keens, NP   7 months ago Skin tags, multiple acquired   [facility] [place], Coralie Keens, NP   1 year ago Need for immunization against influenza   [facility] [place], Coralie Keens, NP   1 year ago Encounter for general adult medical examination with abnormal findings   [facility] [place], Coralie Keens, NP       Future Appointments             In 4 days Jearld Fenton, NP [facility]            Failed - Lipid Panel in normal range within the last 12 months    Cholesterol  Date Value Ref Range Status  11/09/2021 175 <200 mg/dL Final   LDL Cholesterol (Calc)  Date Value Ref Range Status  11/09/2021 98 mg/dL (calc) Final    Comment:    Reference range: <100 . Desirable range <100 mg/dL for primary prevention;   <70 mg/dL for patients with CHD or diabetic patients  with > or = 2 CHD risk factors. Marland Kitchen LDL-C is now calculated using the Martin-Hopkins  calculation, which is a validated novel method providing  better accuracy than  the Friedewald equation in the  estimation of LDL-C.  Cresenciano Genre et al. Annamaria Helling. 7425;956(38): 2061-2068  (http://education.[facility].com/faq/FAQ164)    HDL  Date Value Ref Range Status  11/09/2021 59 > OR = 50 mg/dL Final   Triglycerides  Date Value Ref Range Status  11/09/2021 87 <150 mg/dL Final         Passed - Cr in normal range and within 360 days    Creat  Date Value Ref Range Status  11/09/2021 0.94 0.50 - 1.03 mg/dL Final   Creatinine, Urine  Date Value Ref Range Status  02/02/2013 88.7 mg/dL Final         Passed - Last BP in normal range    BP Readings from Last 1 Encounters:  11/09/21 122/82

## 2022-06-28 ENCOUNTER — Ambulatory Visit: Payer: 59 | Admitting: Internal Medicine

## 2022-06-28 ENCOUNTER — Encounter: Payer: Self-pay | Admitting: Internal Medicine

## 2022-06-28 VITALS — BP 126/84 | HR 89 | Temp 96.6°F | Wt 281.0 lb

## 2022-06-28 DIAGNOSIS — F419 Anxiety disorder, unspecified: Secondary | ICD-10-CM

## 2022-06-28 DIAGNOSIS — E041 Nontoxic single thyroid nodule: Secondary | ICD-10-CM | POA: Insufficient documentation

## 2022-06-28 DIAGNOSIS — M159 Polyosteoarthritis, unspecified: Secondary | ICD-10-CM

## 2022-06-28 DIAGNOSIS — K2100 Gastro-esophageal reflux disease with esophagitis, without bleeding: Secondary | ICD-10-CM

## 2022-06-28 DIAGNOSIS — J4530 Mild persistent asthma, uncomplicated: Secondary | ICD-10-CM

## 2022-06-28 DIAGNOSIS — Z6841 Body Mass Index (BMI) 40.0 and over, adult: Secondary | ICD-10-CM

## 2022-06-28 DIAGNOSIS — Z23 Encounter for immunization: Secondary | ICD-10-CM

## 2022-06-28 DIAGNOSIS — I10 Essential (primary) hypertension: Secondary | ICD-10-CM | POA: Diagnosis not present

## 2022-06-28 DIAGNOSIS — K760 Fatty (change of) liver, not elsewhere classified: Secondary | ICD-10-CM | POA: Diagnosis not present

## 2022-06-28 DIAGNOSIS — R7303 Prediabetes: Secondary | ICD-10-CM

## 2022-06-28 DIAGNOSIS — G4733 Obstructive sleep apnea (adult) (pediatric): Secondary | ICD-10-CM

## 2022-06-28 MED ORDER — FAMOTIDINE 20 MG PO TABS: 20.0000 mg | ORAL_TABLET | Freq: Every day | ORAL | 1 refills | Status: DC

## 2022-06-28 MED ORDER — MONTELUKAST SODIUM 10 MG PO TABS: 10.0000 mg | ORAL_TABLET | ORAL | 1 refills | Status: DC | PRN

## 2022-06-28 MED ORDER — LEVOCETIRIZINE DIHYDROCHLORIDE 5 MG PO TABS: 5.0000 mg | ORAL_TABLET | Freq: Every morning | ORAL | 1 refills | Status: DC

## 2022-06-28 MED ORDER — VENLAFAXINE HCL ER 150 MG PO CP24: 150.0000 mg | ORAL_CAPSULE | Freq: Every day | ORAL | 1 refills | Status: DC

## 2022-06-28 MED ORDER — HYDROCHLOROTHIAZIDE 50 MG PO TABS: 50.0000 mg | ORAL_TABLET | Freq: Every day | ORAL | 1 refills | Status: DC

## 2022-06-28 NOTE — Assessment & Plan Note (Signed)
Continue venlafaxine Support offered

## 2022-06-28 NOTE — Assessment & Plan Note (Signed)
C-Met today Encourage diet and exercise for weight loss 

## 2022-06-28 NOTE — Assessment & Plan Note (Signed)
Will increase HCTZ to 50 mg daily Reinforced DASH diet and exercise for weight loss C-Met today

## 2022-06-28 NOTE — Assessment & Plan Note (Signed)
Encourage diet and exercise for weight loss 

## 2022-06-28 NOTE — Assessment & Plan Note (Signed)
Continue Singulair and albuterol 

## 2022-06-28 NOTE — Assessment & Plan Note (Signed)
Encourage weight loss as this can produce joint pain Continue Tylenol and ibuprofen OTC as needed

## 2022-06-28 NOTE — Assessment & Plan Note (Signed)
A1c today Encourage low-carb diet and exercise for weight loss 

## 2022-06-28 NOTE — Assessment & Plan Note (Signed)
Encourage weight loss as this can help reduce sleep apnea symptoms Continue dental device

## 2022-06-28 NOTE — Patient Instructions (Signed)

## 2022-06-28 NOTE — Progress Notes (Signed)
Subjective:    Patient ID: Jody Jensen, female    DOB: 01/30/1971, 52 y.o.   MRN: [id number]  HPI  Patient presents to clinic today for 49-month follow-up of chronic conditions.  HTN: Her BP today is 126/84.  She is taking HCTZ as prescribed.  ECG from 01/2021 reviewed.  OA: Mainly in her lower back and left knee.  She takes Ibuprofen or Tylenol OTC as needed.  She has seen orthopedics in the past.  Anxiety: Chronic, managed on Venlafaxine.  She is not currently seeing a therapist.  She denies depression, SI/HI.  GERD: Intermittent.  She takes Famotidine as needed with good relief of symptoms.  There is no upper GI on file.  Asthma: She denies chronic cough or shortness of breath.  She is using Singulair and Albuterol as prescribed.  There are no PFTs on file.  She does not follow pulmonology.  OSA: This is managed with a mouthguard.  Sleep study from 09/2021 reviewed.  Fatty Liver Disease: Her last AST/ALT was 26/40, 11/2021.  She has seen GI in the past for the same.  Prediabetes: Her last A1c was 5.4%, 11/2021.  She is not taking any oral diabetic medication at this time.  She does not check her sugars.  Thyroid Nodule: She had a prior biopsy which was inconclusive.  She is scheduled to have a repeat biopsy.  They think the reason that she is having hoarseness and intermittent difficulty swallowing is secondary to this thyroid nodule.  She reports her TSH has been normal.  Review of Systems     Past Medical History:  Diagnosis Date   Anxiety    Diabetes mellitus without complication (HCC)    Hyperlipidemia    Hypertension    Obesity    OSA (obstructive sleep apnea)    Ovarian cyst    Sleep apnea     Current Outpatient Medications  Medication Sig Dispense Refill   albuterol (ACCUNEB) 0.63 MG/3ML nebulizer solution Take 1 ampule by nebulization every 6 (six) hours as needed for wheezing.     albuterol (VENTOLIN HFA) 108 (90 Base) MCG/ACT inhaler Inhale 1-2 puffs into the  lungs every 6 (six) hours as needed. 8 g 0   famotidine (PEPCID) 20 MG tablet Take 1 tablet (20 mg total) by mouth at bedtime. 90 tablet 3   hydrochlorothiazide (HYDRODIURIL) 25 MG tablet Take 1 tablet by mouth once daily 90 tablet 0   levocetirizine (XYZAL) 5 MG tablet TAKE 1 TABLET BY MOUTH IN THE MORNING 90 tablet 0   montelukast (SINGULAIR) 10 MG tablet TAKE 1 TABLET BY MOUTH AS NEEDED 90 tablet 0   venlafaxine XR (EFFEXOR-XR) 150 MG 24 hr capsule TAKE 1 CAPSULE BY MOUTH ONCE DAILY WITH BREAKFAST 90 capsule 0   No current facility-administered medications for this visit.    No Known Allergies  Family History  Problem Relation Age of Onset   Hypertension Mother    Hyperlipidemia Mother    Heart disease Mother    Diabetes Mother    Asthma Mother    Thyroid disease Mother    Lupus Mother    Immunodeficiency Mother    Heart disease Father    Hypertension Father    Cervical cancer Paternal Grandmother        cervical   Leukemia Paternal Grandmother    Diabetes Paternal Grandfather    Stroke Paternal Grandfather    Diabetes Paternal Aunt    Prostate cancer Paternal Uncle    Heart disease Paternal  Uncle     Social History   Socioeconomic History   Marital status: Married    Spouse name: Not on file   Number of children: 2   Years of education: Not on file   Highest education level: Not on file  Occupational History   Not on file  Tobacco Use   Smoking status: Former    Packs/day: 2.00    Years: 12.00    Total pack years: 24.00    Types: Cigarettes    Quit date: 02/03/2012    Years since quitting: 10.4   Smokeless tobacco: Never  Vaping Use   Vaping Use: Former   Devices: quit in july 2022  Substance and Sexual Activity   Alcohol use: Yes    Comment: rare   Drug use: No   Sexual activity: Yes    Birth control/protection: Surgical  Other Topics Concern   Not on file  Social History Narrative   Not on file   Social Determinants of Health   Financial  Resource Strain: Not on file  Food Insecurity: Not on file  Transportation Needs: Not on file  Physical Activity: Not on file  Stress: Not on file  Social Connections: Not on file  Intimate Partner Violence: Not on file     Constitutional: Denies fever, malaise, fatigue, headache or abrupt weight changes.  HEENT: Denies eye pain, eye redness, ear pain, ringing in the ears, wax buildup, runny nose, nasal congestion, bloody nose, or sore throat. Respiratory: Denies difficulty breathing, shortness of breath, cough or sputum production.   Cardiovascular: Denies chest pain, chest tightness, palpitations or swelling in the hands or feet.  Gastrointestinal: Pt reports intermittent reflux, intermittent difficulty swallowing. Denies abdominal pain, bloating, constipation, diarrhea or blood in the stool.  GU: Denies urgency, frequency, pain with urination, burning sensation, blood in urine, odor or discharge. Musculoskeletal: Pt reports joint pain. Denies decrease in range of motion, difficulty with gait, muscle pain or joint swelling.  Skin: Denies redness, rashes, lesions or ulcercations.  Neurological: Denies dizziness, difficulty with memory, difficulty with speech or problems with balance and coordination.  Psych: Pt has a history of anxiety. Denies depression, SI/HI.  No other specific complaints in a complete review of systems (except as listed in HPI above).  Objective:   Physical Exam  BP 126/84 (BP Location: Right Arm, Patient Position: Sitting, Cuff Size: Large)   Pulse 89   Temp (!) 96.6 F (35.9 C) (Temporal)   Wt 281 lb (127.5 kg)   LMP 11/02/1997   SpO2 97%   BMI 49.00 kg/m   Wt Readings from Last 3 Encounters:  11/09/21 257 lb (116.6 kg)  11/02/21 258 lb 3.2 oz (117.1 kg)  09/05/21 261 lb 3.2 oz (118.5 kg)    General: Appears her stated age, obese, in NAD. Skin: Warm, dry and intact.  HEENT: Head: normal shape and size; Eyes: sclera white, no icterus, conjunctiva  pink, PERRLA and EOMs intact;  Neck:  Neck supple, trachea midline.  Thyroid nodule noted on the right. Cardiovascular: Normal rate and rhythm. S1,S2 noted.  No murmur, rubs or gallops noted. No JVD or BLE edema. No carotid bruits noted. Pulmonary/Chest: Normal effort and positive vesicular breath sounds. No respiratory distress. No wheezes, rales or ronchi noted.  Abdomen: Soft and nontender. Normal bowel sounds.  Musculoskeletal:  No difficulty with gait.  Neurological: Alert and oriented. Coordination normal.  Psychiatric: Mood and affect normal. Behavior is normal. Judgment and thought content normal.    BMET  Component Value Date/Time   NA 140 11/09/2021 1358   NA 141 04/20/2020 0957   K 3.5 11/09/2021 1358   CL 100 11/09/2021 1358   CO2 33 (H) 11/09/2021 1358   GLUCOSE 104 11/09/2021 1358   BUN 12 11/09/2021 1358   BUN 11 04/20/2020 0957   CREATININE 0.94 11/09/2021 1358   CALCIUM 9.4 11/09/2021 1358   GFRNONAA >60 12/29/2020 1924   GFRAA 113 04/20/2020 0957    Lipid Panel     Component Value Date/Time   CHOL 175 11/09/2021 1358   TRIG 87 11/09/2021 1358   HDL 59 11/09/2021 1358   CHOLHDL 3.0 11/09/2021 1358   VLDL 20.6 09/01/2015 0815   LDLCALC 98 11/09/2021 1358    CBC    Component Value Date/Time   WBC 6.5 11/09/2021 1358   RBC 4.88 11/09/2021 1358   HGB 14.2 11/09/2021 1358   HCT 42.7 11/09/2021 1358   PLT 220 11/09/2021 1358   MCV 87.5 11/09/2021 1358   MCH 29.1 11/09/2021 1358   MCHC 33.3 11/09/2021 1358   RDW 13.8 11/09/2021 1358   LYMPHSABS 2.7 12/02/2012 1521   MONOABS 0.5 12/02/2012 1521   EOSABS 0.2 12/02/2012 1521   BASOSABS 0.0 12/02/2012 1521    Hgb A1C Lab Results  Component Value Date   HGBA1C 5.4 11/09/2021           Assessment & Plan:     RTC in 6 months for your annual exam Webb Silversmith, NP

## 2022-06-28 NOTE — Assessment & Plan Note (Signed)
Repeat biopsy pending

## 2022-06-28 NOTE — Assessment & Plan Note (Signed)
Encourage weight loss as this can help reduce reflux symptoms Continue famotidine 

## 2022-06-29 LAB — COMPLETE METABOLIC PANEL WITH GFR
AG Ratio: 1.8 (calc) (ref 1.0–2.5)
ALT: 42 U/L — ABNORMAL HIGH (ref 6–29)
AST: 29 U/L (ref 10–35)
Albumin: 3.9 g/dL (ref 3.6–5.1)
Alkaline phosphatase (APISO): 76 U/L (ref 37–153)
BUN: 14 mg/dL (ref 7–25)
CO2: 31 mmol/L (ref 20–32)
Calcium: 8.9 mg/dL (ref 8.6–10.4)
Chloride: 102 mmol/L (ref 98–110)
Creat: 0.78 mg/dL (ref 0.50–1.03)
Globulin: 2.2 g/dL (calc) (ref 1.9–3.7)
Glucose, Bld: 116 mg/dL — ABNORMAL HIGH (ref 65–99)
Potassium: 4.2 mmol/L (ref 3.5–5.3)
Sodium: 142 mmol/L (ref 135–146)
Total Bilirubin: 0.5 mg/dL (ref 0.2–1.2)
Total Protein: 6.1 g/dL (ref 6.1–8.1)
eGFR: 92 mL/min/{1.73_m2} (ref >=60)

## 2022-06-29 LAB — CBC
HCT: 42.2 % (ref 35.0–45.0)
Hemoglobin: 14.1 g/dL (ref 11.7–15.5)
MCH: 29.1 pg (ref 27.0–33.0)
MCHC: 33.4 g/dL (ref 32.0–36.0)
MCV: 87.2 fL (ref 80.0–100.0)
MPV: 10.4 fL (ref 7.5–12.5)
Platelets: 185 10*3/uL (ref 140–400)
RBC: 4.84 10*6/uL (ref 3.80–5.10)
RDW: 13.9 % (ref 11.0–15.0)
WBC: 4.7 10*3/uL (ref 3.8–10.8)

## 2022-06-29 LAB — HEMOGLOBIN A1C
Hgb A1c MFr Bld: 6.1 % of total Hgb — ABNORMAL HIGH (ref <5.7)
Mean Plasma Glucose: 128 mg/dL
eAG (mmol/L): 7.1 mmol/L

## 2022-06-29 LAB — LIPID PANEL
Cholesterol: 189 mg/dL (ref <200)
HDL: 68 mg/dL (ref >=50)
LDL Cholesterol (Calc): 99 mg/dL (calc)
Non-HDL Cholesterol (Calc): 121 mg/dL (calc) (ref <130)
Total CHOL/HDL Ratio: 2.8 (calc) (ref <5.0)
Triglycerides: 119 mg/dL (ref <150)

## 2022-07-03 ENCOUNTER — Other Ambulatory Visit (HOSPITAL_COMMUNITY)
Admission: RE | Admit: 2022-07-03 | Discharge: 2022-07-03 | Disposition: A | Discharge status: Home / Self Care | Payer: 59 | Source: Ambulatory Visit | Attending: Student | Admitting: Student

## 2022-07-03 ENCOUNTER — Ambulatory Visit
Admission: RE | Admit: 2022-07-03 | Discharge: 2022-07-03 | Disposition: A | Discharge status: Home / Self Care | Payer: 59 | Source: Ambulatory Visit | Attending: Surgery | Admitting: Surgery

## 2022-07-03 DIAGNOSIS — E041 Nontoxic single thyroid nodule: Secondary | ICD-10-CM | POA: Insufficient documentation

## 2022-07-05 LAB — CYTOLOGY - NON PAP

## 2022-07-06 NOTE — Progress Notes (Signed)
Biopsy sample has been sent for [facility].  We will notify you when we have received your results.  Usually takes about 3 weeks.  Snyder, MD [facility] [place] practice Office: [phone]

## 2022-07-26 ENCOUNTER — Encounter (HOSPITAL_COMMUNITY): Payer: Self-pay

## 2022-07-26 NOTE — Progress Notes (Signed)
Great news!  Final pathology report from [facility] is benign.  We will plan to repeat the USN and TSH level later this year and then see you in the office for physical exam.  tmg  Armandina Gemma, [place] [facility] [place] practice Office: [phone]

## 2022-08-06 ENCOUNTER — Other Ambulatory Visit: Payer: Self-pay | Admitting: Endocrinology

## 2022-08-06 DIAGNOSIS — E041 Nontoxic single thyroid nodule: Secondary | ICD-10-CM

## 2023-01-21 ENCOUNTER — Other Ambulatory Visit: Payer: Self-pay | Admitting: Internal Medicine

## 2023-01-22 NOTE — Telephone Encounter (Signed)
Requested Prescriptions  Pending Prescriptions Disp Refills   famotidine (PEPCID) 20 MG tablet [Pharmacy Med Name: Famotidine 20 MG Oral Tablet] 90 tablet 0    Sig: TAKE 1 TABLET BY MOUTH AT BEDTIME     Gastroenterology:  H2 Antagonists Passed - 01/21/2023  6:43 AM      Passed - Valid encounter within last 12 months    Recent Outpatient Visits           6 months ago Prediabetes   [place] [facility] [place], Salvadore Oxford, NP   1 year ago Encounter for general adult medical examination with abnormal findings   [place] [facility] [place], Salvadore Oxford, NP   1 year ago Skin tags, multiple acquired   [facility] [place], Salvadore Oxford, NP   1 year ago Need for immunization against influenza   [facility] [place], Salvadore Oxford, NP   2 years ago Encounter for general adult medical examination with abnormal findings    [facility] [place], Salvadore Oxford, NP

## 2023-01-31 ENCOUNTER — Encounter: Payer: Self-pay | Admitting: Internal Medicine

## 2023-01-31 ENCOUNTER — Ambulatory Visit (INDEPENDENT_AMBULATORY_CARE_PROVIDER_SITE_OTHER): Payer: 59 | Admitting: Internal Medicine

## 2023-01-31 VITALS — BP 124/74 | HR 71 | Temp 98.4°F | Ht 63.5 in | Wt 263.8 lb

## 2023-01-31 DIAGNOSIS — G4733 Obstructive sleep apnea (adult) (pediatric): Secondary | ICD-10-CM

## 2023-01-31 NOTE — Patient Instructions (Addendum)
We will start auto CPAP therapy as soon as possible AutoCPAP 5-12 cm h20  Patient Instructions  Change equipment every 30 days or as directed by DME.  Wash your tubing with warm soap and water daily, hang to dry. Wash humidifier portion weekly. Use bottled, distilled water and change daily   Be aware of reduced alertness and do not drive or operate heavy machinery if experiencing this or drowsiness.  Exercise encouraged, as tolerated. Encouraged proper weight management.  Important to get eight or more hours of sleep  Limiting the use of the computer and television before bedtime.  Decrease naps during the day, so night time sleep will become enhanced.  Limit caffeine, and sleep deprivation.  HTN, stroke, uncontrolled diabetes and heart failure are potential risk factors.  Risk of untreated sleep apnea including cardiac arrhthymias, stroke, DM, pulm HTN.     Recommend weight loss

## 2023-01-31 NOTE — Progress Notes (Signed)
[facility] Consultation      Date: 01/31/2023,   MRN# [id number] Jody Jensen May 22, 1971      Admission                  Current  Jody Jensen is a 52 y.o. old female seen in consultation for wheezing at the request of Baity.     CHIEF COMPLAINT:   Follow-up OSA  Follow-up wheezing    HISTORY OF PRESENT ILLNESS  Regarding OSA Severe OSA AHI of 77 Started all device 1 year ago However at this time seems like the oral device is not helping anymore Patient feels more tired less energy More fatigue Morning headaches Patient will need to start CPAP therapy as soon as possible Patient stopped smoking and vaping  No longer using inhalers  Current weight 263 Weighed 296 in 01/2021   No exacerbation at this time No evidence of heart failure at this time No evidence or signs of infection at this time No respiratory distress No fevers, chills, nausea, vomiting, diarrhea No evidence of lower extremity edema No evidence hemoptysis     PAST MEDICAL HISTORY   Past Medical History:  Diagnosis Date   Anxiety    Diabetes mellitus without complication (HCC)    Hyperlipidemia    Hypertension    Obesity    OSA (obstructive sleep apnea)    Ovarian cyst    Sleep apnea      SURGICAL HISTORY   Past Surgical History:  Procedure Laterality Date   ABDOMINAL HYSTERECTOMY      partial, ovaries remain, no cervix   bone spur Right    BREAST EXCISIONAL BIOPSY Left    BREAST SURGERY     benign breast cyst removal   CESAREAN SECTION     CHOLECYSTECTOMY     NASAL SINUS SURGERY     ovarian cyst removal     TUBAL LIGATION       FAMILY HISTORY   Family History  Problem Relation Age of Onset   Hypertension Mother    Hyperlipidemia Mother    Heart disease Mother    Diabetes Mother    Asthma Mother    Thyroid disease Mother    Lupus Mother    Immunodeficiency Mother    Heart disease Father    Hypertension Father    Cervical cancer Paternal  Grandmother        cervical   Leukemia Paternal Grandmother    Diabetes Paternal Grandfather    Stroke Paternal Grandfather    Diabetes Paternal Aunt    Prostate cancer Paternal Uncle    Heart disease Paternal Uncle      SOCIAL HISTORY   Social History   Tobacco Use   Smoking status: Former    Current packs/day: 0.00    Average packs/day: 2.0 packs/day for 12.0 years (24.0 ttl pk-yrs)    Types: Cigarettes    Start date: 02/03/2000    Quit date: 02/03/2012    Years since quitting: 11.0   Smokeless tobacco: Never  Vaping Use   Vaping status: Former   Devices: quit in july 2022  Substance Use Topics   Alcohol use: Yes    Comment: rare   Drug use: No     MEDICATIONS    Home Medication:  Current Outpatient Rx   Order #: [id number] Class: Historical Med   Order #: [id number] Class: Normal   Order #: [id number] Class: Normal   Order #: [id number] Class: Normal   Order #: [id number] Class: Normal  Order #: [id number] Class: Normal   Order #: [id number] Class: Normal    Current Medication:  Current Outpatient Medications:    albuterol (ACCUNEB) 0.63 MG/3ML nebulizer solution, Take 1 ampule by nebulization every 6 (six) hours as needed for wheezing., Disp: , Rfl:    albuterol (VENTOLIN HFA) 108 (90 Base) MCG/ACT inhaler, Inhale 1-2 puffs into the lungs every 6 (six) hours as needed., Disp: 8 g, Rfl: 0   famotidine (PEPCID) 20 MG tablet, TAKE 1 TABLET BY MOUTH AT BEDTIME, Disp: 90 tablet, Rfl: 0   hydrochlorothiazide (HYDRODIURIL) 50 MG tablet, Take 1 tablet (50 mg total) by mouth daily., Disp: 90 tablet, Rfl: 1   levocetirizine (XYZAL) 5 MG tablet, Take 1 tablet (5 mg total) by mouth every morning., Disp: 90 tablet, Rfl: 1   montelukast (SINGULAIR) 10 MG tablet, Take 1 tablet (10 mg total) by mouth as needed., Disp: 90 tablet, Rfl: 1   venlafaxine XR (EFFEXOR-XR) 150 MG 24 hr capsule, Take 1 capsule (150 mg total) by mouth daily with breakfast., Disp: 90 capsule, Rfl: 1    ALLERGIES    Patient has no known allergies.   BP 124/74 (BP Location: Left Arm, Cuff Size: Normal)   Pulse 71   Temp 98.4 F (36.9 C) (Oral)   Ht 5' 3.5" (1.613 m)   Wt 263 lb 12.8 oz (119.7 kg)   LMP 11/02/1997   SpO2 98%   BMI 46.00 kg/m     Review of Systems: Gen:  Denies  fever, sweats, chills weight loss  HEENT: Denies blurred vision, double vision, ear pain, eye pain, hearing loss, nose bleeds, sore throat Cardiac:  No dizziness, chest pain or heaviness, chest tightness,edema, No JVD Resp:   No cough, -sputum production, -shortness of breath,-wheezing, -hemoptysis,  Other:  All other systems negative   Physical Examination:   General Appearance: No distress  EYES PERRLA, EOM intact.   NECK Supple, No JVD Pulmonary: normal breath sounds, No wheezing.  CardiovascularNormal S1,S2.  No m/r/g.   Abdomen: Benign, Soft, non-tender. Neurology UE/LE 5/5 strength, no focal deficits Ext pulses intact, cap refill intact ALL OTHER ROS ARE NEGATIVE      IMAGING    Chest x-ray 12/29/2020 Personally reviewed by me today No acute pneumonia or effusions at this time  ASSESSMENT/PLAN   52 year old morbidly obese white female seen today for follow-up severe sleep apnea with severe reactive airways disease and asthma  Has a diagnosis of severe OSA with AHI of 77  Initially patient had oral device which seem to be helping however this time the oral device does not seem to be helping at this time   Severe sleep apnea Patient has hide and failed oral device Patient will need to start auto CPAP therapy as soon as possible  Asthma mild intermittent Well-controlled with avoidance of smoking and vaping No longer using inhalers Avoid secondhand smoke Avoid SICK contacts Recommend  Masking  when appropriate Recommend Keep up-to-date with vaccinations     Patient Instructions  Be aware of reduced alertness and do not drive or operate heavy machinery if experiencing this or  drowsiness.  Exercise encouraged, as tolerated. Encouraged proper weight management.  Important to get eight or more hours of sleep  Limiting the use of the computer and television before bedtime.  Decrease naps during the day, so night time sleep will become enhanced.  Limit caffeine, and sleep deprivation.  HTN, stroke, uncontrolled diabetes and heart failure are potential risk factors.  Risk of untreated sleep apnea  including cardiac arrhthymias, stroke, DM, pulm HTN.      MEDICATION ADJUSTMENTS/LABS AND TESTS ORDERED: Start auto CPAP 5 to 12 cm water pressure Recommend weight loss  CURRENT MEDICATIONS REVIEWED AT LENGTH WITH PATIENT TODAY   Patient  satisfied with Plan of action and management. All questions answered  Follow up 6 months  Total Time Spent 24 mins   Wallis Bamberg Santiago Glad, M.D.  [facility]  Medical Director [facility] Medical Director [facility] Cardio-Pulmonary Department

## 2023-03-14 ENCOUNTER — Telehealth: Payer: 59 | Admitting: Primary Care

## 2023-03-22 ENCOUNTER — Other Ambulatory Visit: Payer: Self-pay | Admitting: Internal Medicine

## 2023-03-24 ENCOUNTER — Encounter: Payer: Self-pay | Admitting: *Deleted

## 2023-03-24 NOTE — Telephone Encounter (Signed)
Requested medication (s) are due for refill today:   Yes for Effexor.  Hydrochlorothiazide 25 mg has been discontiued 06/28/2022 due to dose change to 50 mg  Requested medication (s) are on the active medication list:   Yes for Effexor  Future visit scheduled:   No.    Needs 6 month check.   Message sent via MyChart to make appt.   Last ordered: hydrochlorothiazide 25 mg discontinued 06/28/2022;   Effexor 06/28/2022 #90, 1 refill.  Unable to refill Effexor because a 6 month visit is due per protocol.     Requested Prescriptions  Pending Prescriptions Disp Refills   hydrochlorothiazide (HYDRODIURIL) 25 MG tablet [Pharmacy Med Name: hydroCHLOROthiazide 25 MG Oral Tablet] 90 tablet 0    Sig: Take 1 tablet by mouth once daily     Cardiovascular: Diuretics - Thiazide Failed - 03/22/2023  6:46 AM      Failed - Cr in normal range and within 180 days    Creat  Date Value Ref Range Status  06/28/2022 0.78 0.50 - 1.03 mg/dL Final   Creatinine, Urine  Date Value Ref Range Status  02/02/2013 88.7 mg/dL Final         Failed - K in normal range and within 180 days    Potassium  Date Value Ref Range Status  06/28/2022 4.2 3.5 - 5.3 mmol/L Final         Failed - Na in normal range and within 180 days    Sodium  Date Value Ref Range Status  06/28/2022 142 135 - 146 mmol/L Final  04/20/2020 141 134 - 144 mmol/L Final         Failed - Valid encounter within last 6 months    Recent Outpatient Visits           8 months ago Prediabetes   [facility] [place], Salvadore Oxford, NP   1 year ago Encounter for general adult medical examination with abnormal findings   [place] [facility] [place], Salvadore Oxford, NP   1 year ago Skin tags, multiple acquired   [place] [place], Salvadore Oxford, NP   2 years ago Need for immunization against influenza   [place] [place], Salvadore Oxford, NP   2 years ago Encounter for  general adult medical examination with abnormal findings   [facility] [place], Salvadore Oxford, NP       Future Appointments             In 3 weeks Glenford Bayley, NP  [place] [facility] - Last BP in normal range    BP Readings from Last 1 Encounters:  01/31/23 124/74          venlafaxine XR (EFFEXOR-XR) 150 MG 24 hr capsule [Pharmacy Med Name: Venlafaxine HCl ER 150 MG Oral Capsule Extended Release 24 Hour] 90 capsule 0    Sig: TAKE 1 CAPSULE BY MOUTH ONCE DAILY WITH BREAKFAST     Psychiatry: Antidepressants - SNRI - desvenlafaxine & venlafaxine Failed - 03/22/2023  6:46 AM      Failed - Valid encounter within last 6 months    Recent Outpatient Visits           8 months ago Prediabetes   [place] [facility] [place], Salvadore Oxford, NP   1 year  ago Encounter for general adult medical examination with abnormal findings   [place] [place], Salvadore Oxford, NP   1 year ago Skin tags, multiple acquired   [facility] [place], Salvadore Oxford, NP   2 years ago Need for immunization against influenza   [facility] [place], Salvadore Oxford, NP   2 years ago Encounter for general adult medical examination with abnormal findings   [place] [place], Salvadore Oxford, NP       Future Appointments             In 3 weeks Glenford Bayley, NP [place] [facility]            Failed - Lipid Panel in normal range within the last 12 months    Cholesterol  Date Value Ref Range Status  06/28/2022 189 <200 mg/dL Final   LDL Cholesterol (Calc)  Date Value Ref Range Status  06/28/2022 99 mg/dL (calc) Final    Comment:    Reference range: <100 . Desirable range <100 mg/dL for primary prevention;   <70 mg/dL for patients with CHD or diabetic patients  with > or = 2 CHD risk  factors. Marland Kitchen LDL-C is now calculated using the Martin-Hopkins  calculation, which is a validated novel method providing  better accuracy than the Friedewald equation in the  estimation of LDL-C.  Horald Pollen et al. Lenox Ahr. 5284;132(44): 2061-2068  (http://education.[employer].com/faq/FAQ164)    HDL  Date Value Ref Range Status  06/28/2022 68 > OR = 50 mg/dL Final   Triglycerides  Date Value Ref Range Status  06/28/2022 119 <150 mg/dL Final         Passed - Cr in normal range and within 360 days    Creat  Date Value Ref Range Status  06/28/2022 0.78 0.50 - 1.03 mg/dL Final   Creatinine, Urine  Date Value Ref Range Status  02/02/2013 88.7 mg/dL Final         Passed - Last BP in normal range    BP Readings from Last 1 Encounters:  01/31/23 124/74

## 2023-03-26 ENCOUNTER — Other Ambulatory Visit: Payer: Self-pay | Admitting: Internal Medicine

## 2023-03-26 NOTE — Telephone Encounter (Signed)
Medication Refill - Medication: hydrochlorothiazide (HYDRODIURIL) 25 MG tablet , venlafaxine XR (EFFEXOR-XR) 150 MG 24 hr capsule   Pt asked if it was possible to get a courtesy refill to get her through until her appointment 11/14. Please advise.   Has the patient contacted their pharmacy? Yes.    (Agent: If yes, when and what did the pharmacy advise?)  Preferred Pharmacy (with phone number or street name):  [facility] [address], [place] - [address]  [address] [place] [address]  Phone: [phone] Fax: [phone]  Hours: Not open 24 hours   Has the patient been seen for an appointment in the last year OR does the patient have an upcoming appointment? Yes.    Agent: Please be advised that RX refills may take up to 3 business days. We ask that you follow-up with your pharmacy.

## 2023-03-28 MED ORDER — HYDROCHLOROTHIAZIDE 50 MG PO TABS: 50.0000 mg | ORAL_TABLET | Freq: Every day | ORAL | 0 refills | Status: DC

## 2023-03-28 MED ORDER — VENLAFAXINE HCL ER 150 MG PO CP24: 150.0000 mg | ORAL_CAPSULE | Freq: Every day | ORAL | 0 refills | Status: DC

## 2023-03-28 NOTE — Telephone Encounter (Signed)
Requested Prescriptions  Pending Prescriptions Disp Refills   hydrochlorothiazide (HYDRODIURIL) 50 MG tablet 30 tablet 0    Sig: Take 1 tablet (50 mg total) by mouth daily.     Cardiovascular: Diuretics - Thiazide Failed - 03/26/2023  3:03 PM      Failed - Cr in normal range and within 180 days    Creat  Date Value Ref Range Status  06/28/2022 0.78 0.50 - 1.03 mg/dL Final   Creatinine, Urine  Date Value Ref Range Status  02/02/2013 88.7 mg/dL Final         Failed - K in normal range and within 180 days    Potassium  Date Value Ref Range Status  06/28/2022 4.2 3.5 - 5.3 mmol/L Final         Failed - Na in normal range and within 180 days    Sodium  Date Value Ref Range Status  06/28/2022 142 135 - 146 mmol/L Final  04/20/2020 141 134 - 144 mmol/L Final         Failed - Valid encounter within last 6 months    Recent Outpatient Visits           9 months ago Prediabetes   [place] [facility] [place], Salvadore Oxford, NP   1 year ago Encounter for general adult medical examination with abnormal findings   [place] [facility] [place], Salvadore Oxford, NP   1 year ago Skin tags, multiple acquired   [place] [facility] [place], Salvadore Oxford, NP   2 years ago Need for immunization against influenza   [facility] [place], Salvadore Oxford, NP   2 years ago Encounter for general adult medical examination with abnormal findings   [place] [facility] [place], Salvadore Oxford, NP       Future Appointments             In 2 weeks Sampson Si, Salvadore Oxford, NP [facility]   In 3 weeks Glenford Bayley, NP [place] [facility] - Last BP in normal range    BP Readings from Last 1 Encounters:  01/31/23 124/74          venlafaxine XR (EFFEXOR-XR) 150 MG 24 hr capsule 30 capsule 0    Sig: Take 1 capsule (150 mg total) by mouth daily with  breakfast.     Psychiatry: Antidepressants - SNRI - desvenlafaxine & venlafaxine Failed - 03/26/2023  3:03 PM      Failed - Valid encounter within last 6 months    Recent Outpatient Visits           9 months ago Prediabetes   [place] [facility] [place], Salvadore Oxford, NP   1 year ago Encounter for general adult medical examination with abnormal findings   [place] [facility] [place], Salvadore Oxford, NP   1 year ago Skin tags, multiple acquired   [place] [place], Salvadore Oxford, NP   2 years ago Need for immunization against influenza   [place] [facility] [place], Salvadore Oxford, NP   2 years ago Encounter for general adult medical examination with abnormal findings    [facility] [place], Salvadore Oxford, NP       Future Appointments  In 2 weeks Baity, Salvadore Oxford, NP [facility], [place]   In 3 weeks Glenford Bayley, NP [place] [facility]            Failed - Lipid Panel in normal range within the last 12 months    Cholesterol  Date Value Ref Range Status  06/28/2022 189 <200 mg/dL Final   LDL Cholesterol (Calc)  Date Value Ref Range Status  06/28/2022 99 mg/dL (calc) Final    Comment:    Reference range: <100 . Desirable range <100 mg/dL for primary prevention;   <70 mg/dL for patients with CHD or diabetic patients  with > or = 2 CHD risk factors. Marland Kitchen LDL-C is now calculated using the Martin-Hopkins  calculation, which is a validated novel method providing  better accuracy than the Friedewald equation in the  estimation of LDL-C.  Horald Pollen et al. Lenox Ahr. 0981;191(47): 2061-2068  (http://education.[facility].com/faq/FAQ164)    HDL  Date Value Ref Range Status  06/28/2022 68 > OR = 50 mg/dL Final   Triglycerides  Date Value Ref Range Status  06/28/2022 119 <150 mg/dL Final         Passed - Cr in normal range  and within 360 days    Creat  Date Value Ref Range Status  06/28/2022 0.78 0.50 - 1.03 mg/dL Final   Creatinine, Urine  Date Value Ref Range Status  02/02/2013 88.7 mg/dL Final         Passed - Last BP in normal range    BP Readings from Last 1 Encounters:  01/31/23 124/74          Courtesy refill. Patient must keep upcoming appointment for further refill.

## 2023-04-17 ENCOUNTER — Ambulatory Visit (INDEPENDENT_AMBULATORY_CARE_PROVIDER_SITE_OTHER): Payer: 59 | Admitting: Internal Medicine

## 2023-04-17 VITALS — BP 132/80 | HR 93 | Ht 64.0 in | Wt 268.0 lb

## 2023-04-17 DIAGNOSIS — Z0001 Encounter for general adult medical examination with abnormal findings: Secondary | ICD-10-CM

## 2023-04-17 DIAGNOSIS — E66813 Obesity, class 3: Secondary | ICD-10-CM

## 2023-04-17 DIAGNOSIS — Z136 Encounter for screening for cardiovascular disorders: Secondary | ICD-10-CM

## 2023-04-17 DIAGNOSIS — Z6841 Body Mass Index (BMI) 40.0 and over, adult: Secondary | ICD-10-CM

## 2023-04-17 DIAGNOSIS — Z1231 Encounter for screening mammogram for malignant neoplasm of breast: Secondary | ICD-10-CM

## 2023-04-17 DIAGNOSIS — R7303 Prediabetes: Secondary | ICD-10-CM

## 2023-04-17 NOTE — Patient Instructions (Signed)

## 2023-04-17 NOTE — Progress Notes (Signed)
Subjective:    Patient ID: Jody Jensen, female    DOB: May 25, 1971, 52 y.o.   MRN: [id number]  HPI  Patient presents to clinic today for her annual exam.  Flu: 02/2023 Tetanus: 07/2019 COVID: X 2 Shingrix: Never Pap smear: Hysterectomy Mammogram: 01/2022 Colon screening: never Vision screening: annually Dentist: biannually  Diet: She does eat meat. She consumes fruits and veggies. She tries to avoid fried foods. She drinks mostly water. Exercise: None  Review of Systems   Past Medical History:  Diagnosis Date   Anxiety    Diabetes mellitus without complication (HCC)    Hyperlipidemia    Hypertension    Obesity    OSA (obstructive sleep apnea)    Ovarian cyst    Sleep apnea     Current Outpatient Medications  Medication Sig Dispense Refill   albuterol (ACCUNEB) 0.63 MG/3ML nebulizer solution Take 1 ampule by nebulization every 6 (six) hours as needed for wheezing.     albuterol (VENTOLIN HFA) 108 (90 Base) MCG/ACT inhaler Inhale 1-2 puffs into the lungs every 6 (six) hours as needed. 8 g 0   famotidine (PEPCID) 20 MG tablet TAKE 1 TABLET BY MOUTH AT BEDTIME 90 tablet 0   hydrochlorothiazide (HYDRODIURIL) 50 MG tablet Take 1 tablet (50 mg total) by mouth daily. 30 tablet 0   levocetirizine (XYZAL) 5 MG tablet Take 1 tablet (5 mg total) by mouth every morning. 90 tablet 1   montelukast (SINGULAIR) 10 MG tablet Take 1 tablet (10 mg total) by mouth as needed. 90 tablet 1   venlafaxine XR (EFFEXOR-XR) 150 MG 24 hr capsule Take 1 capsule (150 mg total) by mouth daily with breakfast. 30 capsule 0   No current facility-administered medications for this visit.    No Known Allergies  Family History  Problem Relation Age of Onset   Hypertension Mother    Hyperlipidemia Mother    Heart disease Mother    Diabetes Mother    Asthma Mother    Thyroid disease Mother    Lupus Mother    Immunodeficiency Mother    Heart disease Father    Hypertension Father    Cervical  cancer Paternal Grandmother        cervical   Leukemia Paternal Grandmother    Diabetes Paternal Grandfather    Stroke Paternal Grandfather    Diabetes Paternal Aunt    Prostate cancer Paternal Uncle    Heart disease Paternal Uncle     Social History   Socioeconomic History   Marital status: Married    Spouse name: Not on file   Number of children: 2   Years of education: Not on file   Highest education level: Associate degree: occupational, Scientist, product/process development, or vocational program  Occupational History   Not on file  Tobacco Use   Smoking status: Former    Current packs/day: 0.00    Average packs/day: 2.0 packs/day for 12.0 years (24.0 ttl pk-yrs)    Types: Cigarettes    Start date: 02/03/2000    Quit date: 02/03/2012    Years since quitting: 11.2   Smokeless tobacco: Never  Vaping Use   Vaping status: Former   Devices: quit in july 2022  Substance and Sexual Activity   Alcohol use: Yes    Comment: rare   Drug use: No   Sexual activity: Yes    Birth control/protection: Surgical  Other Topics Concern   Not on file  Social History Narrative   Not on file   Social  Determinants of Health   Financial Resource Strain: Low Risk  (04/17/2023)   Overall Financial Resource Strain (CARDIA)    Difficulty of Paying Living Expenses: Not very hard  Food Insecurity: No Food Insecurity (04/17/2023)   Hunger Vital Sign    Worried About Running Out of Food in the Last Year: Never true    Ran Out of Food in the Last Year: Never true  Transportation Needs: No Transportation Needs (04/17/2023)   PRAPARE - Administrator, Civil Service (Medical): No    Lack of Transportation (Non-Medical): No  Physical Activity: Unknown (04/17/2023)   Exercise Vital Sign    Days of Exercise per Week: 2 days    Minutes of Exercise per Session: Patient declined  Stress: No Stress Concern Present (04/17/2023)   [employer] of Occupational Health - Occupational Stress Questionnaire    Feeling  of Stress : Only a little  Social Connections: Socially Integrated (04/17/2023)   Social Connection and Isolation Panel [NHANES]    Frequency of Communication with Friends and Family: Three times a week    Frequency of Social Gatherings with Friends and Family: Once a week    Attends Religious Services: More than 4 times per year    Active Member of [employer] or Organizations: Yes    Attends Engineer, structural: More than 4 times per year    Marital Status: Married  Catering manager Violence: Not on file     Constitutional: Denies fever, malaise, fatigue, headache or abrupt weight changes.  HEENT: Denies eye pain, eye redness, ear pain, ringing in the ears, wax buildup, runny nose, nasal congestion, bloody nose, or sore throat. Respiratory: Denies difficulty breathing, shortness of breath, cough or sputum production.   Cardiovascular: Denies chest pain, chest tightness, palpitations or swelling in the hands or feet.  Gastrointestinal: Denies abdominal pain, bloating, constipation, diarrhea or blood in the stool.  GU: Denies urgency, frequency, pain with urination, burning sensation, blood in urine, odor or discharge. Musculoskeletal: Denies decrease in range of motion, difficulty with gait, muscle pain or joint pain and swelling.  Skin: Denies redness, rashes, lesions or ulcercations.  Neurological: Denies dizziness, difficulty with memory, difficulty with speech or problems with balance and coordination.  Psych: Patient has a history of anxiety.  Denies depression, SI/HI.  No other specific complaints in a complete review of systems (except as listed in HPI above).      Objective:   Physical Exam  BP 132/80   Pulse 93   Ht 5\' 4"  (1.626 m)   Wt 268 lb (121.6 kg)   LMP 11/02/1997   SpO2 97%   BMI 46.00 kg/m   Wt Readings from Last 3 Encounters:  01/31/23 263 lb 12.8 oz (119.7 kg)  06/28/22 281 lb (127.5 kg)  11/09/21 257 lb (116.6 kg)    General: Appears her stated  age, obese, in NAD. Skin: Warm, dry and intact.  HEENT: Head: normal shape and size; Eyes: sclera white, no icterus, conjunctiva pink, PERRLA and EOMs intact;  Neck:  Neck supple, trachea midline. No masses, lumps present.  Thyromegaly noted. Cardiovascular: Normal rate and rhythm. S1,S2 noted.  No murmur, rubs or gallops noted. No JVD or BLE edema. No carotid bruits noted. Pulmonary/Chest: Normal effort and positive vesicular breath sounds. No respiratory distress. No wheezes, rales or ronchi noted.  Abdomen: Soft and nontender. Normal bowel sounds.  Musculoskeletal: Strength 5/5 BUE/BLE.  No difficulty with gait.  Neurological: Alert and oriented. Cranial nerves II-XII grossly  intact. Coordination normal.  Psychiatric: Mood and affect normal. Behavior is normal. Judgment and thought content normal.    BMET    Component Value Date/Time   NA 142 06/28/2022 0936   NA 141 04/20/2020 0957   K 4.2 06/28/2022 0936   CL 102 06/28/2022 0936   CO2 31 06/28/2022 0936   GLUCOSE 116 (H) 06/28/2022 0936   BUN 14 06/28/2022 0936   BUN 11 04/20/2020 0957   CREATININE 0.78 06/28/2022 0936   CALCIUM 8.9 06/28/2022 0936   GFRNONAA >60 12/29/2020 1924   GFRAA 113 04/20/2020 0957    Lipid Panel     Component Value Date/Time   CHOL 189 06/28/2022 0936   TRIG 119 06/28/2022 0936   HDL 68 06/28/2022 0936   CHOLHDL 2.8 06/28/2022 0936   VLDL 20.6 09/01/2015 0815   LDLCALC 99 06/28/2022 0936    CBC    Component Value Date/Time   WBC 4.7 06/28/2022 0936   RBC 4.84 06/28/2022 0936   HGB 14.1 06/28/2022 0936   HCT 42.2 06/28/2022 0936   PLT 185 06/28/2022 0936   MCV 87.2 06/28/2022 0936   MCH 29.1 06/28/2022 0936   MCHC 33.4 06/28/2022 0936   RDW 13.9 06/28/2022 0936   LYMPHSABS 2.7 12/02/2012 1521   MONOABS 0.5 12/02/2012 1521   EOSABS 0.2 12/02/2012 1521   BASOSABS 0.0 12/02/2012 1521    Hgb A1C Lab Results  Component Value Date   HGBA1C 6.1 (H) 06/28/2022             Assessment & Plan:  Preventative health maintenance:  Flu shot UTD Tetanus UTD Encouraged her to get her COVID booster Discussed Shingrix vaccine, she will check coverage with her insurance company and schedule visit if she would like to have this done She no longer needs to screen for cervical cancer Mammogram ordered-she will call to schedule Colon screening declined at this time Encouraged her to consume a balanced diet and exercise regimen Advised her to see an eye doctor and dentist annually Will check CBC, c-Met, lipid, A1c today  RTC in 6 months, follow-up chronic conditions Nicki Reaper, NP

## 2023-04-17 NOTE — Assessment & Plan Note (Signed)
Encouraged diet and exercise for weight loss ?

## 2023-04-18 ENCOUNTER — Telehealth (INDEPENDENT_AMBULATORY_CARE_PROVIDER_SITE_OTHER): Payer: 59 | Admitting: Primary Care

## 2023-04-18 DIAGNOSIS — G4733 Obstructive sleep apnea (adult) (pediatric): Secondary | ICD-10-CM | POA: Diagnosis not present

## 2023-04-18 DIAGNOSIS — J45909 Unspecified asthma, uncomplicated: Secondary | ICD-10-CM | POA: Diagnosis not present

## 2023-04-18 DIAGNOSIS — Z87891 Personal history of nicotine dependence: Secondary | ICD-10-CM

## 2023-04-18 LAB — COMPLETE METABOLIC PANEL WITH GFR
AG Ratio: 2 (calc) (ref 1.0–2.5)
ALT: 33 U/L — ABNORMAL HIGH (ref 6–29)
AST: 21 U/L (ref 10–35)
Albumin: 4.2 g/dL (ref 3.6–5.1)
Alkaline phosphatase (APISO): 96 U/L (ref 37–153)
BUN: 16 mg/dL (ref 7–25)
CO2: 34 mmol/L — ABNORMAL HIGH (ref 20–32)
Calcium: 9.3 mg/dL (ref 8.6–10.4)
Chloride: 99 mmol/L (ref 98–110)
Creat: 0.78 mg/dL (ref 0.50–1.03)
Globulin: 2.1 g/dL (ref 1.9–3.7)
Glucose, Bld: 96 mg/dL (ref 65–139)
Potassium: 3.5 mmol/L (ref 3.5–5.3)
Sodium: 140 mmol/L (ref 135–146)
Total Bilirubin: 0.3 mg/dL (ref 0.2–1.2)
Total Protein: 6.3 g/dL (ref 6.1–8.1)
eGFR: 92 mL/min/{1.73_m2} (ref >=60)

## 2023-04-18 LAB — LIPID PANEL
Cholesterol: 185 mg/dL (ref <200)
HDL: 63 mg/dL (ref >=50)
LDL Cholesterol (Calc): 100 mg/dL — ABNORMAL HIGH
Non-HDL Cholesterol (Calc): 122 mg/dL (ref <130)
Total CHOL/HDL Ratio: 2.9 (calc) (ref <5.0)
Triglycerides: 127 mg/dL (ref <150)

## 2023-04-18 LAB — CBC
HCT: 43.3 % (ref 35.0–45.0)
Hemoglobin: 14.2 g/dL (ref 11.7–15.5)
MCH: 29 pg (ref 27.0–33.0)
MCHC: 32.8 g/dL (ref 32.0–36.0)
MCV: 88.4 fL (ref 80.0–100.0)
MPV: 10.7 fL (ref 7.5–12.5)
Platelets: 222 10*3/uL (ref 140–400)
RBC: 4.9 10*6/uL (ref 3.80–5.10)
RDW: 13.4 % (ref 11.0–15.0)
WBC: 7.6 10*3/uL (ref 3.8–10.8)

## 2023-04-18 LAB — HEMOGLOBIN A1C
Hgb A1c MFr Bld: 5.7 %{Hb} — ABNORMAL HIGH (ref <5.7)
Mean Plasma Glucose: 117 mg/dL
eAG (mmol/L): 6.5 mmol/L

## 2023-04-18 NOTE — Progress Notes (Signed)
Virtual Visit via Video Note  I connected with Jody Jensen on 04/18/23 at  3:00 PM EST by a video enabled telemedicine application and verified that I am speaking with the correct person using two identifiers.  Location: Patient: Home Provider: Office    I discussed the limitations of evaluation and management by telemedicine and the availability of in person appointments. The patient expressed understanding and agreed to proceed.  History of Present Illness: 52 year old female, former smoker.  Past medical history significant of hypertension, asthma, OSA, GERD, fatty liver, thyroid nodule, osteoarthritis, anxiety, prediabetes, obesity. Patient of Dr. Belia Heman   Previous [facility] pulmonary encounter: 01/31/23- Dr. Belia Heman  Regarding OSA Severe OSA AHI of 77 Started all device 1 year ago However at this time seems like the oral device is not helping anymore Patient feels more tired less energy More fatigue Morning headaches Patient will need to start CPAP therapy as soon as possible Patient stopped smoking and vaping  No longer using inhalers  Current weight 263 Weighed 296 in 01/2021  04/18/2023- Interim hx  Patient presents today for CPAP compliance. Last seen by Dr. Belia Heman in August. Received CPAP machine in August/September. She is compliant with use and reports having more energy and not feeling as tired. No issues with mask fit or pressure settings. Current pressure 5 to 12 cm H2O. Using nasal pillow mask.   Airview download 03/18/2023 - 04/16/2023 Usage days 29/30 days (97%) greater than 4 hours Average usage 6 hours 16 minutes Pressure 5 to 12 cm H2O (9.2 cm H2O-95%) Air leaks 5.9 L/min (95%) AHI 0.3   Observations/Objective:  Appears well; no overt respiratory symptoms   Assessment and Plan:  OSA: - Stable; Patient is 97% compliant with CPAP and reports benefit from use - Current pressure 5-12cm h20; Residual AHI 0.3 - No changes, advised patient continue to wear CPAP  nightly 4-6 hours or longer   Asthma: - Symptoms are well-controlled with avoidance of smoking and vaping.  Patient is no longer using inhalers. Still taking Singulair. Has not needed to use albuterol  Follow Up Instructions:  - FU in 6 months   I discussed the assessment and treatment plan with the patient. The patient was provided an opportunity to ask questions and all were answered. The patient agreed with the plan and demonstrated an understanding of the instructions.   The patient was advised to call back or seek an in-person evaluation if the symptoms worsen or if the condition fails to improve as anticipated.  I provided 22 minutes of non-face-to-face time during this encounter.   Glenford Bayley, NP

## 2023-04-18 NOTE — Patient Instructions (Signed)
No changes Continue to wear CPAP nightly FU in 6 months with Dr. Belia Heman

## 2023-04-20 ENCOUNTER — Other Ambulatory Visit: Payer: Self-pay | Admitting: Internal Medicine

## 2023-04-21 NOTE — Telephone Encounter (Signed)
Requested medication (s) are due for refill today: yes   Requested medication (s) are on the active medication list: yes   Last refill:  pepcid 01/22/23 #90 0 refills , effexor 03/28/23 #30 0 refills   Future visit scheduled: yes in 5 months   Notes to clinic:  no refills remain. Do you want to allow #90 1 refills for both Rx?     Requested Prescriptions  Pending Prescriptions Disp Refills   famotidine (PEPCID) 20 MG tablet [Pharmacy Med Name: Famotidine 20 MG Oral Tablet] 90 tablet 0    Sig: TAKE 1 TABLET BY MOUTH AT BEDTIME     Gastroenterology:  H2 Antagonists Passed - 04/20/2023  7:01 PM      Passed - Valid encounter within last 12 months    Recent Outpatient Visits           4 days ago Encounter for general adult medical examination with abnormal findings   [place] [place], Salvadore Oxford, NP   9 months ago Prediabetes   [place] [facility] [place], Salvadore Oxford, NP   1 year ago Encounter for general adult medical examination with abnormal findings   [place] [facility] [place], Salvadore Oxford, NP   1 year ago Skin tags, multiple acquired   [place] [facility] [place], Salvadore Oxford, NP   2 years ago Need for immunization against influenza   [place] [facility] [place], Salvadore Oxford, NP       Future Appointments             In 5 months Sampson Si, Salvadore Oxford, NP [place] [facility]             venlafaxine XR (EFFEXOR-XR) 150 MG 24 hr capsule [Pharmacy Med Name: Venlafaxine HCl ER 150 MG Oral Capsule Extended Release 24 Hour] 30 capsule 0    Sig: TAKE 1 CAPSULE BY MOUTH ONCE DAILY WITH BREAKFAST (COURTESY  REFILL,  MUST  KEEP  UPCOMING  APPOINTMENT  FOR  FURTHER  REFILL)     Psychiatry: Antidepressants - SNRI - desvenlafaxine & venlafaxine Failed - 04/20/2023  7:01 PM      Failed - Lipid Panel in normal range within the last 12 months    Cholesterol  Date Value Ref  Range Status  04/17/2023 185 <200 mg/dL Final   LDL Cholesterol (Calc)  Date Value Ref Range Status  04/17/2023 100 (H) mg/dL (calc) Final    Comment:    Reference range: <100 . Desirable range <100 mg/dL for primary prevention;   <70 mg/dL for patients with CHD or diabetic patients  with > or = 2 CHD risk factors. Marland Kitchen LDL-C is now calculated using the Martin-Hopkins  calculation, which is a validated novel method providing  better accuracy than the Friedewald equation in the  estimation of LDL-C.  Horald Pollen et al. Lenox Ahr. 1610;960(45): 2061-2068  (http://education.[facility].com/faq/FAQ164)    HDL  Date Value Ref Range Status  04/17/2023 63 > OR = 50 mg/dL Final   Triglycerides  Date Value Ref Range Status  04/17/2023 127 <150 mg/dL Final         Passed - Cr in normal range and within 360 days    Creat  Date Value Ref Range Status  04/17/2023 0.78 0.50 - 1.03 mg/dL Final   Creatinine, Urine  Date Value Ref Range Status  02/02/2013 88.7 mg/dL Final  Passed - Last BP in normal range    BP Readings from Last 1 Encounters:  04/17/23 132/80         Passed - Valid encounter within last 6 months    Recent Outpatient Visits           4 days ago Encounter for general adult medical examination with abnormal findings   [place] [facility] [place], Salvadore Oxford, NP   9 months ago Prediabetes   [place] [facility] [place], Salvadore Oxford, NP   1 year ago Encounter for general adult medical examination with abnormal findings   [place] [facility] [place], Salvadore Oxford, NP   1 year ago Skin tags, multiple acquired   [place] [facility] [place], Salvadore Oxford, NP   2 years ago Need for immunization against influenza   [place] [place], Salvadore Oxford, NP       Future Appointments             In 5 months Baity, Salvadore Oxford, NP [place] [facility]             Refused Prescriptions Disp Refills   hydrochlorothiazide (HYDRODIURIL) 25 MG tablet [Pharmacy Med Name: hydroCHLOROthiazide 25 MG Oral Tablet] 90 tablet 0    Sig: Take 1 tablet by mouth once daily     Cardiovascular: Diuretics - Thiazide Passed - 04/20/2023  7:01 PM      Passed - Cr in normal range and within 180 days    Creat  Date Value Ref Range Status  04/17/2023 0.78 0.50 - 1.03 mg/dL Final   Creatinine, Urine  Date Value Ref Range Status  02/02/2013 88.7 mg/dL Final         Passed - K in normal range and within 180 days    Potassium  Date Value Ref Range Status  04/17/2023 3.5 3.5 - 5.3 mmol/L Final         Passed - Na in normal range and within 180 days    Sodium  Date Value Ref Range Status  04/17/2023 140 135 - 146 mmol/L Final  04/20/2020 141 134 - 144 mmol/L Final         Passed - Last BP in normal range    BP Readings from Last 1 Encounters:  04/17/23 132/80         Passed - Valid encounter within last 6 months    Recent Outpatient Visits           4 days ago Encounter for general adult medical examination with abnormal findings   [place] [facility] [place], Salvadore Oxford, NP   9 months ago Prediabetes   [place] [facility] [place], Salvadore Oxford, NP   1 year ago Encounter for general adult medical examination with abnormal findings   [place] [facility] [place], Salvadore Oxford, NP   1 year ago Skin tags, multiple acquired   [place] [facility] [place], Salvadore Oxford, NP   2 years ago Need for immunization against influenza   [facility] [place], Salvadore Oxford, NP       Future Appointments             In 5 months Baity, Salvadore Oxford, NP [place] [facility]

## 2023-04-21 NOTE — Telephone Encounter (Signed)
Requested by interface surescripts. Medication discontinued 06/28/22.  Requested Prescriptions  Pending Prescriptions Disp Refills   famotidine (PEPCID) 20 MG tablet [Pharmacy Med Name: Famotidine 20 MG Oral Tablet] 90 tablet 0    Sig: TAKE 1 TABLET BY MOUTH AT BEDTIME     Gastroenterology:  H2 Antagonists Passed - 04/20/2023  7:01 PM      Passed - Valid encounter within last 12 months    Recent Outpatient Visits           4 days ago Encounter for general adult medical examination with abnormal findings   [facility] [place], Salvadore Oxford, NP   9 months ago Prediabetes   [place] [facility] [place], Salvadore Oxford, NP   1 year ago Encounter for general adult medical examination with abnormal findings   [facility] [place], Salvadore Oxford, NP   1 year ago Skin tags, multiple acquired   [place] [facility] [place], Salvadore Oxford, NP   2 years ago Need for immunization against influenza   [facility] [place], Salvadore Oxford, NP       Future Appointments             In 5 months [place], Salvadore Oxford, NP [facility]             venlafaxine XR (EFFEXOR-XR) 150 MG 24 hr capsule [Pharmacy Med Name: Venlafaxine HCl ER 150 MG Oral Capsule Extended Release 24 Hour] 30 capsule 0    Sig: TAKE 1 CAPSULE BY MOUTH ONCE DAILY WITH BREAKFAST (COURTESY  REFILL,  MUST  KEEP  UPCOMING  APPOINTMENT  FOR  FURTHER  REFILL)     Psychiatry: Antidepressants - SNRI - desvenlafaxine & venlafaxine Failed - 04/20/2023  7:01 PM      Failed - Lipid Panel in normal range within the last 12 months    Cholesterol  Date Value Ref Range Status  04/17/2023 185 <200 mg/dL Final   LDL Cholesterol (Calc)  Date Value Ref Range Status  04/17/2023 100 (H) mg/dL (calc) Final    Comment:    Reference range: <100 . Desirable range <100 mg/dL for primary prevention;   <70 mg/dL for patients with CHD or  diabetic patients  with > or = 2 CHD risk factors. Marland Kitchen LDL-C is now calculated using the Martin-Hopkins  calculation, which is a validated novel method providing  better accuracy than the Friedewald equation in the  estimation of LDL-C.  Horald Pollen et al. Lenox Ahr. 1610;960(45): 2061-2068  (http://education.[employer].com/faq/FAQ164)    HDL  Date Value Ref Range Status  04/17/2023 63 > OR = 50 mg/dL Final   Triglycerides  Date Value Ref Range Status  04/17/2023 127 <150 mg/dL Final         Passed - Cr in normal range and within 360 days    Creat  Date Value Ref Range Status  04/17/2023 0.78 0.50 - 1.03 mg/dL Final   Creatinine, Urine  Date Value Ref Range Status  02/02/2013 88.7 mg/dL Final         Passed - Last BP in normal range    BP Readings from Last 1 Encounters:  04/17/23 132/80         Passed - Valid encounter within last 6 months    Recent Outpatient Visits           4 days ago Encounter for general adult medical examination  with abnormal findings   [place] [facility] [place], Salvadore Oxford, NP   9 months ago Prediabetes   [place] [facility] [place], Salvadore Oxford, NP   1 year ago Encounter for general adult medical examination with abnormal findings   [place] [facility] [place], Salvadore Oxford, NP   1 year ago Skin tags, multiple acquired   [place] [facility] [place], Salvadore Oxford, NP   2 years ago Need for immunization against influenza   [place] [facility] [place], Salvadore Oxford, NP       Future Appointments             In 5 months [place], Salvadore Oxford, NP [facility]            Refused Prescriptions Disp Refills   hydrochlorothiazide (HYDRODIURIL) 25 MG tablet [Pharmacy Med Name: hydroCHLOROthiazide 25 MG Oral Tablet] 90 tablet 0    Sig: Take 1 tablet by mouth once daily     Cardiovascular: Diuretics - Thiazide Passed - 04/20/2023  7:01 PM       Passed - Cr in normal range and within 180 days    Creat  Date Value Ref Range Status  04/17/2023 0.78 0.50 - 1.03 mg/dL Final   Creatinine, Urine  Date Value Ref Range Status  02/02/2013 88.7 mg/dL Final         Passed - K in normal range and within 180 days    Potassium  Date Value Ref Range Status  04/17/2023 3.5 3.5 - 5.3 mmol/L Final         Passed - Na in normal range and within 180 days    Sodium  Date Value Ref Range Status  04/17/2023 140 135 - 146 mmol/L Final  04/20/2020 141 134 - 144 mmol/L Final         Passed - Last BP in normal range    BP Readings from Last 1 Encounters:  04/17/23 132/80         Passed - Valid encounter within last 6 months    Recent Outpatient Visits           4 days ago Encounter for general adult medical examination with abnormal findings   [facility] [place], Salvadore Oxford, NP   9 months ago Prediabetes   [place] [place], Salvadore Oxford, NP   1 year ago Encounter for general adult medical examination with abnormal findings   [place] [facility] [place], Salvadore Oxford, NP   1 year ago Skin tags, multiple acquired   [place] [facility] [place], Salvadore Oxford, NP   2 years ago Need for immunization against influenza   [facility] [place], Salvadore Oxford, NP       Future Appointments             In 5 months Baity, Salvadore Oxford, NP  [facility]

## 2023-04-25 ENCOUNTER — Other Ambulatory Visit: Payer: Self-pay | Admitting: Nurse Practitioner

## 2023-04-25 DIAGNOSIS — E041 Nontoxic single thyroid nodule: Secondary | ICD-10-CM

## 2023-05-21 ENCOUNTER — Encounter: Payer: Self-pay | Admitting: Internal Medicine

## 2023-05-21 ENCOUNTER — Ambulatory Visit
Admission: RE | Admit: 2023-05-21 | Discharge: 2023-05-21 | Disposition: A | Discharge status: Home / Self Care | Payer: 59 | Source: Ambulatory Visit | Attending: Nurse Practitioner | Admitting: Nurse Practitioner

## 2023-05-21 ENCOUNTER — Ambulatory Visit: Payer: Self-pay

## 2023-05-21 DIAGNOSIS — E041 Nontoxic single thyroid nodule: Secondary | ICD-10-CM

## 2023-05-21 NOTE — Telephone Encounter (Signed)
I recommend a virtual visit to discuss.  If there are no appointments tomorrow, I can work her in at [phone] or [phone]

## 2023-05-21 NOTE — Telephone Encounter (Signed)
    Chief Complaint: Asking for medication dose to be increased. Declines OV.  Having increased anxiety, "more so at work." Has difficulty getting off work for [employer]. Can do VV if needed. Symptoms: Anxiety, crying easily. Frequency: 3 weeks Pertinent Negatives: Patient denies any thoughts of self harm. Disposition: [] ED /[] Urgent Care (no appt availability in office) / [] Appointment(In office/virtual)/ []  [place] [facility]/ [] Home Care/ [x] Refused Recommended Disposition /[] [place] [facility]/ [x]  Follow-up with PCP Additional Notes: Please advise pt.  Reason for Disposition  MODERATE anxiety (e.g., persistent or frequent anxiety symptoms; interferes with sleep, school, or work)  Answer Assessment - Initial Assessment Questions 1. CONCERN: "Did anything happen that prompted you to call today?"      Anxiety 2. ANXIETY SYMPTOMS: "Can you describe how you (your loved one; patient) have been feeling?" (e.g., tense, restless, panicky, anxious, keyed up, overwhelmed, sense of impending doom).      Crying 3. ONSET: "How long have you been feeling this way?" (e.g., hours, days, weeks)     3 weeks 4. SEVERITY: "How would you rate the level of anxiety?" (e.g., 0 - 10; or mild, moderate, severe).     Moderate 5. FUNCTIONAL IMPAIRMENT: "How have these feelings affected your ability to do daily activities?" "Have you had more difficulty than usual doing your normal daily activities?" (e.g., getting better, same, worse; self-care, school, work, interactions)     Affecting work 6. HISTORY: "Have you felt this way before?" "Have you ever been diagnosed with an anxiety problem in the past?" (e.g., generalized anxiety disorder, panic attacks, PTSD). If Yes, ask: "How was this problem treated?" (e.g., medicines, counseling, etc.)     Medications 7. RISK OF HARM - SUICIDAL IDEATION: "Do you ever have thoughts of hurting or killing yourself?" If Yes, ask:  "Do you have these feelings now?" "Do you have a  plan on how you would do this?"     No 8. TREATMENT:  "What has been done so far to treat this anxiety?" (e.g., medicines, relaxation strategies). "What has helped?"     Medication 9. TREATMENT - THERAPIST: "Do you have a counselor or therapist? Name?"     No 10. POTENTIAL TRIGGERS: "Do you drink caffeinated beverages (e.g., coffee, colas, teas), and how much daily?" "Do you drink alcohol or use any drugs?" "Have you started any new medicines recently?"       No 11. PATIENT SUPPORT: "Who is with you now?" "Who do you live with?" "Do you have family or friends who you can talk to?"        Family 12. OTHER SYMPTOMS: "Do you have any other symptoms?" (e.g., feeling depressed, trouble concentrating, trouble sleeping, trouble breathing, palpitations or fast heartbeat, chest pain, sweating, nausea, or diarrhea)       "I have a lot going on." 13. PREGNANCY: "Is there any chance you are pregnant?" "When was your last menstrual period?"       no  Protocols used: Anxiety and Panic Attack-A-AH

## 2023-05-22 ENCOUNTER — Encounter: Payer: Self-pay | Admitting: Internal Medicine

## 2023-05-22 ENCOUNTER — Telehealth: Payer: 59 | Admitting: Internal Medicine

## 2023-05-22 DIAGNOSIS — F419 Anxiety disorder, unspecified: Secondary | ICD-10-CM | POA: Diagnosis not present

## 2023-05-22 MED ORDER — VENLAFAXINE HCL ER 75 MG PO CP24: 75.0000 mg | ORAL_CAPSULE | Freq: Every day | ORAL | 0 refills | Status: DC

## 2023-05-22 NOTE — Progress Notes (Signed)
Virtual Visit via Video Note  I connected with Jody Jensen on 05/22/23 at 11:20 AM EST by a video enabled telemedicine application and verified that I am speaking with the correct person using two identifiers.  Location: Patient: Work Provider: Office  Persons participating in this video call: Nicki Reaper, NP and Jaylene Switala   I discussed the limitations of evaluation and management by telemedicine and the availability of in person appointments. The patient expressed understanding and agreed to proceed.  History of Present Illness:   Discussed the use of AI scribe software for clinical note transcription with the patient, who gave verbal consent to proceed.  The patient, currently on Venlafaxine 150mg , has been experiencing an increase in anxiety symptoms. She reports heightened sensitivity, crying easily, and an overall increase in anxiety. The duration and potential triggers of this heightened anxiety are unclear to the patient. She has been on the current dose of Venlafaxine for a significant period and has not previously taken any other medications for her condition.  She is not currently seeing a therapist.  She denies depression, SI/HI.         Past Medical History:  Diagnosis Date   Allergy    As a child   Anxiety    Arthritis    In knees   Asthma    Diabetes mellitus without complication (HCC)    Hyperlipidemia    Hypertension    Obesity    OSA (obstructive sleep apnea)    Ovarian cyst    Sleep apnea     Current Outpatient Medications  Medication Sig Dispense Refill   albuterol (ACCUNEB) 0.63 MG/3ML nebulizer solution Take 1 ampule by nebulization every 6 (six) hours as needed for wheezing.     albuterol (VENTOLIN HFA) 108 (90 Base) MCG/ACT inhaler Inhale 1-2 puffs into the lungs every 6 (six) hours as needed. 8 g 0   famotidine (PEPCID) 20 MG tablet TAKE 1 TABLET BY MOUTH AT BEDTIME 90 tablet 1   hydrochlorothiazide (HYDRODIURIL) 50 MG tablet Take 1 tablet  (50 mg total) by mouth daily. 30 tablet 0   levocetirizine (XYZAL) 5 MG tablet Take 1 tablet (5 mg total) by mouth every morning. 90 tablet 1   montelukast (SINGULAIR) 10 MG tablet Take 1 tablet (10 mg total) by mouth as needed. 90 tablet 1   phentermine 37.5 MG capsule Take 37.5 mg by mouth daily.     venlafaxine XR (EFFEXOR-XR) 150 MG 24 hr capsule TAKE 1 CAPSULE BY MOUTH ONCE DAILY WITH BREAKFAST (COURTESY  REFILL,  MUST  KEEP  UPCOMING  APPOINTMENT  FOR  FURTHER  REFILL) 90 capsule 1   Vitamin D, Ergocalciferol, (DRISDOL) 1.25 MG (50000 UNIT) CAPS capsule Take 50,000 Units by mouth once a week.     No current facility-administered medications for this visit.    No Known Allergies  Family History  Problem Relation Age of Onset   Hypertension Mother    Hyperlipidemia Mother    Heart disease Mother    Diabetes Mother    Asthma Mother    Thyroid disease Mother    Lupus Mother    Immunodeficiency Mother    Arthritis Mother    Heart disease Father    Hypertension Father    Cervical cancer Paternal Grandmother        cervical   Leukemia Paternal Grandmother    Diabetes Paternal Grandfather    Stroke Paternal Grandfather    Diabetes Paternal Aunt    Prostate cancer Paternal Uncle  Heart disease Paternal Uncle     Social History   Socioeconomic History   Marital status: Married    Spouse name: Not on file   Number of children: 2   Years of education: Not on file   Highest education level: Associate degree: occupational, Scientist, product/process development, or vocational program  Occupational History   Not on file  Tobacco Use   Smoking status: Former    Current packs/day: 0.00    Average packs/day: 2.0 packs/day for 12.0 years (24.0 ttl pk-yrs)    Types: Cigarettes, E-cigarettes    Start date: 02/03/2000    Quit date: 02/03/2012    Years since quitting: 11.3   Smokeless tobacco: Never  Vaping Use   Vaping status: Former   Devices: quit in july 2022  Substance and Sexual Activity   Alcohol  use: Yes    Comment: rare   Drug use: No   Sexual activity: Yes    Birth control/protection: Surgical  Other Topics Concern   Not on file  Social History Narrative   Not on file   Social Drivers of Health   Financial Resource Strain: Low Risk  (04/17/2023)   Overall Financial Resource Strain (CARDIA)    Difficulty of Paying Living Expenses: Not very hard  Food Insecurity: No Food Insecurity (04/17/2023)   Hunger Vital Sign    Worried About Running Out of Food in the Last Year: Never true    Ran Out of Food in the Last Year: Never true  Transportation Needs: No Transportation Needs (04/17/2023)   PRAPARE - Administrator, Civil Service (Medical): No    Lack of Transportation (Non-Medical): No  Physical Activity: Unknown (04/17/2023)   Exercise Vital Sign    Days of Exercise per Week: 2 days    Minutes of Exercise per Session: Patient declined  Stress: No Stress Concern Present (04/17/2023)   [employer] of Occupational Health - Occupational Stress Questionnaire    Feeling of Stress : Only a little  Social Connections: Socially Integrated (04/17/2023)   Social Connection and Isolation Panel [NHANES]    Frequency of Communication with Friends and Family: Three times a week    Frequency of Social Gatherings with Friends and Family: Once a week    Attends Religious Services: More than 4 times per year    Active Member of [employer] or Organizations: Yes    Attends Engineer, structural: More than 4 times per year    Marital Status: Married  Catering manager Violence: Not on file     Constitutional: Denies fever, malaise, fatigue, headache or abrupt weight changes.  HEENT: Denies eye pain, eye redness, ear pain, ringing in the ears, wax buildup, runny nose, nasal congestion, bloody nose, or sore throat. Respiratory: Denies difficulty breathing, shortness of breath, cough or sputum production.   Cardiovascular: Denies chest pain, chest tightness, palpitations  or swelling in the hands or feet.  Gastrointestinal: Denies abdominal pain, bloating, constipation, diarrhea or blood in the stool.  GU: Denies urgency, frequency, pain with urination, burning sensation, blood in urine, odor or discharge. Musculoskeletal: Patient reports joint pain.  Denies decrease in range of motion, difficulty with gait, muscle pain or joint swelling.  Skin: Denies redness, rashes, lesions or ulcercations.  Neurological: Denies dizziness, difficulty with memory, difficulty with speech or problems with balance and coordination.  Psych: Patient has a history of anxiety.  Denies depression, SI/HI.  No other specific complaints in a complete review of systems (except as listed in HPI  above).    Observations/Objective: LMP 11/02/1997  Wt Readings from Last 3 Encounters:  04/17/23 268 lb (121.6 kg)  01/31/23 263 lb 12.8 oz (119.7 kg)  06/28/22 281 lb (127.5 kg)    General: Appears her stated age, well developed, well nourished in NAD. Pulmonary/Chest: Normal effort. No respiratory distress.  Neurological: Alert and oriented.  Psychiatric: Mood and affect normal.  Mildly anxious appearing. Judgment and thought content normal.     BMET    Component Value Date/Time   NA 140 04/17/2023 1545   NA 141 04/20/2020 0957   K 3.5 04/17/2023 1545   CL 99 04/17/2023 1545   CO2 34 (H) 04/17/2023 1545   GLUCOSE 96 04/17/2023 1545   BUN 16 04/17/2023 1545   BUN 11 04/20/2020 0957   CREATININE 0.78 04/17/2023 1545   CALCIUM 9.3 04/17/2023 1545   GFRNONAA >60 12/29/2020 1924   GFRAA 113 04/20/2020 0957    Lipid Panel     Component Value Date/Time   CHOL 185 04/17/2023 1545   TRIG 127 04/17/2023 1545   HDL 63 04/17/2023 1545   CHOLHDL 2.9 04/17/2023 1545   VLDL 20.6 09/01/2015 0815   LDLCALC 100 (H) 04/17/2023 1545    CBC    Component Value Date/Time   WBC 7.6 04/17/2023 1545   RBC 4.90 04/17/2023 1545   HGB 14.2 04/17/2023 1545   HCT 43.3 04/17/2023 1545   PLT  222 04/17/2023 1545   MCV 88.4 04/17/2023 1545   MCH 29.0 04/17/2023 1545   MCHC 32.8 04/17/2023 1545   RDW 13.4 04/17/2023 1545   LYMPHSABS 2.7 12/02/2012 1521   MONOABS 0.5 12/02/2012 1521   EOSABS 0.2 12/02/2012 1521   BASOSABS 0.0 12/02/2012 1521    Hgb A1C Lab Results  Component Value Date   HGBA1C 5.7 (H) 04/17/2023        Assessment and Plan:  Assessment and Plan    Anxiety Increased anxiety symptoms despite stable dose of Venlafaxine 150mg . Discussed options of increasing Venlafaxine to max dose or adding Buspirone for situational anxiety. -Increase Venlafaxine to 225mg  daily. -Review in 3-4 weeks to assess response to increased dose. If inadequate response, consider adding Buspirone.       RTC in 5 months for follow-up of chronic conditions  Follow Up Instructions:    I discussed the assessment and treatment plan with the patient. The patient was provided an opportunity to ask questions and all were answered. The patient agreed with the plan and demonstrated an understanding of the instructions.   The patient was advised to call back or seek an in-person evaluation if the symptoms worsen or if the condition fails to improve as anticipated.    Nicki Reaper, NP

## 2023-05-22 NOTE — Patient Instructions (Signed)

## 2023-05-26 ENCOUNTER — Encounter: Payer: Self-pay | Admitting: Internal Medicine

## 2023-05-26 NOTE — Telephone Encounter (Signed)
Dr. Kasa, please advise. Thanks °

## 2023-06-24 ENCOUNTER — Other Ambulatory Visit: Payer: Self-pay | Admitting: Internal Medicine

## 2023-06-24 NOTE — Telephone Encounter (Signed)
Requested Prescriptions  Pending Prescriptions Disp Refills   hydrochlorothiazide (HYDRODIURIL) 50 MG tablet [Pharmacy Med Name: hydroCHLOROthiazide 50 MG Oral Tablet] 90 tablet 0    Sig: Take 1 tablet by mouth once daily     Cardiovascular: Diuretics - Thiazide Passed - 06/24/2023 12:30 PM      Passed - Cr in normal range and within 180 days    Creat  Date Value Ref Range Status  04/17/2023 0.78 0.50 - 1.03 mg/dL Final   Creatinine, Urine  Date Value Ref Range Status  02/02/2013 88.7 mg/dL Final         Passed - K in normal range and within 180 days    Potassium  Date Value Ref Range Status  04/17/2023 3.5 3.5 - 5.3 mmol/L Final         Passed - Na in normal range and within 180 days    Sodium  Date Value Ref Range Status  04/17/2023 140 135 - 146 mmol/L Final  04/20/2020 141 134 - 144 mmol/L Final         Passed - Last BP in normal range    BP Readings from Last 1 Encounters:  04/17/23 132/80         Passed - Valid encounter within last 6 months    Recent Outpatient Visits           1 month ago Anxiety   [place] [facility], Salvadore Oxford, NP   2 months ago Encounter for general adult medical examination with abnormal findings   [place] [facility] [place], Salvadore Oxford, NP   12 months ago Prediabetes   [place] [facility] [place], Salvadore Oxford, NP   1 year ago Encounter for general adult medical examination with abnormal findings   [place] [facility] [place], Salvadore Oxford, NP   1 year ago Skin tags, multiple acquired   [place] [facility] [place], Salvadore Oxford, NP       Future Appointments             In 3 months Baity, Salvadore Oxford, NP [place] [facility]

## 2023-07-10 ENCOUNTER — Ambulatory Visit (INDEPENDENT_AMBULATORY_CARE_PROVIDER_SITE_OTHER): Payer: 59 | Admitting: Internal Medicine

## 2023-07-10 VITALS — BP 130/82 | Ht 64.0 in | Wt 278.2 lb

## 2023-07-10 DIAGNOSIS — Z6841 Body Mass Index (BMI) 40.0 and over, adult: Secondary | ICD-10-CM

## 2023-07-10 DIAGNOSIS — G4733 Obstructive sleep apnea (adult) (pediatric): Secondary | ICD-10-CM | POA: Diagnosis not present

## 2023-07-10 DIAGNOSIS — E66813 Obesity, class 3: Secondary | ICD-10-CM

## 2023-07-10 DIAGNOSIS — R7303 Prediabetes: Secondary | ICD-10-CM

## 2023-07-10 MED ORDER — TIRZEPATIDE-WEIGHT MANAGEMENT 2.5 MG/0.5ML ~~LOC~~ SOLN
2.5000 mg | SUBCUTANEOUS | 0 refills | Status: DC
Start: 2023-07-10 — End: 2023-08-04

## 2023-07-10 NOTE — Patient Instructions (Signed)
 Tirzepatide Injection (Weight Management) What is this medication? TIRZEPATIDE (tir ZEP a tide) promotes weight loss. It may also be used to maintain weight loss.  It works by decreasing appetite. Changes to diet and exercise are often combined with this medication. This medicine may be used for other purposes; ask your health care provider or pharmacist if you have questions. COMMON BRAND NAME(S): Zepbound What should I tell my care team before I take this medication? They need to know if you have any of these conditions: Diabetes Eye disease caused by diabetes Gallbladder disease Have or have had depression Have or have had pancreatitis Having surgery Kidney disease Personal or family history of MEN 2, a condition that causes endocrine gland tumors Personal or family history of thyroid cancer Stomach or intestine problems, such as problems digesting food Suicidal thoughts, plans, or attempt An unusual or allergic reaction to tirzepatide, other medications, foods, dyes, or preservatives Pregnant or trying to get pregnant Breastfeeding How should I use this medication? This medication is injected under the skin. You will be taught how to prepare and give it. Take it as directed on the prescription label. Keep taking it unless your care team tells you to stop. It is important that you put your used needles and syringes in a special sharps container. Do not put them in a trash can. If you do not have a sharps container, call your pharmacist or care team to get one. A special MedGuide will be given to you by the pharmacist with each prescription and refill. Be sure to read this information carefully each time. This medication comes with INSTRUCTIONS FOR USE. Ask your pharmacist for directions on how to use this medication. Read the information carefully. Talk to your pharmacist or care team if you have questions. Talk to your care team about the use of this medication in children. Special care  may be needed. Overdosage: If you think you have taken too much of this medicine contact a poison control center or emergency room at once. NOTE: This medicine is only for you. Do not share this medicine with others. What if I miss a dose? If you miss a dose, take it as soon as you can unless it is more than 4 days (96 hours) late. If it is more than 4 days late, skip the missed dose. Take the next dose at the normal time. Do not take 2 doses within 3 days (72 hours) of each other. What may interact with this medication? Certain medications for diabetes, such as insulin, glyburide, glipizide This medication may affect how other medications work. Talk with your care team about all of the medications you take. They may suggest changes to your treatment plan to lower the risk of side effects and to make sure your medications work as intended. This list may not describe all possible interactions. Give your health care provider a list of all the medicines, herbs, non-prescription drugs, or dietary supplements you use. Also tell them if you smoke, drink alcohol, or use illegal drugs. Some items may interact with your medicine. What should I watch for while using this medication? Visit your care team for regular checks on your progress. Tell your care team if your condition does not start to get better or if it gets worse. Tell your care team if you are taking medication to treat diabetes, such as insulin or glipizide. This may increase your risk of low blood sugar. Know the symptoms of low blood sugar and how to  treat it. Talk to your care team about your risk of cancer. You may be more at risk for certain types of cancer if you take this medication. Talk to your care team right away if you have a lump or swelling in your neck, hoarseness that does not go away, trouble swallowing, shortness of breath, or trouble breathing. Make sure you stay hydrated while taking this medication. Drink water often. Eat fruits  and veggies that have a high water content. Drink more water when it is hot or you are active. Talk to your care team right away if you have fever, infection, vomiting, diarrhea, or if you sweat a lot while taking this medication. The loss of too much body fluid may make it dangerous for you to take this medication. If you are going to need surgery or a procedure, tell your care team that you are taking this medication. Estrogen and progestin hormones that you take by mouth may not work as well while you are taking this medication. Switch to a non-oral contraceptive or add a barrier contraceptive for 4 weeks after starting this medication and after each dose increase. Talk to your care team about contraceptive options. They can help you find the option that works for you. What side effects may I notice from receiving this medication? Side effects that you should report to your care team as soon as possible: Allergic reactions or angioedema--skin rash, itching or hives, swelling of the face, eyes, lips, tongue, arms, or legs, trouble swallowing or breathing Bowel blockage--stomach cramping, unable to have a bowel movement or pass gas, loss of appetite, vomiting Change in vision Dehydration--increased thirst, dry mouth, feeling faint or lightheaded, headache, dark yellow or brown urine Gallbladder problems--severe stomach pain, nausea, vomiting, fever Kidney injury--decrease in the amount of urine, swelling of the ankles, hands, or feet Pancreatitis--severe stomach pain that spreads to your back or gets worse after eating or when touched, fever, nausea, vomiting Thoughts of suicide or self-harm, worsening mood, feelings of depression Thyroid cancer--new mass or lump in the neck, pain or trouble swallowing, trouble breathing, hoarseness Side effects that usually do not require medical attention (report these to your care team if they continue or are bothersome): Diarrhea Loss of appetite Nausea Upset  stomach This list may not describe all possible side effects. Call your doctor for medical advice about side effects. You may report side effects to FDA at 1-800-FDA-1088. Where should I keep my medication? Keep out of the reach of children and pets. Store in a refrigerator or at room temperature up to 30 degrees C (86 degrees F). Keep it in the original container. Protect from light. Refrigeration (preferred): Store in the refrigerator. Do not freeze. Get rid of any unused medication after the expiration date. Room temperature: This medication may be stored at room temperature for up to 21 days. If it is stored at room temperature, get rid of any unused medication after 21 days or after it expires, whichever is first. To get rid of medications that are no longer needed or have expired: Take the medication to a medication take-back program. Check with your pharmacy or law enforcement to find a location. If you cannot return the medication, ask your pharmacist or care team how to get rid of this medication safely. NOTE: This sheet is a summary. It may not cover all possible information. If you have questions about this medicine, talk to your doctor, pharmacist, or health care provider.  2024 Elsevier/Gold Standard (2023-05-02 00:00:00)

## 2023-07-10 NOTE — Progress Notes (Signed)
 Subjective:    Patient ID: Jody Jensen, female    DOB: 1970-12-09, 53 y.o.   MRN: [id number]  HPI  Discussed the use of AI scribe software for clinical note transcription with the patient, who gave verbal consent to proceed.  Jody Jensen is a 53 year old female who presents for medication management of severe sleep apnea, prediabetes and obesity.  She is seeking management options for her severe sleep apnea. Her insurance does not cover Zepbound , an FDA-approved medication for sleep apnea, but it does cover Trizipatide, pending pre-authorization.  She has a history of using phentermine and metformin for weight management but reports no significant weight loss with these medications. Phentermine caused increased thirst, leading her to drink more water. She has not been on phentermine for a few months and is currently using Effexor , which should not be taken with phentermine.  In terms of lifestyle interventions, she has previously tried Weight Watchers with some success when she was younger but has not engaged in any structured diet programs recently. She is attempting to manage her weight independently.       Review of Systems   Past Medical History:  Diagnosis Date   Allergy    As a child   Anxiety    Arthritis    In knees   Asthma    Diabetes mellitus without complication (HCC)    Hyperlipidemia    Hypertension    Obesity    OSA (obstructive sleep apnea)    Ovarian cyst    Sleep apnea     Current Outpatient Medications  Medication Sig Dispense Refill   albuterol  (ACCUNEB ) 0.63 MG/3ML nebulizer solution Take 1 ampule by nebulization every 6 (six) hours as needed for wheezing.     albuterol  (VENTOLIN  HFA) 108 (90 Base) MCG/ACT inhaler Inhale 1-2 puffs into the lungs every 6 (six) hours as needed. 8 g 0   famotidine  (PEPCID ) 20 MG tablet TAKE 1 TABLET BY MOUTH AT BEDTIME 90 tablet 1   hydrochlorothiazide  (HYDRODIURIL ) 50 MG tablet Take 1 tablet by mouth once  daily 90 tablet 0   levocetirizine (XYZAL ) 5 MG tablet Take 1 tablet (5 mg total) by mouth every morning. 90 tablet 1   montelukast  (SINGULAIR ) 10 MG tablet Take 1 tablet (10 mg total) by mouth as needed. 90 tablet 1   phentermine 37.5 MG capsule Take 37.5 mg by mouth daily.     venlafaxine  XR (EFFEXOR  XR) 75 MG 24 hr capsule Take 1 capsule (75 mg total) by mouth daily with breakfast. 90 capsule 0   venlafaxine  XR (EFFEXOR -XR) 150 MG 24 hr capsule TAKE 1 CAPSULE BY MOUTH ONCE DAILY WITH BREAKFAST (COURTESY  REFILL,  MUST  KEEP  UPCOMING  APPOINTMENT  FOR  FURTHER  REFILL) 90 capsule 1   Vitamin D , Ergocalciferol , (DRISDOL ) 1.25 MG (50000 UNIT) CAPS capsule Take 50,000 Units by mouth once a week.     No current facility-administered medications for this visit.    No Known Allergies  Family History  Problem Relation Age of Onset   Hypertension Mother    Hyperlipidemia Mother    Heart disease Mother    Diabetes Mother    Asthma Mother    Thyroid  disease Mother    Lupus Mother    Immunodeficiency Mother    Arthritis Mother    Heart disease Father    Hypertension Father    Cervical cancer Paternal Grandmother        cervical   Leukemia Paternal  Grandmother    Diabetes Paternal Grandfather    Stroke Paternal Grandfather    Diabetes Paternal Aunt    Prostate cancer Paternal Uncle    Heart disease Paternal Uncle     Social History   Socioeconomic History   Marital status: Married    Spouse name: Not on file   Number of children: 2   Years of education: Not on file   Highest education level: Associate degree: occupational, scientist, product/process development, or vocational program  Occupational History   Not on file  Tobacco Use   Smoking status: Former    Current packs/day: 0.00    Average packs/day: 2.0 packs/day for 12.0 years (24.0 ttl pk-yrs)    Types: Cigarettes, E-cigarettes    Start date: 02/03/2000    Quit date: 02/03/2012    Years since quitting: 11.4   Smokeless tobacco: Never  Vaping Use    Vaping status: Former   Devices: quit in july 2022  Substance and Sexual Activity   Alcohol use: Yes    Comment: rare   Drug use: No   Sexual activity: Yes    Birth control/protection: Surgical  Other Topics Concern   Not on file  Social History Narrative   Not on file   Social Drivers of Health   Financial Resource Strain: Low Risk  (07/10/2023)   Overall Financial Resource Strain (CARDIA)    Difficulty of Paying Living Expenses: Not hard at all  Food Insecurity: No Food Insecurity (07/10/2023)   Hunger Vital Sign    Worried About Running Out of Food in the Last Year: Never true    Ran Out of Food in the Last Year: Never true  Transportation Needs: No Transportation Needs (07/10/2023)   PRAPARE - Administrator, Civil Service (Medical): No    Lack of Transportation (Non-Medical): No  Physical Activity: Insufficiently Active (07/10/2023)   Exercise Vital Sign    Days of Exercise per Week: 2 days    Minutes of Exercise per Session: 20 min  Stress: No Stress Concern Present (07/10/2023)   [employer] of Occupational Health - Occupational Stress Questionnaire    Feeling of Stress : Only a little  Social Connections: Socially Integrated (07/10/2023)   Social Connection and Isolation Panel [NHANES]    Frequency of Communication with Friends and Family: Three times a week    Frequency of Social Gatherings with Friends and Family: Once a week    Attends Religious Services: More than 4 times per year    Active Member of [employer] or Organizations: No    Attends Engineer, Structural: More than 4 times per year    Marital Status: Married  Catering Manager Violence: Not on file     Constitutional: Denies fever, malaise, fatigue, headache or abrupt weight changes.  HEENT: Denies eye pain, eye redness, ear pain, ringing in the ears, wax buildup, runny nose, nasal congestion, bloody nose, or sore throat. Respiratory: Denies difficulty breathing, shortness of breath,  cough or sputum production.   Cardiovascular: Denies chest pain, chest tightness, palpitations or swelling in the hands or feet.  Gastrointestinal: Denies abdominal pain, bloating, constipation, diarrhea or blood in the stool.  GU: Denies urgency, frequency, pain with urination, burning sensation, blood in urine, odor or discharge. Musculoskeletal: Patient reports joint pain.  Denies decrease in range of motion, difficulty with gait, muscle pain or joint swelling.  Skin: Denies redness, rashes, lesions or ulcercations.  Neurological: Denies dizziness, difficulty with memory, difficulty with speech or problems with  balance and coordination.  Psych: Patient has a history of anxiety.  Denies depression, SI/HI.  No other specific complaints in a complete review of systems (except as listed in HPI above).      Objective:   Physical Exam  BP 130/82 (BP Location: Left Arm, Patient Position: Sitting, Cuff Size: Large)   Ht 5' 4 (1.626 m)   Wt 278 lb 3.2 oz (126.2 kg)   LMP 11/02/1997   BMI 47.75 kg/m    Wt Readings from Last 3 Encounters:  04/17/23 268 lb (121.6 kg)  01/31/23 263 lb 12.8 oz (119.7 kg)  06/28/22 281 lb (127.5 kg)    General: Appears her stated age, obese, in NAD. Skin: Warm, dry and intact.  Cardiovascular: Normal rate. Pulmonary/Chest: Normal effort and positive vesicular breath sounds. No respiratory distress. No wheezes, rales or ronchi noted.  Neurological: Alert and oriented.   BMET    Component Value Date/Time   NA 140 04/17/2023 1545   NA 141 04/20/2020 0957   K 3.5 04/17/2023 1545   CL 99 04/17/2023 1545   CO2 34 (H) 04/17/2023 1545   GLUCOSE 96 04/17/2023 1545   BUN 16 04/17/2023 1545   BUN 11 04/20/2020 0957   CREATININE 0.78 04/17/2023 1545   CALCIUM 9.3 04/17/2023 1545   GFRNONAA >60 12/29/2020 1924   GFRAA 113 04/20/2020 0957    Lipid Panel     Component Value Date/Time   CHOL 185 04/17/2023 1545   TRIG 127 04/17/2023 1545   HDL 63  04/17/2023 1545   CHOLHDL 2.9 04/17/2023 1545   VLDL 20.6 09/01/2015 0815   LDLCALC 100 (H) 04/17/2023 1545    CBC    Component Value Date/Time   WBC 7.6 04/17/2023 1545   RBC 4.90 04/17/2023 1545   HGB 14.2 04/17/2023 1545   HCT 43.3 04/17/2023 1545   PLT 222 04/17/2023 1545   MCV 88.4 04/17/2023 1545   MCH 29.0 04/17/2023 1545   MCHC 32.8 04/17/2023 1545   RDW 13.4 04/17/2023 1545   LYMPHSABS 2.7 12/02/2012 1521   MONOABS 0.5 12/02/2012 1521   EOSABS 0.2 12/02/2012 1521   BASOSABS 0.0 12/02/2012 1521    Hgb A1C Lab Results  Component Value Date   HGBA1C 5.7 (H) 04/17/2023            Assessment & Plan:   Assessment and Plan    Obstructive Sleep Apnea, Prediabetes Patient interested in trying Zepbound  or Trizipatide. Insurance coverage uncertain. Patient is aware of the dosing schedule and potential out-of-pocket cost. -Submit prescription for [facility]zipatide to [facility] with a prior authorization. -If insurance does not cover, discuss alternative plans.  Weight Management Patient has tried phentermine and metformin in the past with no significant weight loss. Patient has also tried Weight Watchers with some success in the past. -Consider re-engaging with a structured weight loss program such as Weight Watchers. -Consider alternative weight loss medications if Trizipatide is not covered by insurance.   RTC in 3 months, follow-up chronic conditions Angeline Laura, NP

## 2023-07-15 ENCOUNTER — Encounter: Payer: Self-pay | Admitting: Internal Medicine

## 2023-07-28 ENCOUNTER — Other Ambulatory Visit: Payer: Self-pay | Admitting: Internal Medicine

## 2023-07-29 ENCOUNTER — Other Ambulatory Visit: Payer: Self-pay | Admitting: Internal Medicine

## 2023-07-29 NOTE — Telephone Encounter (Signed)
 Requested Prescriptions  Pending Prescriptions Disp Refills   montelukast (SINGULAIR) 10 MG tablet [Pharmacy Med Name: Montelukast Sodium 10 MG Oral Tablet] 90 tablet 0    Sig: TAKE 1 TABLET BY MOUTH AS NEEDED     Pulmonology:  Leukotriene Inhibitors Passed - 07/29/2023 10:58 AM      Passed - Valid encounter within last 12 months    Recent Outpatient Visits           2 months ago Anxiety   [place] [facility] [place], Salvadore Oxford, NP   3 months ago Encounter for general adult medical examination with abnormal findings   [place] [facility] [place], Salvadore Oxford, NP   1 year ago Prediabetes   [place] [facility] [place], Salvadore Oxford, NP   1 year ago Encounter for general adult medical examination with abnormal findings   [place] [facility] [place], Salvadore Oxford, NP   1 year ago Skin tags, multiple acquired   [place] [facility] [place], Salvadore Oxford, NP       Future Appointments             In 2 months Baity, Salvadore Oxford, NP [place] [facility], [facility]

## 2023-07-30 NOTE — Telephone Encounter (Signed)
 Requested medication (s) are due for refill today: clarification on medication   Requested medication (s) are on the active medication list: yes  Last refill:  07/29/23  Future visit scheduled: yes  Notes to clinic:  Pharmacy comment: Please clarify the directions  for this prescription. once daily?      Requested Prescriptions  Pending Prescriptions Disp Refills   montelukast (SINGULAIR) 10 MG tablet [Pharmacy Med Name: MONTELUKAST 10MG  TAB] 90 tablet 0    Sig: TAKE 1 TABLET BY MOUTH AS NEEDED     Pulmonology:  Leukotriene Inhibitors Passed - 07/30/2023  1:58 PM      Passed - Valid encounter within last 12 months    Recent Outpatient Visits           2 months ago Anxiety   [place] [facility] [place], Salvadore Oxford, NP   3 months ago Encounter for general adult medical examination with abnormal findings   [place] [facility] [place], Salvadore Oxford, NP   1 year ago Prediabetes   [place] [facility] [place], Salvadore Oxford, NP   1 year ago Encounter for general adult medical examination with abnormal findings   [place] [facility] [place], Salvadore Oxford, NP   1 year ago Skin tags, multiple acquired   [place] [facility] [place], Salvadore Oxford, NP       Future Appointments             In 2 months Baity, Salvadore Oxford, NP  [facility], [facility]

## 2023-08-02 ENCOUNTER — Other Ambulatory Visit: Payer: Self-pay | Admitting: Internal Medicine

## 2023-08-04 ENCOUNTER — Encounter: Payer: Self-pay | Admitting: Internal Medicine

## 2023-08-04 MED ORDER — TIRZEPATIDE-WEIGHT MANAGEMENT 5 MG/0.5ML ~~LOC~~ SOAJ: 5.0000 mg | SUBCUTANEOUS | 0 refills | Status: DC

## 2023-08-04 NOTE — Telephone Encounter (Signed)
 Requested medication (s) are due for refill today: Yes  Requested medication (s) are on the active medication list: Yes  Last refill:  07/10/23  Future visit scheduled: Yes  Notes to clinic:  Unable to refill due to no refill protocol for this medication.      Requested Prescriptions  Pending Prescriptions Disp Refills   ZEPBOUND 2.5 MG/0.5ML Pen [Pharmacy Med Name: Zepbound 2.5 MG/0.5ML Subcutaneous Solution Auto-injector] 4 mL 0    Sig: INJECT 2.5 MG SUBCUTANEOUSLY  ONCE A WEEK     Off-Protocol Failed - 08/04/2023  1:39 PM      Failed - Medication not assigned to a protocol, review manually.      Passed - Valid encounter within last 12 months    Recent Outpatient Visits           2 months ago Anxiety   [place] [facility] [place], Salvadore Oxford, NP   3 months ago Encounter for general adult medical examination with abnormal findings   [place] [facility] [place], Salvadore Oxford, NP   1 year ago Prediabetes   [place] [facility] [place], Salvadore Oxford, NP   1 year ago Encounter for general adult medical examination with abnormal findings   [place] [facility] [place], Salvadore Oxford, NP   1 year ago Skin tags, multiple acquired   [place] [facility] [place], Salvadore Oxford, NP       Future Appointments             In 2 months Baity, Salvadore Oxford, NP [place], [facility]

## 2023-08-27 ENCOUNTER — Telehealth: Payer: Self-pay

## 2023-08-27 MED ORDER — SEMAGLUTIDE-WEIGHT MANAGEMENT 0.25 MG/0.5ML ~~LOC~~ SOAJ
0.2500 mg | SUBCUTANEOUS | 0 refills | Status: AC
Start: 2023-08-27 — End: ?

## 2023-08-27 NOTE — Addendum Note (Signed)
 Addended by: Lorre Munroe on: 08/27/2023 09:55 AM   Modules accepted: Orders

## 2023-08-27 NOTE — Telephone Encounter (Signed)
 Chandra Batch Shankles (Key: [id number]) Rx #: [id number] [phone] 0.25MG /0.5ML auto-injectors Form [employer] Electronic Prior Authorization Form (2017 NCPDP) Created 2 hours ago Sent to Plan 22 minutes ago Plan Response 21 minutes ago Submit Clinical Questions less than a minute ago Determination Wait for Determination Please wait for [employer] 2017 NCPDP to return a determination.

## 2023-09-18 ENCOUNTER — Telehealth: Payer: Self-pay

## 2023-09-18 NOTE — Telephone Encounter (Addendum)
 Jody Jensen (Key: [id number]) Rx #: [id number] 0.25MG /0.5ML auto-injectors Form [employer] Electronic Prior Authorization Form (2017 NCPDP) Sent to plan  Your prior authorization for [name] has been denied. Return to Dashboard When applicable, information about how to complete an appeal for this patient will be sent to you. Please also see the determination letter provided by the payer/PBM for more information.  Message from plan: Request Reference Number: [id number]. WEGOVY INJ 0.25MG  is denied for not meeting the prior authorization requirement(s). Details of this decision are in the notice attached below or have been faxed to you.

## 2023-09-25 ENCOUNTER — Other Ambulatory Visit: Payer: Self-pay | Admitting: Internal Medicine

## 2023-09-26 NOTE — Telephone Encounter (Signed)
 Requested Prescriptions  Pending Prescriptions Disp Refills   hydrochlorothiazide  (HYDRODIURIL ) 50 MG tablet [Pharmacy Med Name: hydroCHLOROthiazide  50 MG Oral Tablet] 90 tablet 0    Sig: Take 1 tablet by mouth once daily     Cardiovascular: Diuretics - Thiazide Passed - 09/26/2023  9:01 AM      Passed - Cr in normal range and within 180 days    Creat  Date Value Ref Range Status  04/17/2023 0.78 0.50 - 1.03 mg/dL Final   Creatinine, Urine  Date Value Ref Range Status  02/02/2013 88.7 mg/dL Final         Passed - K in normal range and within 180 days    Potassium  Date Value Ref Range Status  04/17/2023 3.5 3.5 - 5.3 mmol/L Final         Passed - Na in normal range and within 180 days    Sodium  Date Value Ref Range Status  04/17/2023 140 135 - 146 mmol/L Final  04/20/2020 141 134 - 144 mmol/L Final         Passed - Last BP in normal range    BP Readings from Last 1 Encounters:  07/10/23 130/82         Passed - Valid encounter within last 6 months    Recent Outpatient Visits           2 months ago Class 3 severe obesity due to excess calories with serious comorbidity and body mass index (BMI) of 45.0 to 49.9 in adult [facility])   [place] [facility] [place], Rankin Buzzard, NP       Future Appointments             In 2 weeks Thalia Filler, Rankin Buzzard, NP [place] [facility], [facility]

## 2023-10-16 ENCOUNTER — Ambulatory Visit: Payer: Self-pay | Admitting: Internal Medicine

## 2023-10-22 ENCOUNTER — Other Ambulatory Visit: Payer: Self-pay | Admitting: Internal Medicine

## 2023-10-23 NOTE — Telephone Encounter (Signed)
 Requested Prescriptions  Pending Prescriptions Disp Refills   famotidine  (PEPCID ) 20 MG tablet [Pharmacy Med Name: Famotidine  20 MG Oral Tablet] 90 tablet 1    Sig: TAKE 1 TABLET BY MOUTH AT BEDTIME     Gastroenterology:  H2 Antagonists Passed - 10/23/2023  1:50 PM      Passed - Valid encounter within last 12 months    Recent Outpatient Visits           3 months ago Class 3 severe obesity due to excess calories with serious comorbidity and body mass index (BMI) of 45.0 to 49.9 in adult [facility])   [facility] [place], Rankin Buzzard, NP       Future Appointments             In 3 weeks Trenda Frisk, FNP [facility]

## 2023-11-14 ENCOUNTER — Ambulatory Visit: Payer: Self-pay | Admitting: Family

## 2023-11-14 ENCOUNTER — Encounter: Payer: Self-pay | Admitting: Family

## 2023-11-14 VITALS — BP 104/78 | HR 75 | Ht 64.0 in | Wt 283.8 lb

## 2023-11-14 DIAGNOSIS — E538 Deficiency of other specified B group vitamins: Secondary | ICD-10-CM

## 2023-11-14 DIAGNOSIS — G4733 Obstructive sleep apnea (adult) (pediatric): Secondary | ICD-10-CM

## 2023-11-14 DIAGNOSIS — E782 Mixed hyperlipidemia: Secondary | ICD-10-CM | POA: Diagnosis not present

## 2023-11-14 DIAGNOSIS — E041 Nontoxic single thyroid nodule: Secondary | ICD-10-CM

## 2023-11-14 DIAGNOSIS — R7303 Prediabetes: Secondary | ICD-10-CM

## 2023-11-14 DIAGNOSIS — Z6841 Body Mass Index (BMI) 40.0 and over, adult: Secondary | ICD-10-CM

## 2023-11-14 DIAGNOSIS — I10 Essential (primary) hypertension: Secondary | ICD-10-CM | POA: Diagnosis not present

## 2023-11-14 DIAGNOSIS — E559 Vitamin D deficiency, unspecified: Secondary | ICD-10-CM

## 2023-11-14 DIAGNOSIS — E66813 Obesity, class 3: Secondary | ICD-10-CM

## 2023-11-14 MED ORDER — ZEPBOUND 5 MG/0.5ML ~~LOC~~ SOAJ: 5.0000 mg | SUBCUTANEOUS | 1 refills | Status: DC

## 2023-11-14 NOTE — Patient Instructions (Signed)
 Our phone number is [phone] Ivette Marks is my Nurse, her extension is [phone] Shelagh Derrick is the [employer], her extension is [phone].   If you need anything, please feel free to call.

## 2023-11-14 NOTE — Progress Notes (Signed)
 New Patient Office Visit  Subjective  Patient ID: Jody Jensen, female    DOB: Aug 15, 1970  Age: 53 y.o. MRN: [id number]  CC:  Chief Complaint  Patient presents with   Establish Care    NPE    HPI Jody Jensen presents to establish care.  Previous Primary Care provider/office:   she does have additional concerns to discuss today.   She needs weight loss meds, asks if we can get her zepbound , as a result of her OSA.  She has been on previously, but needs PA to get started back on these.      Outpatient Encounter Medications as of 11/14/2023  Medication Sig   albuterol  (ACCUNEB ) 0.63 MG/3ML nebulizer solution Take 1 ampule by nebulization every 6 (six) hours as needed for wheezing.   albuterol  (VENTOLIN  HFA) 108 (90 Base) MCG/ACT inhaler Inhale 1-2 puffs into the lungs every 6 (six) hours as needed.   famotidine  (PEPCID ) 20 MG tablet TAKE 1 TABLET BY MOUTH AT BEDTIME   hydrochlorothiazide  (HYDRODIURIL ) 50 MG tablet Take 1 tablet by mouth once daily   levocetirizine (XYZAL ) 5 MG tablet Take 1 tablet (5 mg total) by mouth every morning.   montelukast  (SINGULAIR ) 10 MG tablet TAKE 1 TABLET BY MOUTH AS NEEDED   venlafaxine  XR (EFFEXOR -XR) 150 MG 24 hr capsule TAKE 1 CAPSULE BY MOUTH ONCE DAILY WITH BREAKFAST (COURTESY  REFILL,  MUST  KEEP  UPCOMING  APPOINTMENT  FOR  FURTHER  REFILL)   Vitamin D , Ergocalciferol , (DRISDOL ) 1.25 MG (50000 UNIT) CAPS capsule Take 50,000 Units by mouth once a week.   Semaglutide -Weight Management 0.25 MG/0.5ML SOAJ Inject 0.25 mg into the skin once a week. (Patient not taking: Reported on 11/14/2023)   No facility-administered encounter medications on file as of 11/14/2023.    Past Medical History:  Diagnosis Date   Allergy    As a child   Anxiety    Arthritis    In knees   Asthma    Diabetes mellitus without complication (HCC)    Hyperlipidemia    Hypertension    Obesity    OSA (obstructive sleep apnea)    Ovarian cyst    Sleep apnea      Past Surgical History:  Procedure Laterality Date   ABDOMINAL HYSTERECTOMY      partial, ovaries remain, no cervix   bone spur Right    BREAST EXCISIONAL BIOPSY Left    BREAST SURGERY     benign breast cyst removal   CESAREAN SECTION     CHOLECYSTECTOMY     NASAL SINUS SURGERY     ovarian cyst removal     TUBAL LIGATION      Family History  Problem Relation Age of Onset   Hypertension Mother    Hyperlipidemia Mother    Heart disease Mother    Diabetes Mother    Asthma Mother    Thyroid  disease Mother    Lupus Mother    Immunodeficiency Mother    Arthritis Mother    Heart disease Father    Hypertension Father    Cervical cancer Paternal Grandmother        cervical   Leukemia Paternal Grandmother    Diabetes Paternal Grandfather    Stroke Paternal Grandfather    Diabetes Paternal Aunt    Prostate cancer Paternal Uncle    Heart disease Paternal Uncle     Social History   Socioeconomic History   Marital status: Married    Spouse name: Not on  file   Number of children: 2   Years of education: Not on file   Highest education level: Associate degree: occupational, technical, or vocational program  Occupational History   Not on file  Tobacco Use   Smoking status: Former    Current packs/day: 0.00    Average packs/day: 2.0 packs/day for 12.0 years (24.0 ttl pk-yrs)    Types: Cigarettes, E-cigarettes    Start date: 02/03/2000    Quit date: 02/03/2012    Years since quitting: 11.7   Smokeless tobacco: Never  Vaping Use   Vaping status: Former   Devices: quit in july 2022  Substance and Sexual Activity   Alcohol use: Yes    Comment: rare   Drug use: No   Sexual activity: Yes    Birth control/protection: Surgical  Other Topics Concern   Not on file  Social History Narrative   Not on file   Social Drivers of Health   Financial Resource Strain: Low Risk  (07/10/2023)   Overall Financial Resource Strain (CARDIA)    Difficulty of Paying Living Expenses: Not  hard at all  Food Insecurity: No Food Insecurity (07/10/2023)   Hunger Vital Sign    Worried About Running Out of Food in the Last Year: Never true    Ran Out of Food in the Last Year: Never true  Transportation Needs: No Transportation Needs (07/10/2023)   PRAPARE - Administrator, Civil Service (Medical): No    Lack of Transportation (Non-Medical): No  Physical Activity: Insufficiently Active (07/10/2023)   Exercise Vital Sign    Days of Exercise per Week: 2 days    Minutes of Exercise per Session: 20 min  Stress: No Stress Concern Present (07/10/2023)   [employer] of Occupational Health - Occupational Stress Questionnaire    Feeling of Stress : Only a little  Social Connections: Socially Integrated (07/10/2023)   Social Connection and Isolation Panel    Frequency of Communication with Friends and Family: Three times a week    Frequency of Social Gatherings with Friends and Family: Once a week    Attends Religious Services: More than 4 times per year    Active Member of [employer] or Organizations: No    Attends Engineer, structural: More than 4 times per year    Marital Status: Married  Catering manager Violence: Not on file    Review of Systems  All other systems reviewed and are negative.       Objective   BP 104/78   Pulse 75   Ht 5' 4 (1.626 m)   Wt 283 lb 12.8 oz (128.7 kg)   LMP 11/02/1997   SpO2 99%   BMI 48.71 kg/m   Physical Exam Vitals and nursing note reviewed.  Constitutional:      Appearance: Normal appearance. She is normal weight.  HENT:     Head: Normocephalic.  Eyes:     Extraocular Movements: Extraocular movements intact.     Conjunctiva/sclera: Conjunctivae normal.     Pupils: Pupils are equal, round, and reactive to light.  Cardiovascular:     Rate and Rhythm: Normal rate.  Pulmonary:     Effort: Pulmonary effort is normal.  Neurological:     General: No focal deficit present.     Mental Status: She is alert and oriented  to person, place, and time. Mental status is at baseline.  Psychiatric:        Mood and Affect: Mood normal.  Behavior: Behavior normal.        Thought Content: Thought content normal.       Assessment & Plan Prediabetes A1C Continues to be in prediabetic ranges.  Will reassess at follow up after next lab check.  Patient counseled on dietary choices and verbalized understanding.   -CBC w/Diff -CMP w/eGFR -Hemoglobin A1C   OSA (obstructive sleep apnea) Starting patient on Zepbound .  2.5 MG samples given in office  today.  Will reassess at follow up.   Primary hypertension Blood pressure well controlled with current medications.  Continue current therapy.  Will reassess at follow up.   - CBC w/Diff - CMP w/eGFR  Class 3 severe obesity due to excess calories with body mass index (BMI) of 45.0 to 49.9 in adult Continue current meds.  Will adjust as needed based on results.  The patient is asked to make an attempt to improve diet and exercise patterns to aid in medical management of this problem. Addressed importance of increasing and maintaining water intake.   Vitamin D  deficiency, unspecified B12 deficiency due to diet Thyroid  nodule Checking labs today.  Will continue supplements as needed.   - Vitamin D  - Vitamin B12 - TSH  Mixed hyperlipidemia Checking labs today.  Continue current therapy for lipid control. Will modify as needed based on labwork results.   -CMP w/eGFR -Lipid Panel    Return in about 2 weeks (around 11/28/2023) for NP F/U.   Total time spent: 30 minutes  ALAN CHRISTELLA ARRANT, FNP  11/14/2023  This document may have been prepared by [facility] Voice Recognition software and as such may include unintentional dictation errors.

## 2023-11-15 LAB — CMP14+EGFR
ALT: 52 IU/L — ABNORMAL HIGH (ref 0–32)
AST: 39 IU/L (ref 0–40)
Albumin: 4.4 g/dL (ref 3.8–4.9)
Alkaline Phosphatase: 112 IU/L (ref 44–121)
BUN/Creatinine Ratio: 22 (ref 9–23)
BUN: 16 mg/dL (ref 6–24)
Bilirubin Total: 0.3 mg/dL (ref 0.0–1.2)
CO2: 27 mmol/L (ref 20–29)
Calcium: 9.7 mg/dL (ref 8.7–10.2)
Chloride: 98 mmol/L (ref 96–106)
Creatinine, Ser: 0.73 mg/dL (ref 0.57–1.00)
Globulin, Total: 2.1 g/dL (ref 1.5–4.5)
Glucose: 69 mg/dL — ABNORMAL LOW (ref 70–99)
Potassium: 3.5 mmol/L (ref 3.5–5.2)
Sodium: 141 mmol/L (ref 134–144)
Total Protein: 6.5 g/dL (ref 6.0–8.5)
eGFR: 99 mL/min/{1.73_m2} (ref >59)

## 2023-11-15 LAB — CBC WITH DIFFERENTIAL/PLATELET
Basophils Absolute: 0.1 10*3/uL (ref 0.0–0.2)
Basos: 1 %
EOS (ABSOLUTE): 0.3 10*3/uL (ref 0.0–0.4)
Eos: 4 %
Hematocrit: 46.2 % (ref 34.0–46.6)
Hemoglobin: 14.9 g/dL (ref 11.1–15.9)
Immature Grans (Abs): 0 10*3/uL (ref 0.0–0.1)
Immature Granulocytes: 0 %
Lymphocytes Absolute: 3.2 10*3/uL — ABNORMAL HIGH (ref 0.7–3.1)
Lymphs: 44 %
MCH: 29.7 pg (ref 26.6–33.0)
MCHC: 32.3 g/dL (ref 31.5–35.7)
MCV: 92 fL (ref 79–97)
Monocytes Absolute: 0.6 10*3/uL (ref 0.1–0.9)
Monocytes: 8 %
Neutrophils Absolute: 3.1 10*3/uL (ref 1.4–7.0)
Neutrophils: 43 %
Platelets: 203 10*3/uL (ref 150–450)
RBC: 5.02 x10E6/uL (ref 3.77–5.28)
RDW: 14.6 % (ref 11.7–15.4)
WBC: 7.3 10*3/uL (ref 3.4–10.8)

## 2023-11-15 LAB — HEMOGLOBIN A1C
Est. average glucose Bld gHb Est-mCnc: 120 mg/dL
Hgb A1c MFr Bld: 5.8 % — ABNORMAL HIGH (ref 4.8–5.6)

## 2023-11-15 LAB — LIPID PANEL
Chol/HDL Ratio: 3.1 ratio (ref 0.0–4.4)
Cholesterol, Total: 211 mg/dL — ABNORMAL HIGH (ref 100–199)
HDL: 67 mg/dL (ref >39)
LDL Chol Calc (NIH): 115 mg/dL — ABNORMAL HIGH (ref 0–99)
Triglycerides: 169 mg/dL — ABNORMAL HIGH (ref 0–149)
VLDL Cholesterol Cal: 29 mg/dL (ref 5–40)

## 2023-11-15 LAB — TSH: TSH: 1.05 u[IU]/mL (ref 0.450–4.500)

## 2023-11-15 LAB — VITAMIN B12: Vitamin B-12: 523 pg/mL (ref 232–1245)

## 2023-11-15 LAB — VITAMIN D 25 HYDROXY (VIT D DEFICIENCY, FRACTURES): Vit D, 25-Hydroxy: 26.7 ng/mL — ABNORMAL LOW (ref 30.0–100.0)

## 2023-11-17 ENCOUNTER — Ambulatory Visit: Payer: Self-pay

## 2023-11-28 ENCOUNTER — Encounter: Payer: Self-pay | Admitting: Family

## 2023-11-28 ENCOUNTER — Ambulatory Visit: Admitting: Family

## 2023-11-28 VITALS — BP 128/86 | HR 81 | Ht 64.0 in | Wt 284.2 lb

## 2023-11-28 DIAGNOSIS — E7800 Pure hypercholesterolemia, unspecified: Secondary | ICD-10-CM

## 2023-11-28 DIAGNOSIS — I10 Essential (primary) hypertension: Secondary | ICD-10-CM

## 2023-11-28 DIAGNOSIS — R7303 Prediabetes: Secondary | ICD-10-CM

## 2023-11-28 DIAGNOSIS — E041 Nontoxic single thyroid nodule: Secondary | ICD-10-CM

## 2023-11-28 DIAGNOSIS — E782 Mixed hyperlipidemia: Secondary | ICD-10-CM

## 2023-11-28 DIAGNOSIS — E538 Deficiency of other specified B group vitamins: Secondary | ICD-10-CM

## 2023-11-28 DIAGNOSIS — E559 Vitamin D deficiency, unspecified: Secondary | ICD-10-CM

## 2023-11-28 DIAGNOSIS — G4733 Obstructive sleep apnea (adult) (pediatric): Secondary | ICD-10-CM

## 2023-11-28 DIAGNOSIS — Z6841 Body Mass Index (BMI) 40.0 and over, adult: Secondary | ICD-10-CM

## 2023-11-28 MED ORDER — LEVOCETIRIZINE DIHYDROCHLORIDE 5 MG PO TABS: 5.0000 mg | ORAL_TABLET | Freq: Every morning | ORAL | 1 refills | Status: DC

## 2023-11-28 MED ORDER — VENLAFAXINE HCL ER 150 MG PO CP24: 150.0000 mg | ORAL_CAPSULE | Freq: Every day | ORAL | 1 refills | Status: DC

## 2023-11-28 MED ORDER — FAMOTIDINE 20 MG PO TABS: 20.0000 mg | ORAL_TABLET | Freq: Every day | ORAL | 1 refills | Status: AC

## 2023-11-28 MED ORDER — MONTELUKAST SODIUM 10 MG PO TABS: 10.0000 mg | ORAL_TABLET | ORAL | 0 refills | Status: AC | PRN

## 2023-11-28 MED ORDER — ALBUTEROL SULFATE HFA 108 (90 BASE) MCG/ACT IN AERS: 1.0000 | INHALATION_SPRAY | Freq: Four times a day (QID) | RESPIRATORY_TRACT | 0 refills | Status: AC | PRN

## 2023-11-28 MED ORDER — VITAMIN D (ERGOCALCIFEROL) 1.25 MG (50000 UNIT) PO CAPS: 50000.0000 [IU] | ORAL_CAPSULE | ORAL | 3 refills | Status: AC

## 2023-11-28 MED ORDER — HYDROCHLOROTHIAZIDE 50 MG PO TABS: 50.0000 mg | ORAL_TABLET | Freq: Every day | ORAL | 0 refills | Status: DC

## 2023-12-22 ENCOUNTER — Encounter: Payer: Self-pay | Admitting: Family

## 2024-01-02 ENCOUNTER — Encounter: Payer: Self-pay | Admitting: Family

## 2024-01-02 ENCOUNTER — Ambulatory Visit: Admitting: Family

## 2024-01-02 VITALS — BP 118/84 | HR 79 | Ht 64.0 in | Wt 280.8 lb

## 2024-01-02 DIAGNOSIS — G4733 Obstructive sleep apnea (adult) (pediatric): Secondary | ICD-10-CM | POA: Diagnosis not present

## 2024-01-02 DIAGNOSIS — R7303 Prediabetes: Secondary | ICD-10-CM

## 2024-01-02 DIAGNOSIS — E7800 Pure hypercholesterolemia, unspecified: Secondary | ICD-10-CM

## 2024-01-02 NOTE — Assessment & Plan Note (Signed)
 Continue current meds.  Will adjust as needed based on results.  The patient is asked to make an attempt to improve diet and exercise patterns to aid in medical management of this problem. Addressed importance of increasing and maintaining water intake.

## 2024-01-02 NOTE — Assessment & Plan Note (Signed)
 Continue current therapy for lipid control. Will modify as needed based on labwork results.

## 2024-01-02 NOTE — Progress Notes (Signed)
 Established Patient Office Visit  Subjective:  Patient ID: Jody Jensen, female    DOB: 02/22/1971  Age: 53 y.o. MRN: [id number]  Chief Complaint  Patient presents with   Follow-up    1 month follow up    Patient is here today for her 1 month follow up.  She has been feeling fairly well since last appointment.   She does have additional concerns to discuss today.  We have been trying to get her approved for her Zepbound , given that she has sleep apnea, and her insurance app says that this is an approved use for the medication.   Labs are not due today.  She needs refills.   I have reviewed her active problem list, medication list, allergies, notes from last encounter, lab results for her appointment today.      No other concerns at this time.   Past Medical History:  Diagnosis Date   Allergy    As a child   Anxiety    Arthritis    In knees   Asthma    Diabetes mellitus without complication (HCC)    Hyperlipidemia    Hypertension    Obesity    OSA (obstructive sleep apnea)    Ovarian cyst    Sleep apnea     Past Surgical History:  Procedure Laterality Date   ABDOMINAL HYSTERECTOMY      partial, ovaries remain, no cervix   bone spur Right    BREAST EXCISIONAL BIOPSY Left    BREAST SURGERY     benign breast cyst removal   CESAREAN SECTION     CHOLECYSTECTOMY     NASAL SINUS SURGERY     ovarian cyst removal     TUBAL LIGATION      Social History   Socioeconomic History   Marital status: Married    Spouse name: Not on file   Number of children: 2   Years of education: Not on file   Highest education level: Associate degree: occupational, Scientist, product/process development, or vocational program  Occupational History   Not on file  Tobacco Use   Smoking status: Former    Current packs/day: 0.00    Average packs/day: 2.0 packs/day for 12.0 years (24.0 ttl pk-yrs)    Types: Cigarettes, E-cigarettes    Start date: 02/03/2000    Quit date: 02/03/2012    Years since quitting:  11.9   Smokeless tobacco: Never  Vaping Use   Vaping status: Former   Devices: quit in july 2022  Substance and Sexual Activity   Alcohol use: Yes    Comment: rare   Drug use: No   Sexual activity: Yes    Birth control/protection: Surgical  Other Topics Concern   Not on file  Social History Narrative   Not on file   Social Drivers of Health   Financial Resource Strain: Low Risk  (07/10/2023)   Overall Financial Resource Strain (CARDIA)    Difficulty of Paying Living Expenses: Not hard at all  Food Insecurity: No Food Insecurity (07/10/2023)   Hunger Vital Sign    Worried About Running Out of Food in the Last Year: Never true    Ran Out of Food in the Last Year: Never true  Transportation Needs: No Transportation Needs (07/10/2023)   PRAPARE - Administrator, Civil Service (Medical): No    Lack of Transportation (Non-Medical): No  Physical Activity: Insufficiently Active (07/10/2023)   Exercise Vital Sign    Days of Exercise per Week: 2  days    Minutes of Exercise per Session: 20 min  Stress: No Stress Concern Present (07/10/2023)   [employer] of Occupational Health - Occupational Stress Questionnaire    Feeling of Stress : Only a little  Social Connections: Socially Integrated (07/10/2023)   Social Connection and Isolation Panel    Frequency of Communication with Friends and Family: Three times a week    Frequency of Social Gatherings with Friends and Family: Once a week    Attends Religious Services: More than 4 times per year    Active Member of [employer] or Organizations: No    Attends Engineer, structural: More than 4 times per year    Marital Status: Married  Catering manager Violence: Not on file    Family History  Problem Relation Age of Onset   Hypertension Mother    Hyperlipidemia Mother    Heart disease Mother    Diabetes Mother    Asthma Mother    Thyroid  disease Mother    Lupus Mother    Immunodeficiency Mother    Arthritis Mother     Heart disease Father    Hypertension Father    Cervical cancer Paternal Grandmother        cervical   Leukemia Paternal Grandmother    Diabetes Paternal Grandfather    Stroke Paternal Grandfather    Diabetes Paternal Aunt    Prostate cancer Paternal Uncle    Heart disease Paternal Uncle     No Known Allergies  Review of Systems  All other systems reviewed and are negative.      Objective:   BP 118/84   Pulse 79   Ht 5' 4 (1.626 m)   Wt 280 lb 12.8 oz (127.4 kg)   LMP 11/02/1997   SpO2 97%   BMI 48.20 kg/m   Vitals:   01/02/24 1504  BP: 118/84  Pulse: 79  Height: 5' 4 (1.626 m)  Weight: 280 lb 12.8 oz (127.4 kg)  SpO2: 97%  BMI (Calculated): 48.18    Physical Exam Vitals and nursing note reviewed.  Constitutional:      Appearance: Normal appearance. She is normal weight.  HENT:     Head: Normocephalic.  Eyes:     Extraocular Movements: Extraocular movements intact.     Conjunctiva/sclera: Conjunctivae normal.     Pupils: Pupils are equal, round, and reactive to light.  Cardiovascular:     Rate and Rhythm: Normal rate.  Pulmonary:     Effort: Pulmonary effort is normal.  Neurological:     General: No focal deficit present.     Mental Status: She is alert and oriented to person, place, and time. Mental status is at baseline.  Psychiatric:        Mood and Affect: Mood normal.        Behavior: Behavior normal.        Thought Content: Thought content normal.      No results found for any visits on 01/02/24.  Recent Results (from the past 2160 hours)  Lipid panel     Status: Abnormal   Collection Time: 11/14/23  2:40 PM  Result Value Ref Range   Cholesterol, Total 211 (H) 100 - 199 mg/dL   Triglycerides 830 (H) 0 - 149 mg/dL   HDL 67 >60 mg/dL   VLDL Cholesterol Cal 29 5 - 40 mg/dL   LDL Chol Calc (NIH) 884 (H) 0 - 99 mg/dL   Chol/HDL Ratio 3.1 0.0 - 4.4 ratio  Comment:                                   T. Chol/HDL Ratio                                              Men  Women                               1/2 Avg.Risk  3.4    3.3                                   Avg.Risk  5.0    4.4                                2X Avg.Risk  9.6    7.1                                3X Avg.Risk 23.4   11.0   VITAMIN D  25 Hydroxy (Vit-D Deficiency, Fractures)     Status: Abnormal   Collection Time: 11/14/23  2:40 PM  Result Value Ref Range   Vit D, 25-Hydroxy 26.7 (L) 30.0 - 100.0 ng/mL    Comment: Vitamin D  deficiency has been defined by the Institute of Medicine and an Endocrine Society practice guideline as a level of serum 25-OH vitamin D  less than 20 ng/mL (1,2). The Endocrine Society went on to further define vitamin D  insufficiency as a level between 21 and 29 ng/mL (2). 1. IOM (Institute of Medicine). 2010. Dietary reference    intakes for calcium and D. [place]: The    [employer]. 2. Holick MF, Binkley Mather, Bischoff-Ferrari HA, et al.    Evaluation, treatment, and prevention of vitamin D     deficiency: an Endocrine Society clinical practice    guideline. JCEM. 2011 Jul; 96(7):1911-30.   CMP14+EGFR     Status: Abnormal   Collection Time: 11/14/23  2:40 PM  Result Value Ref Range   Glucose 69 (L) 70 - 99 mg/dL   BUN 16 6 - 24 mg/dL   Creatinine, Ser 9.26 0.57 - 1.00 mg/dL   eGFR 99 >40 fO/fpw/8.26   BUN/Creatinine Ratio 22 9 - 23   Sodium 141 134 - 144 mmol/L   Potassium 3.5 3.5 - 5.2 mmol/L   Chloride 98 96 - 106 mmol/L   CO2 27 20 - 29 mmol/L   Calcium 9.7 8.7 - 10.2 mg/dL   Total Protein 6.5 6.0 - 8.5 g/dL   Albumin 4.4 3.8 - 4.9 g/dL   Globulin, Total 2.1 1.5 - 4.5 g/dL   Bilirubin Total 0.3 0.0 - 1.2 mg/dL   Alkaline Phosphatase 112 44 - 121 IU/L   AST 39 0 - 40 IU/L   ALT 52 (H) 0 - 32 IU/L  TSH     Status: None   Collection Time: 11/14/23  2:40 PM  Result Value Ref Range   TSH 1.050 0.450 - 4.500 uIU/mL  Hemoglobin A1c     Status: Abnormal   Collection Time:  11/14/23  2:40 PM  Result Value  Ref Range   Hgb A1c MFr Bld 5.8 (H) 4.8 - 5.6 %    Comment:          Prediabetes: 5.7 - 6.4          Diabetes: >6.4          Glycemic control for adults with diabetes: <7.0    Est. average glucose Bld gHb Est-mCnc 120 mg/dL  Vitamin B12     Status: None   Collection Time: 11/14/23  2:40 PM  Result Value Ref Range   Vitamin B-12 523 232 - 1,245 pg/mL  CBC with Diff     Status: Abnormal   Collection Time: 11/14/23  2:40 PM  Result Value Ref Range   WBC 7.3 3.4 - 10.8 x10E3/uL   RBC 5.02 3.77 - 5.28 x10E6/uL   Hemoglobin 14.9 11.1 - 15.9 g/dL   Hematocrit 53.7 65.9 - 46.6 %   MCV 92 79 - 97 fL   MCH 29.7 26.6 - 33.0 pg   MCHC 32.3 31.5 - 35.7 g/dL   RDW 85.3 88.2 - 84.5 %   Platelets 203 150 - 450 x10E3/uL   Neutrophils 43 Not Estab. %   Lymphs 44 Not Estab. %   Monocytes 8 Not Estab. %   Eos 4 Not Estab. %   Basos 1 Not Estab. %   Neutrophils Absolute 3.1 1.4 - 7.0 x10E3/uL   Lymphocytes Absolute 3.2 (H) 0.7 - 3.1 x10E3/uL   Monocytes Absolute 0.6 0.1 - 0.9 x10E3/uL   EOS (ABSOLUTE) 0.3 0.0 - 0.4 x10E3/uL   Basophils Absolute 0.1 0.0 - 0.2 x10E3/uL   Immature Granulocytes 0 Not Estab. %   Immature Grans (Abs) 0.0 0.0 - 0.1 x10E3/uL      Assessment & Plan OSA (obstructive sleep apnea) I will contact insurance company and see if we are able to get this appealed. The patient has been using her CPAP, but is still not sleeping well and would benefit from this as an alternative treatment.   Pure hypercholesterolemia Continue current therapy for lipid control. Will modify as needed based on labwork results.   Prediabetes A1C Continues to be in prediabetic ranges.  Will reassess at follow up after next lab check.  Patient counseled on dietary choices and verbalized understanding.   Morbid obesity (HCC) Continue current meds.  Will adjust as needed based on results.  The patient is asked to make an attempt to improve diet and exercise patterns to aid in medical management of  this problem. Addressed importance of increasing and maintaining water intake.      Return in about 2 months (around 03/03/2024).   Total time spent: 20 minutes  ALAN CHRISTELLA ARRANT, FNP  01/02/2024   This document may have been prepared by [facility] Voice Recognition software and as such may include unintentional dictation errors.

## 2024-01-02 NOTE — Assessment & Plan Note (Signed)
 A1C Continues to be in prediabetic ranges.  Will reassess at follow up after next lab check.  Patient counseled on dietary choices and verbalized understanding.

## 2024-01-02 NOTE — Assessment & Plan Note (Signed)
 I will contact insurance company and see if we are able to get this appealed. The patient has been using her CPAP, but is still not sleeping well and would benefit from this as an alternative treatment.

## 2024-01-14 ENCOUNTER — Encounter: Payer: Self-pay | Admitting: Family

## 2024-01-14 NOTE — Assessment & Plan Note (Signed)
 Starting patient on Zepbound .  2.5 MG samples given in office  today.  Will reassess at follow up.

## 2024-01-14 NOTE — Assessment & Plan Note (Signed)
 A1C Continues to be in prediabetic ranges.  Will reassess at follow up after next lab check.  Patient counseled on dietary choices and verbalized understanding.   -CBC w/Diff -CMP w/eGFR -Hemoglobin A1C

## 2024-01-14 NOTE — Assessment & Plan Note (Signed)
 Continue current meds.  Will adjust as needed based on results.  The patient is asked to make an attempt to improve diet and exercise patterns to aid in medical management of this problem. Addressed importance of increasing and maintaining water  intake.

## 2024-01-14 NOTE — Assessment & Plan Note (Signed)
 Checking labs today.  Will continue supplements as needed.   - Vitamin D  - Vitamin B12 - TSH

## 2024-01-14 NOTE — Assessment & Plan Note (Signed)
 Blood pressure well controlled with current medications.  Continue current therapy.  Will reassess at follow up.   - CBC w/Diff - CMP w/eGFR

## 2024-01-29 ENCOUNTER — Telehealth: Payer: Self-pay | Admitting: Family

## 2024-01-29 NOTE — Telephone Encounter (Signed)
 Pt informed. Pt states she will come this afternoon if not tomorrow morning.

## 2024-01-29 NOTE — Telephone Encounter (Signed)
 Patient left VM requesting another sample of the Mounjaro .   We do have some upstairs and she can come by and pick up a box.

## 2024-03-05 ENCOUNTER — Ambulatory Visit: Admitting: Family

## 2024-03-16 ENCOUNTER — Other Ambulatory Visit: Payer: Self-pay | Admitting: Family

## 2024-03-17 ENCOUNTER — Ambulatory Visit (INDEPENDENT_AMBULATORY_CARE_PROVIDER_SITE_OTHER): Admitting: Family

## 2024-03-17 VITALS — BP 132/82 | HR 81 | Ht 64.0 in | Wt 280.6 lb

## 2024-03-17 DIAGNOSIS — I10 Essential (primary) hypertension: Secondary | ICD-10-CM | POA: Diagnosis not present

## 2024-03-17 DIAGNOSIS — M170 Bilateral primary osteoarthritis of knee: Secondary | ICD-10-CM | POA: Diagnosis not present

## 2024-03-17 DIAGNOSIS — R7303 Prediabetes: Secondary | ICD-10-CM | POA: Diagnosis not present

## 2024-03-17 DIAGNOSIS — E782 Mixed hyperlipidemia: Secondary | ICD-10-CM

## 2024-03-17 DIAGNOSIS — E66813 Obesity, class 3: Secondary | ICD-10-CM

## 2024-03-17 DIAGNOSIS — G4733 Obstructive sleep apnea (adult) (pediatric): Secondary | ICD-10-CM

## 2024-03-17 DIAGNOSIS — E559 Vitamin D deficiency, unspecified: Secondary | ICD-10-CM

## 2024-03-17 DIAGNOSIS — H60503 Unspecified acute noninfective otitis externa, bilateral: Secondary | ICD-10-CM

## 2024-03-17 DIAGNOSIS — E538 Deficiency of other specified B group vitamins: Secondary | ICD-10-CM

## 2024-03-17 DIAGNOSIS — Z6841 Body Mass Index (BMI) 40.0 and over, adult: Secondary | ICD-10-CM

## 2024-03-17 MED ORDER — ACETIC ACID 2 % OT SOLN: 2.0000 [drp] | OTIC | 0 refills | Status: DC | PRN

## 2024-03-17 NOTE — Assessment & Plan Note (Signed)
-   Continue healthy diet and exercise as tolerated to promote weight loss. - Continue medications as prescribed. - Check labs today  - Use cpap nightly

## 2024-03-17 NOTE — Progress Notes (Signed)
 Established Patient Office Visit  Subjective:  Patient ID: Jody Jensen, female    DOB: 04-05-1971  Age: 53 y.o. MRN: [id number]  Chief Complaint  Patient presents with   Follow-up    2 month follow up    Patient is here today for her 2 months follow up.  She has been feeling well since last appointment.   She does have additional concerns to discuss today. Reports itching in her ears that is constant. She reports she has tried Vaseline without relief. She was wondering if it was related to menopause. She does have seasonal allergies. She reports taking Xyzal  and Singulair  daily. Will try otic ph drops  Patient wants update on weight loss injections PA as she has not heard anything to date. PA response shows that they do not cover anything for weight loss, OSA, or MASH.  Patient wants referral to orthopedics. Patient has known osteoarthritis L knee worse than R knee. She reports leg give out with prolonged ambulation; especially on uneven surfaces.   Labs are due today. She needs refills.   I have reviewed her active problem list, medication list, allergies, family history, social history, health maintenance, notes from last encounter, lab results for her appointment today.     No other concerns at this time.   Past Medical History:  Diagnosis Date   Allergy    As a child   Anxiety    Arthritis    In knees   Asthma    Diabetes mellitus without complication (HCC)    Hyperlipidemia    Hypertension    Obesity    OSA (obstructive sleep apnea)    Ovarian cyst    Sleep apnea     Past Surgical History:  Procedure Laterality Date   ABDOMINAL HYSTERECTOMY      partial, ovaries remain, no cervix   bone spur Right    BREAST EXCISIONAL BIOPSY Left    BREAST SURGERY     benign breast cyst removal   CESAREAN SECTION     CHOLECYSTECTOMY     NASAL SINUS SURGERY     ovarian cyst removal     TUBAL LIGATION      Social History   Socioeconomic History   Marital status:  Married    Spouse name: Not on file   Number of children: 2   Years of education: Not on file   Highest education level: Associate degree: occupational, Scientist, product/process development, or vocational program  Occupational History   Not on file  Tobacco Use   Smoking status: Former    Current packs/day: 0.00    Average packs/day: 2.0 packs/day for 12.0 years (24.0 ttl pk-yrs)    Types: Cigarettes, E-cigarettes    Start date: 02/03/2000    Quit date: 02/03/2012    Years since quitting: 12.1   Smokeless tobacco: Never  Vaping Use   Vaping status: Former   Devices: quit in july 2022  Substance and Sexual Activity   Alcohol use: Yes    Comment: rare   Drug use: No   Sexual activity: Yes    Birth control/protection: Surgical  Other Topics Concern   Not on file  Social History Narrative   Not on file   Social Drivers of Health   Financial Resource Strain: Low Risk  (07/10/2023)   Overall Financial Resource Strain (CARDIA)    Difficulty of Paying Living Expenses: Not hard at all  Food Insecurity: No Food Insecurity (07/10/2023)   Hunger Vital Sign    Worried  About Running Out of Food in the Last Year: Never true    Ran Out of Food in the Last Year: Never true  Transportation Needs: No Transportation Needs (07/10/2023)   PRAPARE - Administrator, Civil Service (Medical): No    Lack of Transportation (Non-Medical): No  Physical Activity: Insufficiently Active (07/10/2023)   Exercise Vital Sign    Days of Exercise per Week: 2 days    Minutes of Exercise per Session: 20 min  Stress: No Stress Concern Present (07/10/2023)   [employer] of Occupational Health - Occupational Stress Questionnaire    Feeling of Stress : Only a little  Social Connections: Socially Integrated (07/10/2023)   Social Connection and Isolation Panel    Frequency of Communication with Friends and Family: Three times a week    Frequency of Social Gatherings with Friends and Family: Once a week    Attends Religious Services:  More than 4 times per year    Active Member of [employer] or Organizations: No    Attends Engineer, structural: More than 4 times per year    Marital Status: Married  Catering manager Violence: Not on file    Family History  Problem Relation Age of Onset   Hypertension Mother    Hyperlipidemia Mother    Heart disease Mother    Diabetes Mother    Asthma Mother    Thyroid  disease Mother    Lupus Mother    Immunodeficiency Mother    Arthritis Mother    Heart disease Father    Hypertension Father    Cervical cancer Paternal Grandmother        cervical   Leukemia Paternal Grandmother    Diabetes Paternal Grandfather    Stroke Paternal Grandfather    Diabetes Paternal Aunt    Prostate cancer Paternal Uncle    Heart disease Paternal Uncle     No Known Allergies  Review of Systems  Constitutional:  Negative for malaise/fatigue.  HENT: Negative.    Eyes:  Negative for blurred vision and pain.  Respiratory:  Negative for cough and shortness of breath.   Cardiovascular:  Negative for chest pain, palpitations, claudication and leg swelling.  Gastrointestinal:  Negative for abdominal pain, blood in stool, constipation, diarrhea, nausea and vomiting.  Genitourinary:  Negative for dysuria, frequency and urgency.  Musculoskeletal: Negative.   Skin: Negative.   Neurological:  Negative for dizziness, tingling, sensory change and headaches.  Endo/Heme/Allergies: Negative.   Psychiatric/Behavioral: Negative.         Objective:   BP 132/82   Pulse 81   Ht 5' 4 (1.626 m)   Wt 280 lb 9.6 oz (127.3 kg)   LMP 11/02/1997   SpO2 97%   BMI 48.16 kg/m   Vitals:   03/17/24 1453  BP: 132/82  Pulse: 81  Height: 5' 4 (1.626 m)  Weight: 280 lb 9.6 oz (127.3 kg)  SpO2: 97%  BMI (Calculated): 48.14    Physical Exam Vitals and nursing note reviewed.  Constitutional:      Appearance: Normal appearance.  HENT:     Head: Normocephalic.  Eyes:     Extraocular Movements:  Extraocular movements intact.     Pupils: Pupils are equal, round, and reactive to light.  Cardiovascular:     Rate and Rhythm: Normal rate and regular rhythm.     Pulses: Normal pulses.     Heart sounds: Normal heart sounds. No murmur heard. Pulmonary:     Effort: Pulmonary effort  is normal. No respiratory distress.     Breath sounds: Normal breath sounds.  Abdominal:     General: There is no distension.     Tenderness: There is no abdominal tenderness.  Musculoskeletal:        General: No tenderness. Normal range of motion.     Cervical back: Normal range of motion and neck supple.     Right lower leg: No edema.     Left lower leg: No edema.  Skin:    General: Skin is warm and dry.     Coloration: Skin is not jaundiced.     Findings: No erythema.  Neurological:     General: No focal deficit present.     Mental Status: She is alert and oriented to person, place, and time.  Psychiatric:        Mood and Affect: Mood normal.        Speech: Speech normal.        Behavior: Behavior is cooperative.        Cognition and Memory: Memory is not impaired.      No results found for any visits on 03/17/24.  No results found for this or any previous visit (from the past 2160 hours).     Assessment & Plan Primary osteoarthritis of both knees - Continue healthy diet and exercise as tolerated. - Check labs today  - Referral sent to orthopedics. Class 3 severe obesity due to excess calories with serious comorbidity and body mass index (BMI) of 45.0 to 49.9 in adult [facility]) Prediabetes Primary hypertension Mixed hyperlipidemia OSA (obstructive sleep apnea) Class 3 severe obesity due to excess calories with body mass index (BMI) of 45.0 to 49.9 in adult [facility]) - Continue healthy diet and exercise as tolerated to promote weight loss. - Continue medications as prescribed. - Check labs today  - Use cpap nightly B12 deficiency due to diet Vitamin D  deficiency, unspecified - Check labs  today - Supplementation recommended based off lab results and will notify patient at that time     Return in about 3 months (around 06/17/2024).   Total time spent: 25 minutes; This includes chart review and face to face interactions during the visit today.  Oddis DELENA Cain, FNP  03/17/2024   This document may have been prepared by [facility] Voice Recognition software and as such may include unintentional dictation errors.

## 2024-03-17 NOTE — Assessment & Plan Note (Signed)
-   Continue healthy diet and exercise as tolerated. - Check labs today  - Referral sent to orthopedics.

## 2024-03-18 LAB — CMP14+EGFR
ALT: 71 IU/L — ABNORMAL HIGH (ref 0–32)
AST: 52 IU/L — ABNORMAL HIGH (ref 0–40)
Albumin: 4.1 g/dL (ref 3.8–4.9)
Alkaline Phosphatase: 124 IU/L (ref 49–135)
BUN/Creatinine Ratio: 19 (ref 9–23)
BUN: 14 mg/dL (ref 6–24)
Bilirubin Total: 0.3 mg/dL (ref 0.0–1.2)
CO2: 32 mmol/L — ABNORMAL HIGH (ref 20–29)
Calcium: 9.6 mg/dL (ref 8.7–10.2)
Chloride: 98 mmol/L (ref 96–106)
Creatinine, Ser: 0.72 mg/dL (ref 0.57–1.00)
Globulin, Total: 2 g/dL (ref 1.5–4.5)
Glucose: 90 mg/dL (ref 70–99)
Potassium: 3.1 mmol/L — ABNORMAL LOW (ref 3.5–5.2)
Sodium: 142 mmol/L (ref 134–144)
Total Protein: 6.1 g/dL (ref 6.0–8.5)
eGFR: 101 mL/min/1.73 (ref >59)

## 2024-03-18 LAB — LIPID PANEL
Chol/HDL Ratio: 3.4 ratio (ref 0.0–4.4)
Cholesterol, Total: 203 mg/dL — ABNORMAL HIGH (ref 100–199)
HDL: 59 mg/dL (ref >39)
LDL Chol Calc (NIH): 119 mg/dL — ABNORMAL HIGH (ref 0–99)
Triglycerides: 143 mg/dL (ref 0–149)
VLDL Cholesterol Cal: 25 mg/dL (ref 5–40)

## 2024-03-18 LAB — CBC WITH DIFFERENTIAL/PLATELET
Basophils Absolute: 0 x10E3/uL (ref 0.0–0.2)
Basos: 1 %
EOS (ABSOLUTE): 0.2 x10E3/uL (ref 0.0–0.4)
Eos: 3 %
Hematocrit: 45.1 % (ref 34.0–46.6)
Hemoglobin: 14.1 g/dL (ref 11.1–15.9)
Immature Grans (Abs): 0 x10E3/uL (ref 0.0–0.1)
Immature Granulocytes: 0 %
Lymphocytes Absolute: 2.5 x10E3/uL (ref 0.7–3.1)
Lymphs: 39 %
MCH: 28.3 pg (ref 26.6–33.0)
MCHC: 31.3 g/dL — ABNORMAL LOW (ref 31.5–35.7)
MCV: 91 fL (ref 79–97)
Monocytes Absolute: 0.5 x10E3/uL (ref 0.1–0.9)
Monocytes: 8 %
Neutrophils Absolute: 3.2 x10E3/uL (ref 1.4–7.0)
Neutrophils: 49 %
Platelets: 210 x10E3/uL (ref 150–450)
RBC: 4.98 x10E6/uL (ref 3.77–5.28)
RDW: 14 % (ref 11.7–15.4)
WBC: 6.4 x10E3/uL (ref 3.4–10.8)

## 2024-03-18 LAB — HEMOGLOBIN A1C
Est. average glucose Bld gHb Est-mCnc: 111 mg/dL
Hgb A1c MFr Bld: 5.5 % (ref 4.8–5.6)

## 2024-03-18 LAB — VITAMIN D 25 HYDROXY (VIT D DEFICIENCY, FRACTURES): Vit D, 25-Hydroxy: 27.3 ng/mL — ABNORMAL LOW (ref 30.0–100.0)

## 2024-03-18 LAB — SEDIMENTATION RATE: Sed Rate: 16 mm/h (ref 0–40)

## 2024-03-18 LAB — VITAMIN B12: Vitamin B-12: 475 pg/mL (ref 232–1245)

## 2024-03-20 ENCOUNTER — Encounter: Payer: Self-pay | Admitting: Family

## 2024-04-01 ENCOUNTER — Encounter: Payer: Self-pay | Admitting: Family

## 2024-04-02 ENCOUNTER — Emergency Department (HOSPITAL_COMMUNITY)

## 2024-04-02 ENCOUNTER — Inpatient Hospital Stay (HOSPITAL_COMMUNITY)
Admission: EM | Admit: 2024-04-02 | Discharge: 2024-04-04 | DRG: 445 | Disposition: A | Discharge status: Home / Self Care | Attending: Internal Medicine | Admitting: Internal Medicine

## 2024-04-02 ENCOUNTER — Inpatient Hospital Stay (HOSPITAL_COMMUNITY)

## 2024-04-02 DIAGNOSIS — J45909 Unspecified asthma, uncomplicated: Secondary | ICD-10-CM | POA: Diagnosis present

## 2024-04-02 DIAGNOSIS — Z8249 Family history of ischemic heart disease and other diseases of the circulatory system: Secondary | ICD-10-CM

## 2024-04-02 DIAGNOSIS — K8040 Calculus of bile duct with cholecystitis, unspecified, without obstruction: Secondary | ICD-10-CM | POA: Diagnosis present

## 2024-04-02 DIAGNOSIS — Z833 Family history of diabetes mellitus: Secondary | ICD-10-CM

## 2024-04-02 DIAGNOSIS — Z90710 Acquired absence of both cervix and uterus: Secondary | ICD-10-CM | POA: Diagnosis not present

## 2024-04-02 DIAGNOSIS — R1013 Epigastric pain: Principal | ICD-10-CM

## 2024-04-02 DIAGNOSIS — R1011 Right upper quadrant pain: Secondary | ICD-10-CM

## 2024-04-02 DIAGNOSIS — Z7985 Long-term (current) use of injectable non-insulin antidiabetic drugs: Secondary | ICD-10-CM

## 2024-04-02 DIAGNOSIS — G4733 Obstructive sleep apnea (adult) (pediatric): Secondary | ICD-10-CM | POA: Diagnosis present

## 2024-04-02 DIAGNOSIS — Z9049 Acquired absence of other specified parts of digestive tract: Secondary | ICD-10-CM | POA: Diagnosis not present

## 2024-04-02 DIAGNOSIS — R7989 Other specified abnormal findings of blood chemistry: Secondary | ICD-10-CM | POA: Diagnosis present

## 2024-04-02 DIAGNOSIS — E119 Type 2 diabetes mellitus without complications: Secondary | ICD-10-CM | POA: Diagnosis present

## 2024-04-02 DIAGNOSIS — K760 Fatty (change of) liver, not elsewhere classified: Secondary | ICD-10-CM | POA: Diagnosis present

## 2024-04-02 DIAGNOSIS — K8050 Calculus of bile duct without cholangitis or cholecystitis without obstruction: Secondary | ICD-10-CM

## 2024-04-02 DIAGNOSIS — Z825 Family history of asthma and other chronic lower respiratory diseases: Secondary | ICD-10-CM

## 2024-04-02 DIAGNOSIS — Z87891 Personal history of nicotine dependence: Secondary | ICD-10-CM | POA: Diagnosis not present

## 2024-04-02 DIAGNOSIS — F39 Unspecified mood [affective] disorder: Secondary | ICD-10-CM | POA: Diagnosis present

## 2024-04-02 DIAGNOSIS — Z6841 Body Mass Index (BMI) 40.0 and over, adult: Secondary | ICD-10-CM

## 2024-04-02 DIAGNOSIS — Z83438 Family history of other disorder of lipoprotein metabolism and other lipidemia: Secondary | ICD-10-CM | POA: Diagnosis not present

## 2024-04-02 DIAGNOSIS — Z79899 Other long term (current) drug therapy: Secondary | ICD-10-CM

## 2024-04-02 DIAGNOSIS — E785 Hyperlipidemia, unspecified: Secondary | ICD-10-CM | POA: Diagnosis present

## 2024-04-02 DIAGNOSIS — F419 Anxiety disorder, unspecified: Secondary | ICD-10-CM | POA: Diagnosis present

## 2024-04-02 DIAGNOSIS — I10 Essential (primary) hypertension: Secondary | ICD-10-CM | POA: Diagnosis present

## 2024-04-02 DIAGNOSIS — R739 Hyperglycemia, unspecified: Secondary | ICD-10-CM

## 2024-04-02 DIAGNOSIS — E876 Hypokalemia: Secondary | ICD-10-CM | POA: Diagnosis present

## 2024-04-02 DIAGNOSIS — M170 Bilateral primary osteoarthritis of knee: Secondary | ICD-10-CM | POA: Diagnosis present

## 2024-04-02 DIAGNOSIS — I272 Pulmonary hypertension, unspecified: Secondary | ICD-10-CM | POA: Diagnosis present

## 2024-04-02 DIAGNOSIS — Z8261 Family history of arthritis: Secondary | ICD-10-CM

## 2024-04-02 DIAGNOSIS — R109 Unspecified abdominal pain: Secondary | ICD-10-CM | POA: Diagnosis present

## 2024-04-02 DIAGNOSIS — R0789 Other chest pain: Secondary | ICD-10-CM | POA: Diagnosis present

## 2024-04-02 LAB — MAGNESIUM: Magnesium: 1.8 mg/dL (ref 1.7–2.4)

## 2024-04-02 LAB — HEPATITIS B SURFACE ANTIBODY,QUALITATIVE: Hep B S Ab: NONREACTIVE

## 2024-04-02 LAB — COMPREHENSIVE METABOLIC PANEL WITH GFR
ALT: 266 U/L — ABNORMAL HIGH (ref 0–44)
AST: 328 U/L — ABNORMAL HIGH (ref 15–41)
Albumin: 3.5 g/dL (ref 3.5–5.0)
Alkaline Phosphatase: 126 U/L (ref 38–126)
Anion gap: 15 (ref 5–15)
BUN: 11 mg/dL (ref 6–20)
CO2: 27 mmol/L (ref 22–32)
Calcium: 8.8 mg/dL — ABNORMAL LOW (ref 8.9–10.3)
Chloride: 94 mmol/L — ABNORMAL LOW (ref 98–111)
Creatinine, Ser: 0.92 mg/dL (ref 0.44–1.00)
GFR, Estimated: >60 mL/min (ref >60)
Glucose, Bld: 155 mg/dL — ABNORMAL HIGH (ref 70–99)
Potassium: 2.9 mmol/L — ABNORMAL LOW (ref 3.5–5.1)
Sodium: 136 mmol/L (ref 135–145)
Total Bilirubin: 2.5 mg/dL — ABNORMAL HIGH (ref 0.0–1.2)
Total Protein: 6.4 g/dL — ABNORMAL LOW (ref 6.5–8.1)

## 2024-04-02 LAB — BILIRUBIN, FRACTIONATED(TOT/DIR/INDIR)
Bilirubin, Direct: 2.5 mg/dL — ABNORMAL HIGH (ref 0.0–0.2)
Indirect Bilirubin: 1.7 mg/dL — ABNORMAL HIGH (ref 0.3–0.9)
Total Bilirubin: 4.2 mg/dL — ABNORMAL HIGH (ref 0.0–1.2)

## 2024-04-02 LAB — HIV ANTIBODY (ROUTINE TESTING W REFLEX): HIV Screen 4th Generation wRfx: NONREACTIVE

## 2024-04-02 LAB — CBC WITH DIFFERENTIAL/PLATELET
Abs Immature Granulocytes: 0.04 K/uL (ref 0.00–0.07)
Basophils Absolute: 0 K/uL (ref 0.0–0.1)
Basophils Relative: 0 %
Eosinophils Absolute: 0 K/uL (ref 0.0–0.5)
Eosinophils Relative: 0 %
HCT: 42.4 % (ref 36.0–46.0)
Hemoglobin: 14.4 g/dL (ref 12.0–15.0)
Immature Granulocytes: 0 %
Lymphocytes Relative: 5 %
Lymphs Abs: 0.5 K/uL — ABNORMAL LOW (ref 0.7–4.0)
MCH: 29.6 pg (ref 26.0–34.0)
MCHC: 34 g/dL (ref 30.0–36.0)
MCV: 87.1 fL (ref 80.0–100.0)
Monocytes Absolute: 0.5 K/uL (ref 0.1–1.0)
Monocytes Relative: 5 %
Neutro Abs: 9.6 K/uL — ABNORMAL HIGH (ref 1.7–7.7)
Neutrophils Relative %: 90 %
Platelets: 163 K/uL (ref 150–400)
RBC: 4.87 MIL/uL (ref 3.87–5.11)
RDW: 14.5 % (ref 11.5–15.5)
WBC: 10.8 K/uL — ABNORMAL HIGH (ref 4.0–10.5)
nRBC: 0 % (ref 0.0–0.2)

## 2024-04-02 LAB — PROTIME-INR
INR: 1.1 (ref 0.8–1.2)
Prothrombin Time: 14.9 s (ref 11.4–15.2)

## 2024-04-02 LAB — HEPATITIS A ANTIBODY, TOTAL: hep A Total Ab: REACTIVE — AB

## 2024-04-02 LAB — HEPATITIS C ANTIBODY: HCV Ab: NONREACTIVE

## 2024-04-02 LAB — HEPATITIS B SURFACE ANTIGEN: Hepatitis B Surface Ag: NONREACTIVE

## 2024-04-02 LAB — TROPONIN I (HIGH SENSITIVITY)
Troponin I (High Sensitivity): 9 ng/L (ref <18)
Troponin I (High Sensitivity): 7 ng/L (ref <18)

## 2024-04-02 LAB — LIPASE, BLOOD: Lipase: 27 U/L (ref 11–51)

## 2024-04-02 MED ORDER — GADOBUTROL 1 MMOL/ML IV SOLN
10.0000 mL | Freq: Once | INTRAVENOUS | Status: AC | PRN
Start: 2024-04-02 — End: 2024-04-02
  Administered 2024-04-02: 10 mL via INTRAVENOUS

## 2024-04-02 MED ORDER — HYDROCHLOROTHIAZIDE 25 MG PO TABS
50.0000 mg | ORAL_TABLET | Freq: Every day | ORAL | Status: DC
  Administered 2024-04-02 – 2024-04-03 (×2): 50 mg via ORAL
  Filled 2024-04-02 (×2): qty 2

## 2024-04-02 MED ORDER — IOHEXOL 350 MG/ML SOLN
100.0000 mL | Freq: Once | INTRAVENOUS | Status: AC | PRN
  Administered 2024-04-02: 100 mL via INTRAVENOUS

## 2024-04-02 MED ORDER — POTASSIUM CHLORIDE 10 MEQ/100ML IV SOLN
10.0000 meq | Freq: Once | INTRAVENOUS | Status: AC
  Administered 2024-04-02: 10 meq via INTRAVENOUS
  Filled 2024-04-02: qty 100

## 2024-04-02 MED ORDER — MORPHINE SULFATE (PF) 4 MG/ML IV SOLN
4.0000 mg | Freq: Once | INTRAVENOUS | Status: AC
  Administered 2024-04-02: 4 mg via INTRAVENOUS
  Filled 2024-04-02: qty 1

## 2024-04-02 MED ORDER — VENLAFAXINE HCL ER 75 MG PO CP24
150.0000 mg | ORAL_CAPSULE | Freq: Every day | ORAL | Status: DC
  Administered 2024-04-03: 150 mg via ORAL
  Filled 2024-04-02 (×2): qty 2

## 2024-04-02 MED ORDER — ASPIRIN 81 MG PO CHEW
324.0000 mg | CHEWABLE_TABLET | Freq: Once | ORAL | Status: DC
Start: 2024-04-02 — End: 2024-04-03

## 2024-04-02 MED ORDER — LEVOCETIRIZINE DIHYDROCHLORIDE 5 MG PO TABS: 5.0000 mg | ORAL_TABLET | Freq: Every morning | ORAL | Status: DC

## 2024-04-02 MED ORDER — POTASSIUM CHLORIDE CRYS ER 20 MEQ PO TBCR
40.0000 meq | EXTENDED_RELEASE_TABLET | Freq: Once | ORAL | Status: AC
  Administered 2024-04-02: 40 meq via ORAL
  Filled 2024-04-02: qty 2

## 2024-04-02 MED ORDER — SODIUM CHLORIDE 0.9 % IV SOLN: INTRAVENOUS | Status: AC

## 2024-04-02 MED ORDER — MORPHINE SULFATE (PF) 2 MG/ML IV SOLN: 1.0000 mg | INTRAVENOUS | Status: DC | PRN

## 2024-04-02 MED ORDER — ENOXAPARIN SODIUM 40 MG/0.4ML IJ SOSY
40.0000 mg | PREFILLED_SYRINGE | INTRAMUSCULAR | Status: DC
  Administered 2024-04-02 – 2024-04-03 (×2): 40 mg via SUBCUTANEOUS
  Filled 2024-04-02 (×2): qty 0.4

## 2024-04-02 MED ORDER — LORATADINE 10 MG PO TABS
10.0000 mg | ORAL_TABLET | Freq: Every day | ORAL | Status: DC
  Administered 2024-04-02 – 2024-04-03 (×2): 10 mg via ORAL
  Filled 2024-04-02 (×3): qty 1

## 2024-04-02 NOTE — ED Notes (Signed)
 Pt ambulatory to bathroom independently

## 2024-04-02 NOTE — ED Provider Notes (Addendum)
 [place] EMERGENCY DEPARTMENT AT [facility] Provider Note   CSN: [id number] Arrival date & time: 04/02/24  9651     Patient presents with: Chest Pain (10/10 chest pain started while cooking dinner felt like pressure in the sternum and to the back. Resolved after about an hour. Woke her up out of sleep around 2am and she came to ED)   Jody Jensen is a 53 y.o. female.   The history is provided by the patient.  Chest Pain  She has history of hypertension, asthma and comes in because of 2 episodes of chest pain radiating to the back.  She had 1 episode yesterday afternoon while cooking dinner.  Pain was in the lower sternal area and radiated through to the back.  She described a pressure feeling in the anterior chest and sharp pain in the back.  There is no associated dyspnea, nausea, diaphoresis.  Pain resolved within about 45 minutes.  She had a second episode of pain which woke her up and was very similar and has also resolved within about 45 minutes.  She is currently pain-free.  She states that there was mild dyspnea, nausea, diaphoresis.  She is a former smoker.  There is no known family history of premature coronary atherosclerosis.  She denies history of diabetes or hyperlipidemia.    Prior to Admission medications   Medication Sig Start Date End Date Taking? Authorizing Provider  acetic acid 2 % otic solution Place 2 drops into both ears every 3 (three) hours as needed. 03/17/24   Adine Oddis LABOR, FNP  albuterol  (ACCUNEB ) 0.63 MG/3ML nebulizer solution Take 1 ampule by nebulization every 6 (six) hours as needed for wheezing.    [provider]  albuterol  (VENTOLIN  HFA) 108 (90 Base) MCG/ACT inhaler Inhale 1-2 puffs into the lungs every 6 (six) hours as needed. 11/28/23   Orlean Alan HERO, FNP  famotidine  (PEPCID ) 20 MG tablet Take 1 tablet (20 mg total) by mouth at bedtime. 11/28/23   Orlean Alan HERO, FNP  hydrochlorothiazide  (HYDRODIURIL ) 50 MG tablet  Take 1 tablet by mouth once daily 03/16/24   Shirley, Amanda M, FNP  levocetirizine (XYZAL ) 5 MG tablet Take 1 tablet (5 mg total) by mouth every morning. 11/28/23   Orlean Alan HERO, FNP  montelukast  (SINGULAIR ) 10 MG tablet Take 1 tablet (10 mg total) by mouth as needed. 11/28/23   Orlean Alan HERO, FNP  tirzepatide  (ZEPBOUND ) 5 MG/0.5ML Pen Inject 5 mg into the skin once a week. Patient not taking: Reported on 03/17/2024 11/14/23   Orlean Alan HERO, FNP  venlafaxine  XR (EFFEXOR -XR) 150 MG 24 hr capsule Take 1 capsule (150 mg total) by mouth daily with breakfast. 11/28/23   Orlean Alan HERO, FNP  Vitamin D , Ergocalciferol , (DRISDOL ) 1.25 MG (50000 UNIT) CAPS capsule Take 1 capsule (50,000 Units total) by mouth once a week. 11/28/23   Orlean Alan HERO, FNP    Allergies: Patient has no known allergies.    Review of Systems  Cardiovascular:  Positive for chest pain.  All other systems reviewed and are negative.   Updated Vital Signs BP 111/68 (BP Location: Right Arm)   Pulse 100   Temp (!) 97.5 F (36.4 C) (Oral)   Resp 14   LMP 11/02/1997   SpO2 100%   Physical Exam Vitals and nursing note reviewed.   53 year old female, resting comfortably and in no acute distress. Vital signs are normal. Oxygen saturation is 100%, which is normal. Head is normocephalic  and atraumatic. PERRLA, EOMI. Oropharynx is clear. Neck is nontender and supple without adenopathy or JVD. Back is nontender and there is no CVA tenderness. Lungs are clear without rales, wheezes, or rhonchi. Chest is nontender. Heart has regular rate and rhythm without murmur. Abdomen is soft, flat, nontender. Extremities have no cyanosis or edema, full range of motion is present. Skin is warm and dry without rash. Neurologic: Mental status is normal, cranial nerves are intact, moves all extremities equally.  (all labs ordered are listed, but only abnormal results are displayed) Labs Reviewed  COMPREHENSIVE METABOLIC PANEL  WITH GFR - Abnormal; Notable for the following components:      Result Value   Potassium 2.9 (*)    Chloride 94 (*)    Glucose, Bld 155 (*)    Calcium 8.8 (*)    Total Protein 6.4 (*)    AST 328 (*)    ALT 266 (*)    Total Bilirubin 2.5 (*)    All other components within normal limits  CBC WITH DIFFERENTIAL/PLATELET - Abnormal; Notable for the following components:   WBC 10.8 (*)    Neutro Abs 9.6 (*)    Lymphs Abs 0.5 (*)    All other components within normal limits  LIPASE, BLOOD  MAGNESIUM  TROPONIN I (HIGH SENSITIVITY)  TROPONIN I (HIGH SENSITIVITY)    EKG: None  Radiology: CT Angio Chest/Abd/Pel for Dissection W and/or Wo Contrast Result Date: 04/02/2024 EXAM: CTA CHEST, ABDOMEN AND PELVIS WITH AND WITHOUT CONTRAST 04/02/2024 05:45:45 AM TECHNIQUE: CTA of the chest was performed with and without the administration of intravenous contrast. CTA of the abdomen and pelvis was performed with the administration of intravenous contrast. Multiplanar reformatted images are provided for review. MIP images are provided for review. Automated exposure control, iterative reconstruction, and/or weight based adjustment of the mA/kV was utilized to reduce the radiation dose to as low as reasonably achievable. COMPARISON: PA and lateral chest 12/29/2020, and partial chest CT with contrast for heart morphology and coronary CTA dated 05/12/2020. CLINICAL HISTORY: Sternal chest pain radiating posteriorly 2 episodes beginning yesterday evening and recurring this morning. Aortic aneurysm or dissection suspected. FINDINGS: VASCULATURE: AORTA: The thoracic aorta is normal in course and caliber. There is no aneurysm, stenosis, or dissection. There is trace calcific plaque in the proximal descending aorta, but no other thoracic aortic calcification. There are no calcifications in the abdominal aorta. No abdominal aortic aneurysm. PULMONARY ARTERIES: The pulmonary trunk measures prominent, 3.3 cm suggesting  arterial hypertension, was previously 2.6 cm. No arterial embolism is seen to the segmental level. GREAT VESSELS OF AORTIC ARCH: No acute finding. No dissection. No arterial occlusion or significant stenosis. CELIAC TRUNK: No acute finding. No occlusion or significant stenosis. SUPERIOR MESENTERIC ARTERY: No acute finding. No occlusion or significant stenosis. INFERIOR MESENTERIC ARTERY: No acute finding. No occlusion or significant stenosis. RENAL ARTERIES: No acute finding. No occlusion or significant stenosis. ILIAC ARTERIES: No acute finding. No occlusion or significant stenosis. CHEST: MEDIASTINUM: No mediastinal lymphadenopathy. The heart and pericardium demonstrate no acute abnormality. LUNGS AND PLEURA: There is coarse chronic linear scarring in the lingular base and a small amount also chronically in the medial right middle lobe. Additional linear scarring anteriorly in the right upper lobe. There are 2 chronic fissural nodules each measuring 6 mm in the right mid lung on axial 63 of series 7, stable, most likely intrapulmonary lymph nodes. No follow-up imaging is recommended. There is mild chronic elevation of the right hemidiaphragm. There are  no further nodules. There is no consolidation, effusion, or pneumothorax. THORACIC BONES AND SOFT TISSUES: No acute bone or soft tissue abnormality. ABDOMEN AND PELVIS: LIVER: The liver is 18 cm in length, moderately steatotic with a cirrhotic configuration and capsular nodularity over portions consistent with cirrhosis. There is a prominent hepatic portal main vein measuring 16.3 mm. There is no mass enhancement throughout the liver. GALLBLADDER AND BILE DUCTS: The patient is status post cholecystectomy. There is a prominent common bile duct measuring 12 mm without a visible filling defect. This is probably physiologic related to the cholecystectomy. There is mild intrahepatic bile duct dilatation. Pneumobilia is also noted, most likely due to prior sphincterotomy,  occasionally may be seen with infection. SPLEEN: The spleen is mildly prominent, measuring 13.3 cm, without focal mass. PANCREAS: The pancreas is unremarkable. ADRENAL GLANDS: Bilateral adrenal glands demonstrate no acute abnormality. KIDNEYS, URETERS AND BLADDER: No stones in the kidneys or ureters. No hydronephrosis. No perinephric or periureteral stranding. Urinary bladder is unremarkable. GI AND BOWEL: Stomach and duodenal sweep demonstrate no acute abnormality. There is no bowel obstruction. No abnormal bowel wall thickening or distension. There is colonic diverticulosis with no evidence of colitis or diverticulitis. The appendix is normal. REPRODUCTIVE: Uterus is absent with no adnexal mass. PERITONEUM AND RETROPERITONEUM: No ascites or free air. LYMPH NODES: No lymphadenopathy. ABDOMINAL BONES AND SOFT TISSUES: There are L5 pars defects with chronic appearance, chronic L5-S1 disc collapse, and a 9 mm grade 1 L5-S1 spondylolisthesis with severe secondary foraminal stenosis. No acute or other significant osseous findings. There is umbilical level rectal diastasis and a small umbilical hernia containing fat. There is no incarcerated hernia. No acute soft tissue abnormality. IMPRESSION: 1. No evidence of aortic aneurysm or dissection. 2. Prominent pulmonary trunk measuring 3.3 cm, increased from 2.6 cm, suggesting pulmonary arterial hypertension. No pulmonary embolism to the segmental level. 3. Moderately steatotic liver with cirrhotic configuration and capsular nodularity, slight splenomegaly and prominence in the hepatic portal vein . No hepatic mass enhancement. Mild intrahepatic bile duct dilatation and pneumobilia, likely post-sphincterotomy. 4. Colonic diverticulosis without evidence of colitis or diverticulitis. 5. 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 Electronically signed by: Francis Quam MD  04/02/2024 06:26 AM EDT RP Workstation: [id number]     Procedures   Medications Ordered in the ED - No data to display                                  Medical Decision Making Amount and/or Complexity of Data Reviewed Labs: ordered. Radiology: ordered.  Risk OTC drugs. Prescription drug management. Decision regarding hospitalization.   Chest pain radiating to the back.  This a presentation with wide range of treatment options and carries with it high risk of morbidity and complications.  Differential diagnosis includes, but is not limited to, ACS, GERD, esophageal spasm, pancreatitis, peptic ulcer disease, aortic dissection, pneumonia.  I have reviewed her past records, and note's CT coronary angiogram on 05/12/2020 showing no coronary artery calcifications and no plaque in the coronary arteries.  I noted cardiology office visit on 04/20/2020 for evaluation of chest pain and description is very similar to what she told me tonight.  I have ordered screening labs, initial dose of aspirin , and CT angiogram of chest/abdomen/pelvis to rule out aortic dissection.   I have reviewed her laboratory tests, and my  interpretation is moderate to severe hypokalemia, elevated random glucose level, elevated transaminases and bilirubin with normal lipase and alkaline phosphatase, normal troponin, mild leukocytosis.  Patient had told me that she developed common bile duct stones after her cholecystectomy, this is likely the source of her pain.  I have ordered oral and intravenous potassium and ordered a magnesium level.  CT angiogram showed no aortic dissection or aneurysm, mild intrahepatic bile duct dilatation and pneumobilia, dilated common bile duct without visible filling defect.  Have independently viewed the images, and agree with the radiologist's interpretation.  I have ordered right upper quadrant ultrasound, will need to follow with MRCP if it does not show clear choledocholithiasis.  She will need  to be admitted.  I have paged hospitalist for admission and gastroenterologist for consultation, neither have called back at this point.  Final diagnoses:  Epigastric pain  Elevated liver function tests  Hypokalemia  Elevated random blood glucose level    ED Discharge Orders     None          Raford Lenis, MD 04/02/24 TORRANCE    Raford Lenis, MD 04/02/24 [phone]

## 2024-04-02 NOTE — Consult Note (Addendum)
 Consultation  Referring Provider:  [facility]  Primary Care Physician:  Orlean Alan HERO, FNP Primary Gastroenterologist:  Dr. Legrand       Reason for Consultation: Upper abdominal pain and elevated LFTs     LOS: 0 days          HPI:   Jody Jensen is a 53 y.o. female with past medical history significant for morbid obesity, anxiety, asthma, sleep apnea, diabetes type 2, presents for evaluation of elevated LFTs and upper abdominal pain  History of cholecystectomy secondary to gallstones 1989 s/p ERCP with sphincterotomy and stone extraction in 1993 in 1997.  Patient was seen by our office in 2021 for elevated LFTs and concern for possible recurrent choledocholithiasis.  She was recommended to obtain MRCP which was unrevealing.  Patient states since 2021 she has not had a recurrent episode of pain until yesterday.  Yesterday around 6:30 PM she had intense epigastric pain that radiated to her back.  She states this came out of nowhere and was not associated with eating.  Her husband brought her to the ER but by the time she arrived the pain had subsided so she went home.  She then woke up in the middle the night with recurrent pain and came to the ED for further evaluation.  She states this pain is similar to how she felt in 1993 1997 when she had to have an ERCP.  Denies nausea/vomiting.  Denies fever.  Has not had further pain.  She does note her urine has become more dark over the last few days.  CTA chest abdomen pelvis showed pulmonary hypertension, possible cirrhotic liver, CBD 12 mm without visible filling defect, mild intrahepatic bile duct dilation with pneumobilia  AST 328/ALT 266/alk phos 126 Total bilirubin 2.5 Leukocytosis with WBC 10.8 Normal lipase  Over the past few years she has had mild elevation in ALT but otherwise normal LFTs and she has a known history of fatty liver  Previous workup for elevated LFTs in 2021 with negative autoimmune, genetic, viral  source.  Denies alcohol use.  Denies new medications.  Denies herbal supplements.  Denies IV drug use.  Denies previous history of colonoscopy and denies known family history of colon cancer (mother is adopted)  Past Medical History:  Diagnosis Date   Allergy    As a child   Anxiety    Arthritis    In knees   Asthma    Diabetes mellitus without complication (HCC)    Hyperlipidemia    Hypertension    Obesity    OSA (obstructive sleep apnea)    Ovarian cyst    Sleep apnea     Surgical History:  She  has a past surgical history that includes Cesarean section; Nasal sinus surgery; Tubal ligation; bone spur (Right); Breast surgery; ovarian cyst removal; Abdominal hysterectomy; Cholecystectomy; and Breast excisional biopsy (Left). Family History:  Her family history includes Arthritis in her mother; Asthma in her mother; Cervical cancer in her paternal grandmother; Diabetes in her mother, paternal aunt, and paternal grandfather; Heart disease in her father, mother, and paternal uncle; Hyperlipidemia in her mother; Hypertension in her father and mother; Immunodeficiency in her mother; Leukemia in her paternal grandmother; Lupus in her mother; Prostate cancer in her paternal uncle; Stroke in her paternal grandfather; Thyroid  disease in her mother. Social History:   reports that she quit smoking about 12 years ago. Her smoking use included cigarettes and e-cigarettes. She started smoking about 24 years  ago. She has a 24 pack-year smoking history. She has never used smokeless tobacco. She reports current alcohol use. She reports that she does not use drugs.  Prior to Admission medications   Medication Sig Start Date End Date Taking? Authorizing Provider  acetic acid 2 % otic solution Place 2 drops into both ears every 3 (three) hours as needed. 03/17/24   Adine Oddis LABOR, FNP  albuterol  (ACCUNEB ) 0.63 MG/3ML nebulizer solution Take 1 ampule by nebulization every 6 (six) hours as needed for  wheezing.    [provider]  albuterol  (VENTOLIN  HFA) 108 (90 Base) MCG/ACT inhaler Inhale 1-2 puffs into the lungs every 6 (six) hours as needed. 11/28/23   Orlean Alan HERO, FNP  famotidine  (PEPCID ) 20 MG tablet Take 1 tablet (20 mg total) by mouth at bedtime. 11/28/23   Orlean Alan HERO, FNP  hydrochlorothiazide  (HYDRODIURIL ) 50 MG tablet Take 1 tablet by mouth once daily 03/16/24   Shirley, Amanda M, FNP  levocetirizine (XYZAL ) 5 MG tablet Take 1 tablet (5 mg total) by mouth every morning. 11/28/23   Orlean Alan HERO, FNP  montelukast  (SINGULAIR ) 10 MG tablet Take 1 tablet (10 mg total) by mouth as needed. 11/28/23   Orlean Alan HERO, FNP  tirzepatide  (ZEPBOUND ) 5 MG/0.5ML Pen Inject 5 mg into the skin once a week. Patient not taking: Reported on 03/17/2024 11/14/23   Orlean Alan HERO, FNP  venlafaxine  XR (EFFEXOR -XR) 150 MG 24 hr capsule Take 1 capsule (150 mg total) by mouth daily with breakfast. 11/28/23   Orlean Alan HERO, FNP  Vitamin D , Ergocalciferol , (DRISDOL ) 1.25 MG (50000 UNIT) CAPS capsule Take 1 capsule (50,000 Units total) by mouth once a week. 11/28/23   Orlean Alan HERO, FNP    Current Facility-Administered Medications  Medication Dose Route Frequency Provider Last Rate Last Admin   aspirin  chewable tablet 324 mg  324 mg Oral Once Raford Lenis, MD       Current Outpatient Medications  Medication Sig Dispense Refill   acetic acid 2 % otic solution Place 2 drops into both ears every 3 (three) hours as needed. 15 mL 0   albuterol  (ACCUNEB ) 0.63 MG/3ML nebulizer solution Take 1 ampule by nebulization every 6 (six) hours as needed for wheezing.     albuterol  (VENTOLIN  HFA) 108 (90 Base) MCG/ACT inhaler Inhale 1-2 puffs into the lungs every 6 (six) hours as needed. 8 g 0   famotidine  (PEPCID ) 20 MG tablet Take 1 tablet (20 mg total) by mouth at bedtime. 90 tablet 1   hydrochlorothiazide  (HYDRODIURIL ) 50 MG tablet Take 1 tablet by mouth once daily 90 tablet 0    levocetirizine (XYZAL ) 5 MG tablet Take 1 tablet (5 mg total) by mouth every morning. 90 tablet 1   montelukast  (SINGULAIR ) 10 MG tablet Take 1 tablet (10 mg total) by mouth as needed. 90 tablet 0   tirzepatide  (ZEPBOUND ) 5 MG/0.5ML Pen Inject 5 mg into the skin once a week. (Patient not taking: Reported on 03/17/2024) 2 mL 1   venlafaxine  XR (EFFEXOR -XR) 150 MG 24 hr capsule Take 1 capsule (150 mg total) by mouth daily with breakfast. 90 capsule 1   Vitamin D , Ergocalciferol , (DRISDOL ) 1.25 MG (50000 UNIT) CAPS capsule Take 1 capsule (50,000 Units total) by mouth once a week. 12 capsule 3    Allergies as of 04/02/2024   (No Known Allergies)    Review of Systems  Constitutional:  Negative for chills, fever and weight loss.  HENT:  Negative for tinnitus.  Eyes:  Negative for blurred vision and double vision.  Respiratory:  Negative for cough and hemoptysis.   Cardiovascular:  Negative for chest pain and palpitations.  Gastrointestinal:  Positive for abdominal pain. Negative for blood in stool, constipation, diarrhea, heartburn, melena, nausea and vomiting.  Genitourinary:  Negative for dysuria and urgency.  Musculoskeletal:  Negative for myalgias and neck pain.  Skin:  Negative for itching and rash.  Neurological:  Negative for seizures and loss of consciousness.  Psychiatric/Behavioral:  Negative for depression and suicidal ideas.        Physical Exam:  Vital signs in last 24 hours: Temp:  [97.5 F (36.4 C)] 97.5 F (36.4 C) (10/31 0352) Pulse Rate:  [100] 100 (10/31 0352) Resp:  [14] 14 (10/31 0352) BP: (111)/(68) 111/68 (10/31 0352) SpO2:  [98 %-100 %] 100 % (10/31 0352)   Last BM recorded by nurses in past 5 days No data recorded  Physical Exam Constitutional:      Appearance: She is obese.  HENT:     Head: Normocephalic and atraumatic.     Nose: Nose normal. No congestion.     Mouth/Throat:     Mouth: Mucous membranes are moist.     Pharynx: Oropharynx is clear.   Eyes:     General: No scleral icterus.    Extraocular Movements: Extraocular movements intact.  Cardiovascular:     Rate and Rhythm: Normal rate and regular rhythm.  Pulmonary:     Effort: Pulmonary effort is normal. No respiratory distress.  Abdominal:     General: Bowel sounds are normal. There is no distension.     Palpations: Abdomen is soft. There is no mass.     Tenderness: There is no abdominal tenderness. There is no rebound.  Musculoskeletal:        General: No swelling. Normal range of motion.     Cervical back: Normal range of motion and neck supple.  Skin:    General: Skin is warm and dry.  Neurological:     General: No focal deficit present.     Mental Status: She is oriented to person, place, and time.  Psychiatric:        Mood and Affect: Mood normal.        Behavior: Behavior normal.        Thought Content: Thought content normal.        Judgment: Judgment normal.      LAB RESULTS: Recent Labs    04/02/24 0416  WBC 10.8*  HGB 14.4  HCT 42.4  PLT 163   BMET Recent Labs    04/02/24 0416  NA 136  K 2.9*  CL 94*  CO2 27  GLUCOSE 155*  BUN 11  CREATININE 0.92  CALCIUM 8.8*   LFT Recent Labs    04/02/24 0416  PROT 6.4*  ALBUMIN 3.5  AST 328*  ALT 266*  ALKPHOS 126  BILITOT 2.5*   PT/INR No results for input(s): LABPROT, INR in the last 72 hours.  STUDIES: CT Angio Chest/Abd/Pel for Dissection W and/or Wo Contrast Result Date: 04/02/2024 EXAM: CTA CHEST, ABDOMEN AND PELVIS WITH AND WITHOUT CONTRAST 04/02/2024 05:45:45 AM TECHNIQUE: CTA of the chest was performed with and without the administration of intravenous contrast. CTA of the abdomen and pelvis was performed with the administration of intravenous contrast. Multiplanar reformatted images are provided for review. MIP images are provided for review. Automated exposure control, iterative reconstruction, and/or weight based adjustment of the mA/kV was utilized to reduce the  radiation  dose to as low as reasonably achievable. COMPARISON: PA and lateral chest 12/29/2020, and partial chest CT with contrast for heart morphology and coronary CTA dated 05/12/2020. CLINICAL HISTORY: Sternal chest pain radiating posteriorly 2 episodes beginning yesterday evening and recurring this morning. Aortic aneurysm or dissection suspected. FINDINGS: VASCULATURE: AORTA: The thoracic aorta is normal in course and caliber. There is no aneurysm, stenosis, or dissection. There is trace calcific plaque in the proximal descending aorta, but no other thoracic aortic calcification. There are no calcifications in the abdominal aorta. No abdominal aortic aneurysm. PULMONARY ARTERIES: The pulmonary trunk measures prominent, 3.3 cm suggesting arterial hypertension, was previously 2.6 cm. No arterial embolism is seen to the segmental level. GREAT VESSELS OF AORTIC ARCH: No acute finding. No dissection. No arterial occlusion or significant stenosis. CELIAC TRUNK: No acute finding. No occlusion or significant stenosis. SUPERIOR MESENTERIC ARTERY: No acute finding. No occlusion or significant stenosis. INFERIOR MESENTERIC ARTERY: No acute finding. No occlusion or significant stenosis. RENAL ARTERIES: No acute finding. No occlusion or significant stenosis. ILIAC ARTERIES: No acute finding. No occlusion or significant stenosis. CHEST: MEDIASTINUM: No mediastinal lymphadenopathy. The heart and pericardium demonstrate no acute abnormality. LUNGS AND PLEURA: There is coarse chronic linear scarring in the lingular base and a small amount also chronically in the medial right middle lobe. Additional linear scarring anteriorly in the right upper lobe. There are 2 chronic fissural nodules each measuring 6 mm in the right mid lung on axial 63 of series 7, stable, most likely intrapulmonary lymph nodes. No follow-up imaging is recommended. There is mild chronic elevation of the right hemidiaphragm. There are no further nodules. There is no  consolidation, effusion, or pneumothorax. THORACIC BONES AND SOFT TISSUES: No acute bone or soft tissue abnormality. ABDOMEN AND PELVIS: LIVER: The liver is 18 cm in length, moderately steatotic with a cirrhotic configuration and capsular nodularity over portions consistent with cirrhosis. There is a prominent hepatic portal main vein measuring 16.3 mm. There is no mass enhancement throughout the liver. GALLBLADDER AND BILE DUCTS: The patient is status post cholecystectomy. There is a prominent common bile duct measuring 12 mm without a visible filling defect. This is probably physiologic related to the cholecystectomy. There is mild intrahepatic bile duct dilatation. Pneumobilia is also noted, most likely due to prior sphincterotomy, occasionally may be seen with infection. SPLEEN: The spleen is mildly prominent, measuring 13.3 cm, without focal mass. PANCREAS: The pancreas is unremarkable. ADRENAL GLANDS: Bilateral adrenal glands demonstrate no acute abnormality. KIDNEYS, URETERS AND BLADDER: No stones in the kidneys or ureters. No hydronephrosis. No perinephric or periureteral stranding. Urinary bladder is unremarkable. Jensen AND BOWEL: Stomach and duodenal sweep demonstrate no acute abnormality. There is no bowel obstruction. No abnormal bowel wall thickening or distension. There is colonic diverticulosis with no evidence of colitis or diverticulitis. The appendix is normal. REPRODUCTIVE: Uterus is absent with no adnexal mass. PERITONEUM AND RETROPERITONEUM: No ascites or free air. LYMPH NODES: No lymphadenopathy. ABDOMINAL BONES AND SOFT TISSUES: There are L5 pars defects with chronic appearance, chronic L5-S1 disc collapse, and a 9 mm grade 1 L5-S1 spondylolisthesis with severe secondary foraminal stenosis. No acute or other significant osseous findings. There is umbilical level rectal diastasis and a small umbilical hernia containing fat. There is no incarcerated hernia. No acute soft tissue abnormality.  IMPRESSION: 1. No evidence of aortic aneurysm or dissection. 2. Prominent pulmonary trunk measuring 3.3 cm, increased from 2.6 cm, suggesting pulmonary arterial hypertension. No pulmonary embolism to the segmental  level. 3. Moderately steatotic liver with cirrhotic configuration and capsular nodularity, slight splenomegaly and prominence in the hepatic portal vein . No hepatic mass enhancement. Mild intrahepatic bile duct dilatation and pneumobilia, likely post-sphincterotomy. 4. Colonic diverticulosis without evidence of colitis or diverticulitis. 5. 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 Electronically signed by: Francis Quam MD 04/02/2024 06:26 AM EDT RP Workstation: [id number]      Impression    Elevated LFTs Epigastric pain History of cholecystectomy 1989 s/p ERCP with sphincterotomy and stone extraction in 1993 in 1997. CTA chest abdomen pelvis showed pulmonary hypertension, possible cirrhotic liver, CBD 12 mm without visible filling defect, mild intrahepatic bile duct dilation with pneumobilia AST 328/ALT 266/alk phos 126 Total bilirubin 2.5 Leukocytosis with WBC 10.8 Normal lipase Previous workup in 2021 negative for autoimmune, genetic, viral source of elevated LFTs.  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  No alcohol use.  No IV drug use.  No new meds or herbal supplements.  Recurrent episodes of epigastric pain that radiates to the back with no identifiable trigger in the past but this has not occurred in a couple years until recently.  Diabetes   Plan   - Check fractionated bilirubin - MRCP for evaluation of possible choledocholithiasis - Hepatitis panel - PT/INR - Continue to trend LFTs - Continue supportive care - Diet as tolerated - Pain control per primary team  Thank you for your kind consultation, we will continue to  follow.   Jody Jensen  04/02/2024, 8:30 AM   --------------------------------------------------------------------------------------------------------------------------------------------  I have taken a history, reviewed the chart and examined the patient. I performed a substantive portion of this encounter, including complete performance of at least one of the key components, in conjunction with the APP. I agree with the APP's note, impression and recommendations  53 year old female with history of obesity, anxiety, diabetes and sleep apnea and remote history of cholecystectomy in 1989, with subsequent recurrence of common bile duct stones requiring ERCP in 1993 in 1997, who presented with abrupt onset of upper abdominal/lower chest pain that radiated to her back.  She had an episode that lasted about 30 minutes then resolved, but then had another episode in the melanite prompting her to return to the ED.  She had 1 more attack in the emergency department, but has not had any more symptoms since arriving to the floor. Her liver enzymes were elevated on admission with T. bili 2.5.  A repeat fractionated bilirubin showed that the bilirubin had increased to 4 and was direct. MRCP this evening showed multiple filling defects in the common bile duct suspicious for choledocholithiasis.  Patient appears to have symptomatic choledocholithiasis.  No concerns for cholangitis at the moment.  She is currently comfortable without any pain.  Do not think that empiric antibiotics are indicated at this point.  Dr. Saintclair  is providing ERCP coverage for Jody Jensen this weekend.  She is aware the patient and is tentatively planning for an ERCP on Sunday.  Okay to continue diet through tomorrow.  Make n.p.o. after midnight Saturday night for ERCP.  Jody Jensen E. Stacia, MD [facility]  Moderate complex medical decision making (this includes chart review, review of results, face-to-face time used  for counseling as well as treatment plan and follow-up. The patient was provided an opportunity to ask questions and all were answered. The patient agreed with the plan and  demonstrated an understanding of the instructions

## 2024-04-02 NOTE — ED Triage Notes (Signed)
 10/10 chest pain started while cooking dinner felt like pressure in the sternum and to the back. Resolved after about an hour. Woke her up out of sleep around 2am and she came to ED

## 2024-04-02 NOTE — H&P (Signed)
 History and Physical    Patient: Jody Jensen FMW:[id number] DOB: [birth year] DOA: 04/02/2024 DOS: the patient was seen and examined on 04/02/2024 PCP: Orlean Alan HERO, FNP  Patient coming from: Home  Chief Complaint:  Chief Complaint  Patient presents with   Chest Pain    10/10 chest pain started while cooking dinner felt like pressure in the sternum and to the back. Resolved after about an hour. Woke her up out of sleep around 2am and she came to ED   HPI: Jody Jensen is a 53 y.o. female with medical history significant of HTN, HLD, DM2, OSA, and morbid obesity p/w upper abdominal pain.  The patient reported the onset of chest pain around 6:15 to 6:30 PM yesterday. The pain radiated to the back and caused labored breathing. Initially, the patient tried to see if the pain would subside, but it did not. The patient's husband took her to the emergency room, but the pain ceased upon arrival, so they returned home. After going to bed, the pain reawakened the patient a couple of hours later, with intensity as bad as or worse than the initial episode. Concerned about a possible heart attack, the patient called 911, and an ambulance brought her to the hospital. The patient experienced one more episode of pain since being admitted.  In the ED, pt AFVSS. Labs notable for K 2.9, Mg 1.8, AST/ALT 328/266, T bili 4.2, and direct bili 2.5. CT chest/abdomen/pelvis showed no evidence of aortic aneurysm or dissection, but moderately steatotic liver with cirrhotic configuration and prominent pulmonary trunk measuring 3.3 cm, increased from 2.6 cm, suggesting pulmonary arterial hypertension without pulmonary embolism to the segmental level. EDP consulted GI and requested medicine admission.    Review of Systems: As mentioned in the history of present illness. All other systems reviewed and are negative. Past Medical History:  Diagnosis Date   Allergy    As a child   Anxiety    Arthritis    In knees    Asthma    Diabetes mellitus without complication (HCC)    Hyperlipidemia    Hypertension    Obesity    OSA (obstructive sleep apnea)    Ovarian cyst    Sleep apnea    Past Surgical History:  Procedure Laterality Date   ABDOMINAL HYSTERECTOMY      partial, ovaries remain, no cervix   bone spur Right    BREAST EXCISIONAL BIOPSY Left    BREAST SURGERY     benign breast cyst removal   CESAREAN SECTION     CHOLECYSTECTOMY     NASAL SINUS SURGERY     ovarian cyst removal     TUBAL LIGATION     Social History:  reports that she quit smoking about 12 years ago. Her smoking use included cigarettes and e-cigarettes. She started smoking about 24 years ago. She has a 24 pack-year smoking history. She has never used smokeless tobacco. She reports current alcohol use. She reports that she does not use drugs.  No Known Allergies  Family History  Problem Relation Age of Onset   Hypertension Mother    Hyperlipidemia Mother    Heart disease Mother    Diabetes Mother    Asthma Mother    Thyroid  disease Mother    Lupus Mother    Immunodeficiency Mother    Arthritis Mother    Heart disease Father    Hypertension Father    Cervical cancer Paternal Grandmother        cervical  Leukemia Paternal Grandmother    Diabetes Paternal Grandfather    Stroke Paternal Grandfather    Diabetes Paternal Aunt    Prostate cancer Paternal Uncle    Heart disease Paternal Uncle     Prior to Admission medications   Medication Sig Start Date End Date Taking? Authorizing Provider  acetaminophen  (TYLENOL ) 500 MG tablet Take 1,000 mg by mouth every 6 (six) hours as needed for moderate pain (pain score 4-6).   Yes [provider]  acetic acid 2 % otic solution Place 2 drops into both ears every 3 (three) hours as needed. Patient taking differently: Place 2 drops into both ears in the morning and at bedtime. 03/17/24  Yes Adine Oddis LABOR, FNP  albuterol  (VENTOLIN  HFA) 108 (90 Base) MCG/ACT  inhaler Inhale 1-2 puffs into the lungs every 6 (six) hours as needed. 11/28/23  Yes Orlean Alan HERO, FNP  hydrochlorothiazide  (HYDRODIURIL ) 50 MG tablet Take 1 tablet by mouth once daily 03/16/24  Yes Shirley, Amanda M, FNP  ibuprofen (ADVIL) 200 MG tablet Take 400 mg by mouth every 6 (six) hours as needed for moderate pain (pain score 4-6).   Yes [provider]  levocetirizine (XYZAL ) 5 MG tablet Take 1 tablet (5 mg total) by mouth every morning. 11/28/23  Yes Orlean Alan HERO, FNP  montelukast  (SINGULAIR ) 10 MG tablet Take 1 tablet (10 mg total) by mouth as needed. Patient taking differently: Take 10 mg by mouth in the morning. 11/28/23  Yes Orlean Alan HERO, FNP  venlafaxine  XR (EFFEXOR -XR) 150 MG 24 hr capsule Take 1 capsule (150 mg total) by mouth daily with breakfast. 11/28/23  Yes Orlean Alan HERO, FNP  Vitamin D , Ergocalciferol , (DRISDOL ) 1.25 MG (50000 UNIT) CAPS capsule Take 1 capsule (50,000 Units total) by mouth once a week. 11/28/23  Yes Orlean Alan HERO, FNP  famotidine  (PEPCID ) 20 MG tablet Take 1 tablet (20 mg total) by mouth at bedtime. Patient taking differently: Take 20 mg by mouth daily. 11/28/23   Orlean Alan HERO, FNP    Physical Exam: Vitals:   04/02/24 0800 04/02/24 0857 04/02/24 1059 04/02/24 1121  BP: (!) 103/58 (!) 121/54 (!) 140/65 (!) 141/82  Pulse: 87 95 92 88  Resp: 18 14  16   Temp:  99.2 F (37.3 C) 98.4 F (36.9 C) 98.8 F (37.1 C)  TempSrc:  Oral Oral Oral  SpO2: 100% 100% 96% 95%   General: Alert, oriented x3, resting comfortably in no acute distress HEENT: EOMI, oropharynx clear, moist mucous membranes, hearing intact Neck: Trachea midline and no gross thyromegaly Respiratory: Lungs clear to auscultation bilaterally with normal respiratory effort; no w/r/r Cardiovascular: Regular rate and rhythm w/o m/r/g Abdomen: Soft, nontender, nondistended. Positive bowel sounds MSK: No obvious joint deformities or swelling Skin: No obvious rashes or  lesions Neurologic: Awake, alert, spontaneously moves all extremities, strength intact Psychiatric: Appropriate mood and affect, conversational and cooperative   Data Reviewed:  Lab Results  Component Value Date   WBC 10.8 (H) 04/02/2024   HGB 14.4 04/02/2024   HCT 42.4 04/02/2024   MCV 87.1 04/02/2024   PLT 163 04/02/2024   Lab Results  Component Value Date   GLUCOSE 155 (H) 04/02/2024   CALCIUM 8.8 (L) 04/02/2024   NA 136 04/02/2024   K 2.9 (L) 04/02/2024   CO2 27 04/02/2024   CL 94 (L) 04/02/2024   BUN 11 04/02/2024   CREATININE 0.92 04/02/2024   Lab Results  Component Value Date   ALT 266 (H) 04/02/2024  AST 328 (H) 04/02/2024   ALKPHOS 126 04/02/2024   BILITOT 4.2 (H) 04/02/2024   Lab Results  Component Value Date   INR 1.1 04/02/2024   INR 1.0 08/01/2019   Radiology: US  Abdomen Limited Result Date: 04/02/2024 CLINICAL DATA:  Right upper quadrant pain.  Prior cholecystectomy. EXAM: ULTRASOUND ABDOMEN LIMITED RIGHT UPPER QUADRANT COMPARISON:  02/22/2020 FINDINGS: Gallbladder: Surgically absent. Common bile duct: Diameter: 9 mm, which is mildly dilated. Liver: Diffusely increased parenchymal echogenicity again seen, consistent with steatosis. No hepatic masses identified. Portal vein is patent on color Doppler imaging with normal direction of blood flow towards the liver. Other: None. IMPRESSION: Prior cholecystectomy. Mild dilatation of common bile duct measuring 9 mm. Suggest correlation with liver function tests, and consider abdomen MRI and MRCP without and with contrast if clinically warranted. Hepatic steatosis. Electronically Signed   By: Norleen DELENA Kil M.D.   On: 04/02/2024 08:32   CT Angio Chest/Abd/Pel for Dissection W and/or Wo Contrast Result Date: 04/02/2024 EXAM: CTA CHEST, ABDOMEN AND PELVIS WITH AND WITHOUT CONTRAST 04/02/2024 05:45:45 AM TECHNIQUE: CTA of the chest was performed with and without the administration of intravenous contrast. CTA of the  abdomen and pelvis was performed with the administration of intravenous contrast. Multiplanar reformatted images are provided for review. MIP images are provided for review. Automated exposure control, iterative reconstruction, and/or weight based adjustment of the mA/kV was utilized to reduce the radiation dose to as low as reasonably achievable. COMPARISON: PA and lateral chest 12/29/2020, and partial chest CT with contrast for heart morphology and coronary CTA dated 05/12/2020. CLINICAL HISTORY: Sternal chest pain radiating posteriorly 2 episodes beginning yesterday evening and recurring this morning. Aortic aneurysm or dissection suspected. FINDINGS: VASCULATURE: AORTA: The thoracic aorta is normal in course and caliber. There is no aneurysm, stenosis, or dissection. There is trace calcific plaque in the proximal descending aorta, but no other thoracic aortic calcification. There are no calcifications in the abdominal aorta. No abdominal aortic aneurysm. PULMONARY ARTERIES: The pulmonary trunk measures prominent, 3.3 cm suggesting arterial hypertension, was previously 2.6 cm. No arterial embolism is seen to the segmental level. GREAT VESSELS OF AORTIC ARCH: No acute finding. No dissection. No arterial occlusion or significant stenosis. CELIAC TRUNK: No acute finding. No occlusion or significant stenosis. SUPERIOR MESENTERIC ARTERY: No acute finding. No occlusion or significant stenosis. INFERIOR MESENTERIC ARTERY: No acute finding. No occlusion or significant stenosis. RENAL ARTERIES: No acute finding. No occlusion or significant stenosis. ILIAC ARTERIES: No acute finding. No occlusion or significant stenosis. CHEST: MEDIASTINUM: No mediastinal lymphadenopathy. The heart and pericardium demonstrate no acute abnormality. LUNGS AND PLEURA: There is coarse chronic linear scarring in the lingular base and a small amount also chronically in the medial right middle lobe. Additional linear scarring anteriorly in the  right upper lobe. There are 2 chronic fissural nodules each measuring 6 mm in the right mid lung on axial 63 of series 7, stable, most likely intrapulmonary lymph nodes. No follow-up imaging is recommended. There is mild chronic elevation of the right hemidiaphragm. There are no further nodules. There is no consolidation, effusion, or pneumothorax. THORACIC BONES AND SOFT TISSUES: No acute bone or soft tissue abnormality. ABDOMEN AND PELVIS: LIVER: The liver is 18 cm in length, moderately steatotic with a cirrhotic configuration and capsular nodularity over portions consistent with cirrhosis. There is a prominent hepatic portal main vein measuring 16.3 mm. There is no mass enhancement throughout the liver. GALLBLADDER AND BILE DUCTS: The patient is status post cholecystectomy. There  is a prominent common bile duct measuring 12 mm without a visible filling defect. This is probably physiologic related to the cholecystectomy. There is mild intrahepatic bile duct dilatation. Pneumobilia is also noted, most likely due to prior sphincterotomy, occasionally may be seen with infection. SPLEEN: The spleen is mildly prominent, measuring 13.3 cm, without focal mass. PANCREAS: The pancreas is unremarkable. ADRENAL GLANDS: Bilateral adrenal glands demonstrate no acute abnormality. KIDNEYS, URETERS AND BLADDER: No stones in the kidneys or ureters. No hydronephrosis. No perinephric or periureteral stranding. Urinary bladder is unremarkable. GI AND BOWEL: Stomach and duodenal sweep demonstrate no acute abnormality. There is no bowel obstruction. No abnormal bowel wall thickening or distension. There is colonic diverticulosis with no evidence of colitis or diverticulitis. The appendix is normal. REPRODUCTIVE: Uterus is absent with no adnexal mass. PERITONEUM AND RETROPERITONEUM: No ascites or free air. LYMPH NODES: No lymphadenopathy. ABDOMINAL BONES AND SOFT TISSUES: There are L5 pars defects with chronic appearance, chronic L5-S1  disc collapse, and a 9 mm grade 1 L5-S1 spondylolisthesis with severe secondary foraminal stenosis. No acute or other significant osseous findings. There is umbilical level rectal diastasis and a small umbilical hernia containing fat. There is no incarcerated hernia. No acute soft tissue abnormality. IMPRESSION: 1. No evidence of aortic aneurysm or dissection. 2. Prominent pulmonary trunk measuring 3.3 cm, increased from 2.6 cm, suggesting pulmonary arterial hypertension. No pulmonary embolism to the segmental level. 3. Moderately steatotic liver with cirrhotic configuration and capsular nodularity, slight splenomegaly and prominence in the hepatic portal vein . No hepatic mass enhancement. Mild intrahepatic bile duct dilatation and pneumobilia, likely post-sphincterotomy. 4. Colonic diverticulosis without evidence of colitis or diverticulitis. 5. 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 Electronically signed by: Francis Quam MD 04/02/2024 06:26 AM EDT RP Workstation: [id number]    Assessment and Plan: 73F h/o HTN, HLD, DM2, OSA, and morbid obesity p/w upper abdominal pain.  Abdominal pain Presumed choledocholithiasis -GI consulted; apprec eval/recs -F/u MRCP -Defer IV abx for now given clinical stability -MIVF: NS at 100cc/h for now -IV morphine 1mg  q4h prn  Seasonal allergies -PTA Xyzal  5mg  daily  Mood disorder -PTA velafaxine 150mg  daily   Advance Care Planning:   Code Status: Full Code   Consults: N/A  Family Communication: Spouse  Severity of Illness: The appropriate patient status for this patient is INPATIENT. Inpatient status is judged to be reasonable and necessary in order to provide the required intensity of service to ensure the patient's safety. The patient's presenting symptoms, physical exam findings, and initial radiographic and laboratory data in the context of their  chronic comorbidities is felt to place them at high risk for further clinical deterioration. Furthermore, it is not anticipated that the patient will be medically stable for discharge from the hospital within 2 midnights of admission.   * I certify that at the point of admission it is my clinical judgment that the patient will require inpatient hospital care spanning beyond 2 midnights from the point of admission due to high intensity of service, high risk for further deterioration and high frequency of surveillance required.*   ------- I spent 57 minutes reviewing previous notes, at the bedside counseling/discussing the treatment plan, and performing clinical documentation.  Author: Marsha Ada, MD 04/02/2024 1:42 PM  For on call review [email or web address].

## 2024-04-03 DIAGNOSIS — K8050 Calculus of bile duct without cholangitis or cholecystitis without obstruction: Secondary | ICD-10-CM

## 2024-04-03 DIAGNOSIS — Z9049 Acquired absence of other specified parts of digestive tract: Secondary | ICD-10-CM | POA: Diagnosis not present

## 2024-04-03 DIAGNOSIS — E876 Hypokalemia: Secondary | ICD-10-CM

## 2024-04-03 DIAGNOSIS — R1013 Epigastric pain: Secondary | ICD-10-CM

## 2024-04-03 DIAGNOSIS — R7989 Other specified abnormal findings of blood chemistry: Secondary | ICD-10-CM | POA: Diagnosis not present

## 2024-04-03 LAB — CBC WITH DIFFERENTIAL/PLATELET
Abs Immature Granulocytes: 0.03 K/uL (ref 0.00–0.07)
Basophils Absolute: 0 K/uL (ref 0.0–0.1)
Basophils Relative: 0 %
Eosinophils Absolute: 0.1 K/uL (ref 0.0–0.5)
Eosinophils Relative: 2 %
HCT: 38.2 % (ref 36.0–46.0)
Hemoglobin: 12.9 g/dL (ref 12.0–15.0)
Immature Granulocytes: 1 %
Lymphocytes Relative: 21 %
Lymphs Abs: 1.4 K/uL (ref 0.7–4.0)
MCH: 29.7 pg (ref 26.0–34.0)
MCHC: 33.8 g/dL (ref 30.0–36.0)
MCV: 87.8 fL (ref 80.0–100.0)
Monocytes Absolute: 0.5 K/uL (ref 0.1–1.0)
Monocytes Relative: 7 %
Neutro Abs: 4.4 K/uL (ref 1.7–7.7)
Neutrophils Relative %: 69 %
Platelets: 144 K/uL — ABNORMAL LOW (ref 150–400)
RBC: 4.35 MIL/uL (ref 3.87–5.11)
RDW: 14.9 % (ref 11.5–15.5)
WBC: 6.4 K/uL (ref 4.0–10.5)
nRBC: 0 % (ref 0.0–0.2)

## 2024-04-03 LAB — COMPREHENSIVE METABOLIC PANEL WITH GFR
ALT: 209 U/L — ABNORMAL HIGH (ref 0–44)
AST: 135 U/L — ABNORMAL HIGH (ref 15–41)
Albumin: 2.9 g/dL — ABNORMAL LOW (ref 3.5–5.0)
Alkaline Phosphatase: 105 U/L (ref 38–126)
Anion gap: 10 (ref 5–15)
BUN: 7 mg/dL (ref 6–20)
CO2: 30 mmol/L (ref 22–32)
Calcium: 8.2 mg/dL — ABNORMAL LOW (ref 8.9–10.3)
Chloride: 97 mmol/L — ABNORMAL LOW (ref 98–111)
Creatinine, Ser: 0.76 mg/dL (ref 0.44–1.00)
GFR, Estimated: >60 mL/min (ref >60)
Glucose, Bld: 125 mg/dL — ABNORMAL HIGH (ref 70–99)
Potassium: 2.5 mmol/L — CL (ref 3.5–5.1)
Sodium: 137 mmol/L (ref 135–145)
Total Bilirubin: 4.1 mg/dL — ABNORMAL HIGH (ref 0.0–1.2)
Total Protein: 5.8 g/dL — ABNORMAL LOW (ref 6.5–8.1)

## 2024-04-03 MED ORDER — ENOXAPARIN SODIUM 60 MG/0.6ML IJ SOSY: 60.0000 mg | PREFILLED_SYRINGE | INTRAMUSCULAR | Status: DC

## 2024-04-03 MED ORDER — SODIUM CHLORIDE 0.9 % IV SOLN: INTRAVENOUS | Status: DC

## 2024-04-03 MED ORDER — MAGNESIUM SULFATE 2 GM/50ML IV SOLN
2.0000 g | Freq: Once | INTRAVENOUS | Status: AC
  Administered 2024-04-03: 2 g via INTRAVENOUS
  Filled 2024-04-03: qty 50

## 2024-04-03 MED ORDER — POTASSIUM CHLORIDE CRYS ER 20 MEQ PO TBCR
40.0000 meq | EXTENDED_RELEASE_TABLET | ORAL | Status: AC
Start: 2024-04-03 — End: 2024-04-03
  Administered 2024-04-03 (×3): 40 meq via ORAL
  Filled 2024-04-03 (×3): qty 2

## 2024-04-03 MED ORDER — BUTALBITAL-APAP-CAFFEINE 50-325-40 MG PO TABS
1.0000 | ORAL_TABLET | Freq: Once | ORAL | Status: AC
  Administered 2024-04-03: 1 via ORAL
  Filled 2024-04-03: qty 1

## 2024-04-03 NOTE — Progress Notes (Signed)
 PROGRESS NOTE    Jody Jensen  FMW:[id number] DOB: [birth year] DOA: 04/02/2024 PCP: Orlean Alan HERO, FNP    Brief Narrative:  Jody Jensen is a 53 y.o. female with medical history significant of HTN, HLD, DM2, OSA, and morbid obesity p/w upper abdominal pain.   The patient reported the onset of chest pain around 6:15 to 6:30 PM yesterday. The pain radiated to the back and caused labored breathing. Initially, the patient tried to see if the pain would subside, but it did not. The patient's husband took her to the emergency room, but the pain ceased upon arrival, so they returned home. After going to bed, the pain reawakened the patient a couple of hours later, with intensity as bad as or worse than the initial episode. Concerned about a possible heart attack, the patient called 911, and an ambulance brought her to the hospital. The patient experienced one more episode of pain since being admitted.   In the ED, pt AFVSS. Labs notable for K 2.9, Mg 1.8, AST/ALT 328/266, T bili 4.2, and direct bili 2.5. CT chest/abdomen/pelvis showed no evidence of aortic aneurysm or dissection, but moderately steatotic liver with cirrhotic configuration and prominent pulmonary trunk measuring 3.3 cm, increased from 2.6 cm, suggesting pulmonary arterial hypertension without pulmonary embolism to the segmental level. EDP consulted GI and requested medicine admission.   Assessment and Plan: Abdominal pain from  choledocholithiasis -GI consulted-plan for ERCP on 11/2 -Defer IV abx for now given clinical stability -MIVF: NS at 100cc/h for now -IV morphine 1mg  q4h prn   Hypokalemia -replete -replete Mg as well -Stop hydrochlorothiazide   Seasonal allergies - Xyzal  5mg  daily   Mood disorder - velafaxine 150mg  daily   Obesity Estimated body mass index is 48.16 kg/m as calculated from the following:   Height as of 03/17/24: 5' 4 (1.626 m).   Weight as of 03/17/24: 127.3 kg.    DVT prophylaxis:  enoxaparin (LOVENOX) injection 40 mg Start: 04/02/24 0900    Code Status: Full Code   Disposition Plan:  Level of care: Med-Surg Status is: Inpatient   Consultants:  GI  Subjective: No abdominal pain currently  Objective: Vitals:   04/02/24 1800 04/02/24 2121 04/03/24 0023 04/03/24 1005  BP: 130/71 123/66 128/65 122/75  Pulse: 94 93 88 72  Resp: 16 17 18 16   Temp: 99.5 F (37.5 C) 100.1 F (37.8 C) 98.6 F (37 C) 98.5 F (36.9 C)  TempSrc: Oral Oral Oral Oral  SpO2: 93% 92% 93% 93%    Intake/Output Summary (Last 24 hours) at 04/03/2024 1129 Last data filed at 04/03/2024 0300 Gross per 24 hour  Intake 1166.35 ml  Output --  Net 1166.35 ml   There were no vitals filed for this visit.  Examination:   General: Appearance:    Severely obese female in no acute distress     Lungs:     Clear to auscultation bilaterally, respirations unlabored  Heart:    Normal heart rate.    MS:   All extremities are intact.    Neurologic:   Awake, alert       Data Reviewed: I have personally reviewed following labs and imaging studies  CBC: Recent Labs  Lab 04/02/24 0416 04/03/24 0928  WBC 10.8* 6.4  NEUTROABS 9.6* 4.4  HGB 14.4 12.9  HCT 42.4 38.2  MCV 87.1 87.8  PLT 163 144*   Basic Metabolic Panel: Recent Labs  Lab 04/02/24 0416 04/02/24 0732 04/03/24 0928  NA 136  --  137  K 2.9*  --  2.5*  CL 94*  --  97*  CO2 27  --  30  GLUCOSE 155*  --  125*  BUN 11  --  7  CREATININE 0.92  --  0.76  CALCIUM 8.8*  --  8.2*  MG  --  1.8  --    GFR: CrCl cannot be calculated (Unknown ideal weight.). Liver Function Tests: Recent Labs  Lab 04/02/24 0416 04/02/24 1126 04/03/24 0928  AST 328*  --  135*  ALT 266*  --  209*  ALKPHOS 126  --  105  BILITOT 2.5* 4.2* 4.1*  PROT 6.4*  --  5.8*  ALBUMIN 3.5  --  2.9*   Recent Labs  Lab 04/02/24 0416  LIPASE 27   No results for input(s): AMMONIA in the last 168 hours. Coagulation Profile: Recent Labs  Lab  04/02/24 1126  INR 1.1   Cardiac Enzymes: No results for input(s): CKTOTAL, CKMB, CKMBINDEX, TROPONINI in the last 168 hours. BNP (last 3 results) No results for input(s): PROBNP in the last 8760 hours. HbA1C: No results for input(s): HGBA1C in the last 72 hours. CBG: No results for input(s): GLUCAP in the last 168 hours. Lipid Profile: No results for input(s): CHOL, HDL, LDLCALC, TRIG, CHOLHDL, LDLDIRECT in the last 72 hours. Thyroid  Function Tests: No results for input(s): TSH, T4TOTAL, FREET4, T3FREE, THYROIDAB in the last 72 hours. Anemia Panel: No results for input(s): VITAMINB12, FOLATE, FERRITIN, TIBC, IRON, RETICCTPCT in the last 72 hours. Sepsis Labs: No results for input(s): PROCALCITON, LATICACIDVEN in the last 168 hours.  No results found for this or any previous visit (from the past 240 hours).       Radiology Studies: MR ABDOMEN MRCP W WO CONTAST Result Date: 04/02/2024 CLINICAL DATA:  Elevated liver function tests EXAM: MRI ABDOMEN WITHOUT AND WITH CONTRAST (INCLUDING MRCP) TECHNIQUE: Multiplanar multisequence MR imaging of the abdomen was performed both before and after the administration of intravenous contrast. Heavily T2-weighted images of the biliary and pancreatic ducts were obtained. Post-processing was applied at the acquisition scanner with concurrent physician supervision which includes 3D reconstructions, MIPs, volume rendered images and/or shaded surface rendering. CONTRAST:  10mL GADAVIST  GADOBUTROL  1 MMOL/ML IV SOLN COMPARISON:  Earlier same day CTA abdomen and pelvis and ultrasound abdomen FINDINGS: Lower chest: No acute findings. Hepatobiliary: Steatosis. Heterogeneous arterial parenchymal enhancement. Mild intrahepatic bile duct dilation. Common bile duct measures 9 mm. Dependent filling defects within the downstream common bile duct measuring up to 6 mm (13: 30, 31). Cholecystectomy. Pancreas: No mass,  inflammatory changes, or other parenchymal abnormality identified. Spleen:  Within normal limits in size and appearance. Adrenals/Urinary Tract: No adrenal nodules. No suspicious renal masses identified. No evidence of hydronephrosis. Stomach/Bowel: Visualized portions within the abdomen are unremarkable. Normal appendix. Vascular/Lymphatic: Prominent periportal lymph nodes measuring up to 14 mm (4:25). No abdominal aortic aneurysm demonstrated. Other:  None. Musculoskeletal: No suspicious bone lesions identified. IMPRESSION: 1. Mild intrahepatic and extrahepatic bile duct dilation with dependent filling defects within the downstream common bile duct measuring up to 6 mm, suspicious for choledocholithiasis. 2. Hepatic steatosis. 3. Prominent periportal lymph nodes measuring up to 14 mm, likely reactive. Electronically Signed   By: Limin  Xu M.D.   On: 04/02/2024 17:15   MR 3D Recon At Scanner Result Date: 04/02/2024 CLINICAL DATA:  Elevated liver function tests EXAM: MRI ABDOMEN WITHOUT AND WITH CONTRAST (INCLUDING MRCP) TECHNIQUE: Multiplanar multisequence MR imaging of the abdomen was performed both before and after  the administration of intravenous contrast. Heavily T2-weighted images of the biliary and pancreatic ducts were obtained. Post-processing was applied at the acquisition scanner with concurrent physician supervision which includes 3D reconstructions, MIPs, volume rendered images and/or shaded surface rendering. CONTRAST:  10mL GADAVIST  GADOBUTROL  1 MMOL/ML IV SOLN COMPARISON:  Earlier same day CTA abdomen and pelvis and ultrasound abdomen FINDINGS: Lower chest: No acute findings. Hepatobiliary: Steatosis. Heterogeneous arterial parenchymal enhancement. Mild intrahepatic bile duct dilation. Common bile duct measures 9 mm. Dependent filling defects within the downstream common bile duct measuring up to 6 mm (13: 30, 31). Cholecystectomy. Pancreas: No mass, inflammatory changes, or other parenchymal  abnormality identified. Spleen:  Within normal limits in size and appearance. Adrenals/Urinary Tract: No adrenal nodules. No suspicious renal masses identified. No evidence of hydronephrosis. Stomach/Bowel: Visualized portions within the abdomen are unremarkable. Normal appendix. Vascular/Lymphatic: Prominent periportal lymph nodes measuring up to 14 mm (4:25). No abdominal aortic aneurysm demonstrated. Other:  None. Musculoskeletal: No suspicious bone lesions identified. IMPRESSION: 1. Mild intrahepatic and extrahepatic bile duct dilation with dependent filling defects within the downstream common bile duct measuring up to 6 mm, suspicious for choledocholithiasis. 2. Hepatic steatosis. 3. Prominent periportal lymph nodes measuring up to 14 mm, likely reactive. Electronically Signed   By: Limin  Xu M.D.   On: 04/02/2024 17:15   US  Abdomen Limited Result Date: 04/02/2024 CLINICAL DATA:  Right upper quadrant pain.  Prior cholecystectomy. EXAM: ULTRASOUND ABDOMEN LIMITED RIGHT UPPER QUADRANT COMPARISON:  02/22/2020 FINDINGS: Gallbladder: Surgically absent. Common bile duct: Diameter: 9 mm, which is mildly dilated. Liver: Diffusely increased parenchymal echogenicity again seen, consistent with steatosis. No hepatic masses identified. Portal vein is patent on color Doppler imaging with normal direction of blood flow towards the liver. Other: None. IMPRESSION: Prior cholecystectomy. Mild dilatation of common bile duct measuring 9 mm. Suggest correlation with liver function tests, and consider abdomen MRI and MRCP without and with contrast if clinically warranted. Hepatic steatosis. Electronically Signed   By: Norleen DELENA Kil M.D.   On: 04/02/2024 08:32   CT Angio Chest/Abd/Pel for Dissection W and/or Wo Contrast Result Date: 04/02/2024 EXAM: CTA CHEST, ABDOMEN AND PELVIS WITH AND WITHOUT CONTRAST 04/02/2024 05:45:45 AM TECHNIQUE: CTA of the chest was performed with and without the administration of intravenous  contrast. CTA of the abdomen and pelvis was performed with the administration of intravenous contrast. Multiplanar reformatted images are provided for review. MIP images are provided for review. Automated exposure control, iterative reconstruction, and/or weight based adjustment of the mA/kV was utilized to reduce the radiation dose to as low as reasonably achievable. COMPARISON: PA and lateral chest 12/29/2020, and partial chest CT with contrast for heart morphology and coronary CTA dated 05/12/2020. CLINICAL HISTORY: Sternal chest pain radiating posteriorly 2 episodes beginning yesterday evening and recurring this morning. Aortic aneurysm or dissection suspected. FINDINGS: VASCULATURE: AORTA: The thoracic aorta is normal in course and caliber. There is no aneurysm, stenosis, or dissection. There is trace calcific plaque in the proximal descending aorta, but no other thoracic aortic calcification. There are no calcifications in the abdominal aorta. No abdominal aortic aneurysm. PULMONARY ARTERIES: The pulmonary trunk measures prominent, 3.3 cm suggesting arterial hypertension, was previously 2.6 cm. No arterial embolism is seen to the segmental level. GREAT VESSELS OF AORTIC ARCH: No acute finding. No dissection. No arterial occlusion or significant stenosis. CELIAC TRUNK: No acute finding. No occlusion or significant stenosis. SUPERIOR MESENTERIC ARTERY: No acute finding. No occlusion or significant stenosis. INFERIOR MESENTERIC ARTERY: No acute finding. No  occlusion or significant stenosis. RENAL ARTERIES: No acute finding. No occlusion or significant stenosis. ILIAC ARTERIES: No acute finding. No occlusion or significant stenosis. CHEST: MEDIASTINUM: No mediastinal lymphadenopathy. The heart and pericardium demonstrate no acute abnormality. LUNGS AND PLEURA: There is coarse chronic linear scarring in the lingular base and a small amount also chronically in the medial right middle lobe. Additional linear scarring  anteriorly in the right upper lobe. There are 2 chronic fissural nodules each measuring 6 mm in the right mid lung on axial 63 of series 7, stable, most likely intrapulmonary lymph nodes. No follow-up imaging is recommended. There is mild chronic elevation of the right hemidiaphragm. There are no further nodules. There is no consolidation, effusion, or pneumothorax. THORACIC BONES AND SOFT TISSUES: No acute bone or soft tissue abnormality. ABDOMEN AND PELVIS: LIVER: The liver is 18 cm in length, moderately steatotic with a cirrhotic configuration and capsular nodularity over portions consistent with cirrhosis. There is a prominent hepatic portal main vein measuring 16.3 mm. There is no mass enhancement throughout the liver. GALLBLADDER AND BILE DUCTS: The patient is status post cholecystectomy. There is a prominent common bile duct measuring 12 mm without a visible filling defect. This is probably physiologic related to the cholecystectomy. There is mild intrahepatic bile duct dilatation. Pneumobilia is also noted, most likely due to prior sphincterotomy, occasionally may be seen with infection. SPLEEN: The spleen is mildly prominent, measuring 13.3 cm, without focal mass. PANCREAS: The pancreas is unremarkable. ADRENAL GLANDS: Bilateral adrenal glands demonstrate no acute abnormality. KIDNEYS, URETERS AND BLADDER: No stones in the kidneys or ureters. No hydronephrosis. No perinephric or periureteral stranding. Urinary bladder is unremarkable. GI AND BOWEL: Stomach and duodenal sweep demonstrate no acute abnormality. There is no bowel obstruction. No abnormal bowel wall thickening or distension. There is colonic diverticulosis with no evidence of colitis or diverticulitis. The appendix is normal. REPRODUCTIVE: Uterus is absent with no adnexal mass. PERITONEUM AND RETROPERITONEUM: No ascites or free air. LYMPH NODES: No lymphadenopathy. ABDOMINAL BONES AND SOFT TISSUES: There are L5 pars defects with chronic  appearance, chronic L5-S1 disc collapse, and a 9 mm grade 1 L5-S1 spondylolisthesis with severe secondary foraminal stenosis. No acute or other significant osseous findings. There is umbilical level rectal diastasis and a small umbilical hernia containing fat. There is no incarcerated hernia. No acute soft tissue abnormality. IMPRESSION: 1. No evidence of aortic aneurysm or dissection. 2. Prominent pulmonary trunk measuring 3.3 cm, increased from 2.6 cm, suggesting pulmonary arterial hypertension. No pulmonary embolism to the segmental level. 3. Moderately steatotic liver with cirrhotic configuration and capsular nodularity, slight splenomegaly and prominence in the hepatic portal vein . No hepatic mass enhancement. Mild intrahepatic bile duct dilatation and pneumobilia, likely post-sphincterotomy. 4. Colonic diverticulosis without evidence of colitis or diverticulitis. 5. 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 Electronically signed by: Francis Quam MD 04/02/2024 06:26 AM EDT RP Workstation: [id number]        Scheduled Meds:  enoxaparin (LOVENOX) injection  40 mg Subcutaneous Q24H   loratadine  10 mg Oral Daily   potassium chloride  40 mEq Oral Q4H   venlafaxine  XR  150 mg Oral Q breakfast   Continuous Infusions:  sodium chloride  100 mL/hr at 04/03/24 0024   sodium chloride      magnesium sulfate bolus IVPB       LOS: 1 day    Time spent: 45 minutes spent on  chart review, discussion with nursing staff, consultants, updating family and interview/physical exam; more than 50% of that time was spent in counseling and/or coordination of care.    Harlene RAYMOND Bowl, DO [facility] Available via Epic secure chat 7am-7pm After these hours, please refer to coverage provider listed on [email or web address] 04/03/2024, 11:29 AM

## 2024-04-03 NOTE — H&P (View-Only) (Signed)
 Progress Note   LOS: 1 day   Chief Complaint: Choledocholithiasis   Subjective   Patient states she is doing really well today and has no pain.  Tolerating diet without difficulty.  Denies nausea/vomiting.  Husband is at bedside.   Objective   Vital signs in last 24 hours: Temp:  [98.5 F (36.9 C)-100.1 F (37.8 C)] 98.5 F (36.9 C) (11/01 1005) Pulse Rate:  [72-94] 72 (11/01 1005) Resp:  [16-18] 16 (11/01 1005) BP: (122-130)/(65-75) 122/75 (11/01 1005) SpO2:  [92 %-93 %] 93 % (11/01 1005) Weight:  [127.3 kg] 127.3 kg (11/01 0023) Last BM Date : 04/01/24 Last BM recorded by nurses in past 5 days No data recorded  General:   female in no acute distress Heart:  Regular rate and rhythm; no murmurs Pulm: Clear anteriorly; no wheezing Abdomen: soft, nondistended, normal bowel sounds in all quadrants. Nontender without guarding. No organomegaly appreciated. Extremities:  No edema Neurologic:  Alert and  oriented x4;  No focal deficits.  Psych:  Cooperative. Normal mood and affect.  Intake/Output from previous day: 10/31 0701 - 11/01 0700 In: 1266.4 [P.O.:120; I.V.:1046.4; IV Piggyback:100] Out: -  Intake/Output this shift: No intake/output data recorded.  Studies/Results: MR ABDOMEN MRCP W WO CONTAST Result Date: 04/02/2024 CLINICAL DATA:  Elevated liver function tests EXAM: MRI ABDOMEN WITHOUT AND WITH CONTRAST (INCLUDING MRCP) TECHNIQUE: Multiplanar multisequence MR imaging of the abdomen was performed both before and after the administration of intravenous contrast. Heavily T2-weighted images of the biliary and pancreatic ducts were obtained. Post-processing was applied at the acquisition scanner with concurrent physician supervision which includes 3D reconstructions, MIPs, volume rendered images and/or shaded surface rendering. CONTRAST:  10mL GADAVIST  GADOBUTROL  1 MMOL/ML IV SOLN COMPARISON:  Earlier same day CTA abdomen and pelvis and ultrasound abdomen FINDINGS: Lower  chest: No acute findings. Hepatobiliary: Steatosis. Heterogeneous arterial parenchymal enhancement. Mild intrahepatic bile duct dilation. Common bile duct measures 9 mm. Dependent filling defects within the downstream common bile duct measuring up to 6 mm (13: 30, 31). Cholecystectomy. Pancreas: No mass, inflammatory changes, or other parenchymal abnormality identified. Spleen:  Within normal limits in size and appearance. Adrenals/Urinary Tract: No adrenal nodules. No suspicious renal masses identified. No evidence of hydronephrosis. Stomach/Bowel: Visualized portions within the abdomen are unremarkable. Normal appendix. Vascular/Lymphatic: Prominent periportal lymph nodes measuring up to 14 mm (4:25). No abdominal aortic aneurysm demonstrated. Other:  None. Musculoskeletal: No suspicious bone lesions identified. IMPRESSION: 1. Mild intrahepatic and extrahepatic bile duct dilation with dependent filling defects within the downstream common bile duct measuring up to 6 mm, suspicious for choledocholithiasis. 2. Hepatic steatosis. 3. Prominent periportal lymph nodes measuring up to 14 mm, likely reactive. Electronically Signed   By: Limin  Xu M.D.   On: 04/02/2024 17:15   MR 3D Recon At Scanner Result Date: 04/02/2024 CLINICAL DATA:  Elevated liver function tests EXAM: MRI ABDOMEN WITHOUT AND WITH CONTRAST (INCLUDING MRCP) TECHNIQUE: Multiplanar multisequence MR imaging of the abdomen was performed both before and after the administration of intravenous contrast. Heavily T2-weighted images of the biliary and pancreatic ducts were obtained. Post-processing was applied at the acquisition scanner with concurrent physician supervision which includes 3D reconstructions, MIPs, volume rendered images and/or shaded surface rendering. CONTRAST:  10mL GADAVIST  GADOBUTROL  1 MMOL/ML IV SOLN COMPARISON:  Earlier same day CTA abdomen and pelvis and ultrasound abdomen FINDINGS: Lower chest: No acute findings. Hepatobiliary:  Steatosis. Heterogeneous arterial parenchymal enhancement. Mild intrahepatic bile duct dilation. Common bile duct measures 9 mm. Dependent  filling defects within the downstream common bile duct measuring up to 6 mm (13: 30, 31). Cholecystectomy. Pancreas: No mass, inflammatory changes, or other parenchymal abnormality identified. Spleen:  Within normal limits in size and appearance. Adrenals/Urinary Tract: No adrenal nodules. No suspicious renal masses identified. No evidence of hydronephrosis. Stomach/Bowel: Visualized portions within the abdomen are unremarkable. Normal appendix. Vascular/Lymphatic: Prominent periportal lymph nodes measuring up to 14 mm (4:25). No abdominal aortic aneurysm demonstrated. Other:  None. Musculoskeletal: No suspicious bone lesions identified. IMPRESSION: 1. Mild intrahepatic and extrahepatic bile duct dilation with dependent filling defects within the downstream common bile duct measuring up to 6 mm, suspicious for choledocholithiasis. 2. Hepatic steatosis. 3. Prominent periportal lymph nodes measuring up to 14 mm, likely reactive. Electronically Signed   By: Limin  Xu M.D.   On: 04/02/2024 17:15   US  Abdomen Limited Result Date: 04/02/2024 CLINICAL DATA:  Right upper quadrant pain.  Prior cholecystectomy. EXAM: ULTRASOUND ABDOMEN LIMITED RIGHT UPPER QUADRANT COMPARISON:  02/22/2020 FINDINGS: Gallbladder: Surgically absent. Common bile duct: Diameter: 9 mm, which is mildly dilated. Liver: Diffusely increased parenchymal echogenicity again seen, consistent with steatosis. No hepatic masses identified. Portal vein is patent on color Doppler imaging with normal direction of blood flow towards the liver. Other: None. IMPRESSION: Prior cholecystectomy. Mild dilatation of common bile duct measuring 9 mm. Suggest correlation with liver function tests, and consider abdomen MRI and MRCP without and with contrast if clinically warranted. Hepatic steatosis. Electronically Signed   By: Norleen DELENA Kil M.D.   On: 04/02/2024 08:32   CT Angio Chest/Abd/Pel for Dissection W and/or Wo Contrast Result Date: 04/02/2024 EXAM: CTA CHEST, ABDOMEN AND PELVIS WITH AND WITHOUT CONTRAST 04/02/2024 05:45:45 AM TECHNIQUE: CTA of the chest was performed with and without the administration of intravenous contrast. CTA of the abdomen and pelvis was performed with the administration of intravenous contrast. Multiplanar reformatted images are provided for review. MIP images are provided for review. Automated exposure control, iterative reconstruction, and/or weight based adjustment of the mA/kV was utilized to reduce the radiation dose to as low as reasonably achievable. COMPARISON: PA and lateral chest 12/29/2020, and partial chest CT with contrast for heart morphology and coronary CTA dated 05/12/2020. CLINICAL HISTORY: Sternal chest pain radiating posteriorly 2 episodes beginning yesterday evening and recurring this morning. Aortic aneurysm or dissection suspected. FINDINGS: VASCULATURE: AORTA: The thoracic aorta is normal in course and caliber. There is no aneurysm, stenosis, or dissection. There is trace calcific plaque in the proximal descending aorta, but no other thoracic aortic calcification. There are no calcifications in the abdominal aorta. No abdominal aortic aneurysm. PULMONARY ARTERIES: The pulmonary trunk measures prominent, 3.3 cm suggesting arterial hypertension, was previously 2.6 cm. No arterial embolism is seen to the segmental level. GREAT VESSELS OF AORTIC ARCH: No acute finding. No dissection. No arterial occlusion or significant stenosis. CELIAC TRUNK: No acute finding. No occlusion or significant stenosis. SUPERIOR MESENTERIC ARTERY: No acute finding. No occlusion or significant stenosis. INFERIOR MESENTERIC ARTERY: No acute finding. No occlusion or significant stenosis. RENAL ARTERIES: No acute finding. No occlusion or significant stenosis. ILIAC ARTERIES: No acute finding. No occlusion or  significant stenosis. CHEST: MEDIASTINUM: No mediastinal lymphadenopathy. The heart and pericardium demonstrate no acute abnormality. LUNGS AND PLEURA: There is coarse chronic linear scarring in the lingular base and a small amount also chronically in the medial right middle lobe. Additional linear scarring anteriorly in the right upper lobe. There are 2 chronic fissural nodules each measuring 6 mm in the right  mid lung on axial 63 of series 7, stable, most likely intrapulmonary lymph nodes. No follow-up imaging is recommended. There is mild chronic elevation of the right hemidiaphragm. There are no further nodules. There is no consolidation, effusion, or pneumothorax. THORACIC BONES AND SOFT TISSUES: No acute bone or soft tissue abnormality. ABDOMEN AND PELVIS: LIVER: The liver is 18 cm in length, moderately steatotic with a cirrhotic configuration and capsular nodularity over portions consistent with cirrhosis. There is a prominent hepatic portal main vein measuring 16.3 mm. There is no mass enhancement throughout the liver. GALLBLADDER AND BILE DUCTS: The patient is status post cholecystectomy. There is a prominent common bile duct measuring 12 mm without a visible filling defect. This is probably physiologic related to the cholecystectomy. There is mild intrahepatic bile duct dilatation. Pneumobilia is also noted, most likely due to prior sphincterotomy, occasionally may be seen with infection. SPLEEN: The spleen is mildly prominent, measuring 13.3 cm, without focal mass. PANCREAS: The pancreas is unremarkable. ADRENAL GLANDS: Bilateral adrenal glands demonstrate no acute abnormality. KIDNEYS, URETERS AND BLADDER: No stones in the kidneys or ureters. No hydronephrosis. No perinephric or periureteral stranding. Urinary bladder is unremarkable. GI AND BOWEL: Stomach and duodenal sweep demonstrate no acute abnormality. There is no bowel obstruction. No abnormal bowel wall thickening or distension. There is colonic  diverticulosis with no evidence of colitis or diverticulitis. The appendix is normal. REPRODUCTIVE: Uterus is absent with no adnexal mass. PERITONEUM AND RETROPERITONEUM: No ascites or free air. LYMPH NODES: No lymphadenopathy. ABDOMINAL BONES AND SOFT TISSUES: There are L5 pars defects with chronic appearance, chronic L5-S1 disc collapse, and a 9 mm grade 1 L5-S1 spondylolisthesis with severe secondary foraminal stenosis. No acute or other significant osseous findings. There is umbilical level rectal diastasis and a small umbilical hernia containing fat. There is no incarcerated hernia. No acute soft tissue abnormality. IMPRESSION: 1. No evidence of aortic aneurysm or dissection. 2. Prominent pulmonary trunk measuring 3.3 cm, increased from 2.6 cm, suggesting pulmonary arterial hypertension. No pulmonary embolism to the segmental level. 3. Moderately steatotic liver with cirrhotic configuration and capsular nodularity, slight splenomegaly and prominence in the hepatic portal vein . No hepatic mass enhancement. Mild intrahepatic bile duct dilatation and pneumobilia, likely post-sphincterotomy. 4. Colonic diverticulosis without evidence of colitis or diverticulitis. 5. 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 Electronically signed by: Francis Quam MD 04/02/2024 06:26 AM EDT RP Workstation: [id number]    Lab Results: Recent Labs    04/02/24 0416 04/03/24 0928  WBC 10.8* 6.4  HGB 14.4 12.9  HCT 42.4 38.2  PLT 163 144*   BMET Recent Labs    04/02/24 0416 04/03/24 0928  NA 136 137  K 2.9* 2.5*  CL 94* 97*  CO2 27 30  GLUCOSE 155* 125*  BUN 11 7  CREATININE 0.92 0.76  CALCIUM 8.8* 8.2*   LFT Recent Labs    04/02/24 1126 04/03/24 0928  PROT  --  5.8*  ALBUMIN  --  2.9*  AST  --  135*  ALT  --  209*  ALKPHOS  --  105  BILITOT 4.2* 4.1*  BILIDIR 2.5*  --   IBILI 1.7*  --     PT/INR Recent Labs    04/02/24 1126  LABPROT 14.9  INR 1.1     Scheduled Meds:  [START ON 04/05/2024] enoxaparin (LOVENOX) injection  60 mg Subcutaneous Q24H   loratadine  10  mg Oral Daily   potassium chloride  40 mEq Oral Q4H   venlafaxine  XR  150 mg Oral Q breakfast   Continuous Infusions:  sodium chloride  100 mL/hr at 04/03/24 0024   sodium chloride        Impression:   Elevated LFTs Epigastric pain History of cholecystectomy 1989 s/p ERCP with sphincterotomy and stone extraction in 1993 in 1997. CTA chest abdomen pelvis showed pulmonary hypertension, possible cirrhotic liver, CBD 12 mm without visible filling defect, mild intrahepatic bile duct dilation with pneumobilia AST 135/ALT 209/alk phos 105 T. bili 4.1 (direct > indirect) Resolution of leukocytosis Normal lipase MRCP positive for choledocholithiasis   Diabetes   Plan:   - Daily CBC, CMET -Continue supportive care -Continue pain control. - npo midnight - Will need to hold VTE for at least 6 hours prior to procedure - Plan for ERCP -I thoroughly discussed procedure with the patient to include nature, alternatives, benefits, and risks (including but not limited to post ERCP pancreatitis, bleeding, infection, perforation, anesthesia/cardiac pulmonary complications). Discussed risk of pancreatitis associated with ERCP. Patient verbalized understanding and gave verbal consent to proceed with ERCP.   Jody Jensen  04/03/2024, 1:51 PM  ------------------------------------------------------------------------------------------------------------  I have taken a history, reviewed the chart and examined the patient. I performed a substantive portion of this encounter, including complete performance of at least one of the key components, in conjunction with the APP. I agree with the APP's note, impression and recommendations  Patient has not had any further attacks of her pain.  Aminotransferases downtrending,  T. bili remains 4.1. Patient is scheduled for ERCP with Dr. Saintclair tomorrow.  I reviewed this procedure with her in depth with her and we discussed potential complications to include pancreatitis.  Given her recurrent problems with biliary stones, she would likely benefit from empiric ursodiol to reduce stone formation in the future.  She was in agreement with this. Recommend she start taking ursodiol 300 mg twice daily as an outpatient. Hold Lovenox tomorrow morning.  Okla Qazi E. Stacia, MD [facility]

## 2024-04-03 NOTE — Anesthesia Preprocedure Evaluation (Signed)
 Anesthesia Evaluation  Patient identified by MRN, date of birth, ID band Patient awake    Reviewed: Allergy & Precautions, NPO status , Patient's Chart, lab work & pertinent test results  History of Anesthesia Complications Negative for: history of anesthetic complications  Airway Mallampati: II  TM Distance: >3 FB Neck ROM: Full    Dental  (+) Dental Advisory Given   Pulmonary asthma , sleep apnea and Continuous Positive Airway Pressure Ventilation , COPD,  COPD inhaler, former smoker   breath sounds clear to auscultation       Cardiovascular hypertension, Pt. on medications (-) CAD  Rhythm:Regular Rate:Normal     Neuro/Psych   Anxiety     negative neurological ROS     GI/Hepatic Neg liver ROS,GERD  Medicated and Controlled,,Elevated LFTs   Endo/Other  diabetes (glu 132)  BMI 48  Renal/GU negative Renal ROS     Musculoskeletal   Abdominal   Peds  Hematology Hb 12.9, plt 144k   Anesthesia Other Findings   Reproductive/Obstetrics                              Anesthesia Physical Anesthesia Plan  ASA: 3  Anesthesia Plan: General   Post-op Pain Management: Minimal or no pain anticipated   Induction: Intravenous  PONV Risk Score and Plan: 3 and Ondansetron, Dexamethasone and Scopolamine patch - Pre-op  Airway Management Planned: Oral ETT  Additional Equipment: None  Intra-op Plan:   Post-operative Plan: Extubation in OR  Informed Consent: I have reviewed the patients History and Physical, chart, labs and discussed the procedure including the risks, benefits and alternatives for the proposed anesthesia with the patient or authorized representative who has indicated his/her understanding and acceptance.     Dental advisory given  Plan Discussed with: CRNA and Surgeon  Anesthesia Plan Comments:          Anesthesia Quick Evaluation

## 2024-04-03 NOTE — Progress Notes (Addendum)
 Progress Note   LOS: 1 day   Chief Complaint: Choledocholithiasis   Subjective   Patient states she is doing really well today and has no pain.  Tolerating diet without difficulty.  Denies nausea/vomiting.  Husband is at bedside.   Objective   Vital signs in last 24 hours: Temp:  [98.5 F (36.9 C)-100.1 F (37.8 C)] 98.5 F (36.9 C) (11/01 1005) Pulse Rate:  [72-94] 72 (11/01 1005) Resp:  [16-18] 16 (11/01 1005) BP: (122-130)/(65-75) 122/75 (11/01 1005) SpO2:  [92 %-93 %] 93 % (11/01 1005) Weight:  [127.3 kg] 127.3 kg (11/01 0023) Last BM Date : 04/01/24 Last BM recorded by nurses in past 5 days No data recorded  General:   female in no acute distress Heart:  Regular rate and rhythm; no murmurs Pulm: Clear anteriorly; no wheezing Abdomen: soft, nondistended, normal bowel sounds in all quadrants. Nontender without guarding. No organomegaly appreciated. Extremities:  No edema Neurologic:  Alert and  oriented x4;  No focal deficits.  Psych:  Cooperative. Normal mood and affect.  Intake/Output from previous day: 10/31 0701 - 11/01 0700 In: 1266.4 [P.O.:120; I.V.:1046.4; IV Piggyback:100] Out: -  Intake/Output this shift: No intake/output data recorded.  Studies/Results: MR ABDOMEN MRCP W WO CONTAST Result Date: 04/02/2024 CLINICAL DATA:  Elevated liver function tests EXAM: MRI ABDOMEN WITHOUT AND WITH CONTRAST (INCLUDING MRCP) TECHNIQUE: Multiplanar multisequence MR imaging of the abdomen was performed both before and after the administration of intravenous contrast. Heavily T2-weighted images of the biliary and pancreatic ducts were obtained. Post-processing was applied at the acquisition scanner with concurrent physician supervision which includes 3D reconstructions, MIPs, volume rendered images and/or shaded surface rendering. CONTRAST:  10mL GADAVIST  GADOBUTROL  1 MMOL/ML IV SOLN COMPARISON:  Earlier same day CTA abdomen and pelvis and ultrasound abdomen FINDINGS: Lower  chest: No acute findings. Hepatobiliary: Steatosis. Heterogeneous arterial parenchymal enhancement. Mild intrahepatic bile duct dilation. Common bile duct measures 9 mm. Dependent filling defects within the downstream common bile duct measuring up to 6 mm (13: 30, 31). Cholecystectomy. Pancreas: No mass, inflammatory changes, or other parenchymal abnormality identified. Spleen:  Within normal limits in size and appearance. Adrenals/Urinary Tract: No adrenal nodules. No suspicious renal masses identified. No evidence of hydronephrosis. Stomach/Bowel: Visualized portions within the abdomen are unremarkable. Normal appendix. Vascular/Lymphatic: Prominent periportal lymph nodes measuring up to 14 mm (4:25). No abdominal aortic aneurysm demonstrated. Other:  None. Musculoskeletal: No suspicious bone lesions identified. IMPRESSION: 1. Mild intrahepatic and extrahepatic bile duct dilation with dependent filling defects within the downstream common bile duct measuring up to 6 mm, suspicious for choledocholithiasis. 2. Hepatic steatosis. 3. Prominent periportal lymph nodes measuring up to 14 mm, likely reactive. Electronically Signed   By: Limin  Xu M.D.   On: 04/02/2024 17:15   MR 3D Recon At Scanner Result Date: 04/02/2024 CLINICAL DATA:  Elevated liver function tests EXAM: MRI ABDOMEN WITHOUT AND WITH CONTRAST (INCLUDING MRCP) TECHNIQUE: Multiplanar multisequence MR imaging of the abdomen was performed both before and after the administration of intravenous contrast. Heavily T2-weighted images of the biliary and pancreatic ducts were obtained. Post-processing was applied at the acquisition scanner with concurrent physician supervision which includes 3D reconstructions, MIPs, volume rendered images and/or shaded surface rendering. CONTRAST:  10mL GADAVIST  GADOBUTROL  1 MMOL/ML IV SOLN COMPARISON:  Earlier same day CTA abdomen and pelvis and ultrasound abdomen FINDINGS: Lower chest: No acute findings. Hepatobiliary:  Steatosis. Heterogeneous arterial parenchymal enhancement. Mild intrahepatic bile duct dilation. Common bile duct measures 9 mm. Dependent  filling defects within the downstream common bile duct measuring up to 6 mm (13: 30, 31). Cholecystectomy. Pancreas: No mass, inflammatory changes, or other parenchymal abnormality identified. Spleen:  Within normal limits in size and appearance. Adrenals/Urinary Tract: No adrenal nodules. No suspicious renal masses identified. No evidence of hydronephrosis. Stomach/Bowel: Visualized portions within the abdomen are unremarkable. Normal appendix. Vascular/Lymphatic: Prominent periportal lymph nodes measuring up to 14 mm (4:25). No abdominal aortic aneurysm demonstrated. Other:  None. Musculoskeletal: No suspicious bone lesions identified. IMPRESSION: 1. Mild intrahepatic and extrahepatic bile duct dilation with dependent filling defects within the downstream common bile duct measuring up to 6 mm, suspicious for choledocholithiasis. 2. Hepatic steatosis. 3. Prominent periportal lymph nodes measuring up to 14 mm, likely reactive. Electronically Signed   By: Limin  Xu M.D.   On: 04/02/2024 17:15   US  Abdomen Limited Result Date: 04/02/2024 CLINICAL DATA:  Right upper quadrant pain.  Prior cholecystectomy. EXAM: ULTRASOUND ABDOMEN LIMITED RIGHT UPPER QUADRANT COMPARISON:  02/22/2020 FINDINGS: Gallbladder: Surgically absent. Common bile duct: Diameter: 9 mm, which is mildly dilated. Liver: Diffusely increased parenchymal echogenicity again seen, consistent with steatosis. No hepatic masses identified. Portal vein is patent on color Doppler imaging with normal direction of blood flow towards the liver. Other: None. IMPRESSION: Prior cholecystectomy. Mild dilatation of common bile duct measuring 9 mm. Suggest correlation with liver function tests, and consider abdomen MRI and MRCP without and with contrast if clinically warranted. Hepatic steatosis. Electronically Signed   By: Norleen DELENA Kil M.D.   On: 04/02/2024 08:32   CT Angio Chest/Abd/Pel for Dissection W and/or Wo Contrast Result Date: 04/02/2024 EXAM: CTA CHEST, ABDOMEN AND PELVIS WITH AND WITHOUT CONTRAST 04/02/2024 05:45:45 AM TECHNIQUE: CTA of the chest was performed with and without the administration of intravenous contrast. CTA of the abdomen and pelvis was performed with the administration of intravenous contrast. Multiplanar reformatted images are provided for review. MIP images are provided for review. Automated exposure control, iterative reconstruction, and/or weight based adjustment of the mA/kV was utilized to reduce the radiation dose to as low as reasonably achievable. COMPARISON: PA and lateral chest 12/29/2020, and partial chest CT with contrast for heart morphology and coronary CTA dated 05/12/2020. CLINICAL HISTORY: Sternal chest pain radiating posteriorly 2 episodes beginning yesterday evening and recurring this morning. Aortic aneurysm or dissection suspected. FINDINGS: VASCULATURE: AORTA: The thoracic aorta is normal in course and caliber. There is no aneurysm, stenosis, or dissection. There is trace calcific plaque in the proximal descending aorta, but no other thoracic aortic calcification. There are no calcifications in the abdominal aorta. No abdominal aortic aneurysm. PULMONARY ARTERIES: The pulmonary trunk measures prominent, 3.3 cm suggesting arterial hypertension, was previously 2.6 cm. No arterial embolism is seen to the segmental level. GREAT VESSELS OF AORTIC ARCH: No acute finding. No dissection. No arterial occlusion or significant stenosis. CELIAC TRUNK: No acute finding. No occlusion or significant stenosis. SUPERIOR MESENTERIC ARTERY: No acute finding. No occlusion or significant stenosis. INFERIOR MESENTERIC ARTERY: No acute finding. No occlusion or significant stenosis. RENAL ARTERIES: No acute finding. No occlusion or significant stenosis. ILIAC ARTERIES: No acute finding. No occlusion or  significant stenosis. CHEST: MEDIASTINUM: No mediastinal lymphadenopathy. The heart and pericardium demonstrate no acute abnormality. LUNGS AND PLEURA: There is coarse chronic linear scarring in the lingular base and a small amount also chronically in the medial right middle lobe. Additional linear scarring anteriorly in the right upper lobe. There are 2 chronic fissural nodules each measuring 6 mm in the right  mid lung on axial 63 of series 7, stable, most likely intrapulmonary lymph nodes. No follow-up imaging is recommended. There is mild chronic elevation of the right hemidiaphragm. There are no further nodules. There is no consolidation, effusion, or pneumothorax. THORACIC BONES AND SOFT TISSUES: No acute bone or soft tissue abnormality. ABDOMEN AND PELVIS: LIVER: The liver is 18 cm in length, moderately steatotic with a cirrhotic configuration and capsular nodularity over portions consistent with cirrhosis. There is a prominent hepatic portal main vein measuring 16.3 mm. There is no mass enhancement throughout the liver. GALLBLADDER AND BILE DUCTS: The patient is status post cholecystectomy. There is a prominent common bile duct measuring 12 mm without a visible filling defect. This is probably physiologic related to the cholecystectomy. There is mild intrahepatic bile duct dilatation. Pneumobilia is also noted, most likely due to prior sphincterotomy, occasionally may be seen with infection. SPLEEN: The spleen is mildly prominent, measuring 13.3 cm, without focal mass. PANCREAS: The pancreas is unremarkable. ADRENAL GLANDS: Bilateral adrenal glands demonstrate no acute abnormality. KIDNEYS, URETERS AND BLADDER: No stones in the kidneys or ureters. No hydronephrosis. No perinephric or periureteral stranding. Urinary bladder is unremarkable. GI AND BOWEL: Stomach and duodenal sweep demonstrate no acute abnormality. There is no bowel obstruction. No abnormal bowel wall thickening or distension. There is colonic  diverticulosis with no evidence of colitis or diverticulitis. The appendix is normal. REPRODUCTIVE: Uterus is absent with no adnexal mass. PERITONEUM AND RETROPERITONEUM: No ascites or free air. LYMPH NODES: No lymphadenopathy. ABDOMINAL BONES AND SOFT TISSUES: There are L5 pars defects with chronic appearance, chronic L5-S1 disc collapse, and a 9 mm grade 1 L5-S1 spondylolisthesis with severe secondary foraminal stenosis. No acute or other significant osseous findings. There is umbilical level rectal diastasis and a small umbilical hernia containing fat. There is no incarcerated hernia. No acute soft tissue abnormality. IMPRESSION: 1. No evidence of aortic aneurysm or dissection. 2. Prominent pulmonary trunk measuring 3.3 cm, increased from 2.6 cm, suggesting pulmonary arterial hypertension. No pulmonary embolism to the segmental level. 3. Moderately steatotic liver with cirrhotic configuration and capsular nodularity, slight splenomegaly and prominence in the hepatic portal vein . No hepatic mass enhancement. Mild intrahepatic bile duct dilatation and pneumobilia, likely post-sphincterotomy. 4. Colonic diverticulosis without evidence of colitis or diverticulitis. 5. 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 Electronically signed by: Francis Quam MD 04/02/2024 06:26 AM EDT RP Workstation: [id number]    Lab Results: Recent Labs    04/02/24 0416 04/03/24 0928  WBC 10.8* 6.4  HGB 14.4 12.9  HCT 42.4 38.2  PLT 163 144*   BMET Recent Labs    04/02/24 0416 04/03/24 0928  NA 136 137  K 2.9* 2.5*  CL 94* 97*  CO2 27 30  GLUCOSE 155* 125*  BUN 11 7  CREATININE 0.92 0.76  CALCIUM 8.8* 8.2*   LFT Recent Labs    04/02/24 1126 04/03/24 0928  PROT  --  5.8*  ALBUMIN  --  2.9*  AST  --  135*  ALT  --  209*  ALKPHOS  --  105  BILITOT 4.2* 4.1*  BILIDIR 2.5*  --   IBILI 1.7*  --     PT/INR Recent Labs    04/02/24 1126  LABPROT 14.9  INR 1.1     Scheduled Meds:  [START ON 04/05/2024] enoxaparin (LOVENOX) injection  60 mg Subcutaneous Q24H   loratadine  10  mg Oral Daily   potassium chloride  40 mEq Oral Q4H   venlafaxine  XR  150 mg Oral Q breakfast   Continuous Infusions:  sodium chloride  100 mL/hr at 04/03/24 0024   sodium chloride        Impression:   Elevated LFTs Epigastric pain History of cholecystectomy 1989 s/p ERCP with sphincterotomy and stone extraction in 1993 in 1997. CTA chest abdomen pelvis showed pulmonary hypertension, possible cirrhotic liver, CBD 12 mm without visible filling defect, mild intrahepatic bile duct dilation with pneumobilia AST 135/ALT 209/alk phos 105 T. bili 4.1 (direct > indirect) Resolution of leukocytosis Normal lipase MRCP positive for choledocholithiasis   Diabetes   Plan:   - Daily CBC, CMET -Continue supportive care -Continue pain control. - npo midnight - Will need to hold VTE for at least 6 hours prior to procedure - Plan for ERCP -I thoroughly discussed procedure with the patient to include nature, alternatives, benefits, and risks (including but not limited to post ERCP pancreatitis, bleeding, infection, perforation, anesthesia/cardiac pulmonary complications). Discussed risk of pancreatitis associated with ERCP. Patient verbalized understanding and gave verbal consent to proceed with ERCP.   Jody Jensen  04/03/2024, 1:51 PM  ------------------------------------------------------------------------------------------------------------  I have taken a history, reviewed the chart and examined the patient. I performed a substantive portion of this encounter, including complete performance of at least one of the key components, in conjunction with the APP. I agree with the APP's note, impression and recommendations  Patient has not had any further attacks of her pain.  Aminotransferases downtrending,  T. bili remains 4.1. Patient is scheduled for ERCP with Dr. Saintclair tomorrow.  I reviewed this procedure with her in depth with her and we discussed potential complications to include pancreatitis.  Given her recurrent problems with biliary stones, she would likely benefit from empiric ursodiol to reduce stone formation in the future.  She was in agreement with this. Recommend she start taking ursodiol 300 mg twice daily as an outpatient. Hold Lovenox tomorrow morning.  Okla Qazi E. Stacia, MD [facility]

## 2024-04-03 NOTE — Plan of Care (Signed)
  Problem: Clinical Measurements: Goal: Ability to maintain clinical measurements within normal limits will improve Outcome: Progressing   Problem: Clinical Measurements: Goal: Diagnostic test results will improve Outcome: Progressing   Problem: Pain Managment: Goal: General experience of comfort will improve and/or be controlled Outcome: Progressing   Problem: Skin Integrity: Goal: Risk for impaired skin integrity will decrease Outcome: Progressing

## 2024-04-04 ENCOUNTER — Inpatient Hospital Stay (HOSPITAL_COMMUNITY): Admitting: Anesthesiology

## 2024-04-04 ENCOUNTER — Encounter (HOSPITAL_COMMUNITY): Admission: EM | Disposition: A | Payer: Self-pay | Source: Home / Self Care | Attending: Internal Medicine

## 2024-04-04 ENCOUNTER — Inpatient Hospital Stay (HOSPITAL_COMMUNITY)

## 2024-04-04 ENCOUNTER — Encounter (HOSPITAL_COMMUNITY): Payer: Self-pay | Admitting: Hospitalist

## 2024-04-04 DIAGNOSIS — Z87891 Personal history of nicotine dependence: Secondary | ICD-10-CM | POA: Diagnosis not present

## 2024-04-04 DIAGNOSIS — I10 Essential (primary) hypertension: Secondary | ICD-10-CM

## 2024-04-04 DIAGNOSIS — R1013 Epigastric pain: Secondary | ICD-10-CM | POA: Diagnosis not present

## 2024-04-04 DIAGNOSIS — E876 Hypokalemia: Secondary | ICD-10-CM | POA: Diagnosis not present

## 2024-04-04 DIAGNOSIS — R7989 Other specified abnormal findings of blood chemistry: Secondary | ICD-10-CM | POA: Diagnosis not present

## 2024-04-04 DIAGNOSIS — K8050 Calculus of bile duct without cholangitis or cholecystitis without obstruction: Secondary | ICD-10-CM | POA: Diagnosis not present

## 2024-04-04 HISTORY — PX: ERCP: SHX5425

## 2024-04-04 LAB — COMPREHENSIVE METABOLIC PANEL WITH GFR
ALT: 184 U/L — ABNORMAL HIGH (ref 0–44)
AST: 102 U/L — ABNORMAL HIGH (ref 15–41)
Albumin: 3 g/dL — ABNORMAL LOW (ref 3.5–5.0)
Alkaline Phosphatase: 118 U/L (ref 38–126)
Anion gap: 11 (ref 5–15)
BUN: 10 mg/dL (ref 6–20)
CO2: 28 mmol/L (ref 22–32)
Calcium: 8.6 mg/dL — ABNORMAL LOW (ref 8.9–10.3)
Chloride: 100 mmol/L (ref 98–111)
Creatinine, Ser: 0.77 mg/dL (ref 0.44–1.00)
GFR, Estimated: >60 mL/min (ref >60)
Glucose, Bld: 112 mg/dL — ABNORMAL HIGH (ref 70–99)
Potassium: 3.2 mmol/L — ABNORMAL LOW (ref 3.5–5.1)
Sodium: 139 mmol/L (ref 135–145)
Total Bilirubin: 2.1 mg/dL — ABNORMAL HIGH (ref 0.0–1.2)
Total Protein: 6.1 g/dL — ABNORMAL LOW (ref 6.5–8.1)

## 2024-04-04 LAB — CBC
HCT: 39.9 % (ref 36.0–46.0)
Hemoglobin: 13.6 g/dL (ref 12.0–15.0)
MCH: 29.8 pg (ref 26.0–34.0)
MCHC: 34.1 g/dL (ref 30.0–36.0)
MCV: 87.5 fL (ref 80.0–100.0)
Platelets: 154 K/uL (ref 150–400)
RBC: 4.56 MIL/uL (ref 3.87–5.11)
RDW: 14.7 % (ref 11.5–15.5)
WBC: 4.9 K/uL (ref 4.0–10.5)
nRBC: 0 % (ref 0.0–0.2)

## 2024-04-04 LAB — GLUCOSE, CAPILLARY: Glucose-Capillary: 132 mg/dL — ABNORMAL HIGH (ref 70–99)

## 2024-04-04 SURGERY — ERCP, WITH INTERVENTION IF INDICATED
Anesthesia: General

## 2024-04-04 MED ORDER — FENTANYL CITRATE (PF) 250 MCG/5ML IJ SOLN
INTRAMUSCULAR | Status: DC | PRN
  Administered 2024-04-04: 100 ug via INTRAVENOUS

## 2024-04-04 MED ORDER — POTASSIUM CHLORIDE CRYS ER 20 MEQ PO TBCR
40.0000 meq | EXTENDED_RELEASE_TABLET | Freq: Once | ORAL | Status: AC
  Administered 2024-04-04: 40 meq via ORAL
  Filled 2024-04-04: qty 2

## 2024-04-04 MED ORDER — MIDAZOLAM HCL 2 MG/2ML IJ SOLN
INTRAMUSCULAR | Status: AC
  Filled 2024-04-04: qty 2

## 2024-04-04 MED ORDER — SCOPOLAMINE 1 MG/3DAYS TD PT72
MEDICATED_PATCH | TRANSDERMAL | Status: DC | PRN
  Administered 2024-04-04: 1 via TRANSDERMAL

## 2024-04-04 MED ORDER — DICLOFENAC SUPPOSITORY 100 MG
RECTAL | Status: DC | PRN
  Administered 2024-04-04: 100 mg via RECTAL

## 2024-04-04 MED ORDER — MEPERIDINE HCL 25 MG/ML IJ SOLN
6.2500 mg | INTRAMUSCULAR | Status: DC | PRN
  Filled 2024-04-04: qty 1

## 2024-04-04 MED ORDER — SCOPOLAMINE 1 MG/3DAYS TD PT72
MEDICATED_PATCH | TRANSDERMAL | Status: AC
  Filled 2024-04-04: qty 1

## 2024-04-04 MED ORDER — LIDOCAINE HCL (CARDIAC) PF 100 MG/5ML IV SOSY
PREFILLED_SYRINGE | INTRAVENOUS | Status: DC | PRN
  Administered 2024-04-04: 30 mg via INTRAVENOUS

## 2024-04-04 MED ORDER — LACTATED RINGERS IV SOLN: INTRAVENOUS | Status: DC | PRN

## 2024-04-04 MED ORDER — ONDANSETRON HCL 4 MG/2ML IJ SOLN
INTRAMUSCULAR | Status: DC | PRN
  Administered 2024-04-04: 4 mg via INTRAVENOUS

## 2024-04-04 MED ORDER — EPINEPHRINE 1 MG/10ML IV SOSY
PREFILLED_SYRINGE | INTRAVENOUS | Status: AC
Start: 2024-04-04 — End: 2024-04-04
  Filled 2024-04-04: qty 10

## 2024-04-04 MED ORDER — FENTANYL CITRATE (PF) 100 MCG/2ML IJ SOLN
INTRAMUSCULAR | Status: AC
  Filled 2024-04-04: qty 2

## 2024-04-04 MED ORDER — DICLOFENAC SUPPOSITORY 100 MG
RECTAL | Status: AC
Start: 2024-04-04 — End: 2024-04-04
  Filled 2024-04-04: qty 1

## 2024-04-04 MED ORDER — FENTANYL CITRATE (PF) 100 MCG/2ML IJ SOLN: 25.0000 ug | INTRAMUSCULAR | Status: DC | PRN

## 2024-04-04 MED ORDER — OXYCODONE HCL 5 MG/5ML PO SOLN: 5.0000 mg | Freq: Once | ORAL | Status: DC | PRN

## 2024-04-04 MED ORDER — OXYCODONE HCL 5 MG PO TABS: 5.0000 mg | ORAL_TABLET | Freq: Once | ORAL | Status: DC | PRN

## 2024-04-04 MED ORDER — CIPROFLOXACIN IN D5W 400 MG/200ML IV SOLN
INTRAVENOUS | Status: DC | PRN
Start: 2024-04-04 — End: 2024-04-04
  Administered 2024-04-04: 400 mg via INTRAVENOUS

## 2024-04-04 MED ORDER — ROCURONIUM BROMIDE 10 MG/ML (PF) SYRINGE
PREFILLED_SYRINGE | INTRAVENOUS | Status: DC | PRN
  Administered 2024-04-04: 60 mg via INTRAVENOUS

## 2024-04-04 MED ORDER — PROPOFOL 10 MG/ML IV BOLUS
INTRAVENOUS | Status: DC | PRN
  Administered 2024-04-04: 200 mg via INTRAVENOUS

## 2024-04-04 MED ORDER — SUGAMMADEX SODIUM 200 MG/2ML IV SOLN
INTRAVENOUS | Status: DC | PRN
  Administered 2024-04-04: 300 mg via INTRAVENOUS

## 2024-04-04 MED ORDER — GLUCAGON HCL RDNA (DIAGNOSTIC) 1 MG IJ SOLR
INTRAMUSCULAR | Status: AC
  Filled 2024-04-04: qty 2

## 2024-04-04 MED ORDER — CIPROFLOXACIN IN D5W 400 MG/200ML IV SOLN
INTRAVENOUS | Status: AC
Start: 2024-04-04 — End: 2024-04-04
  Filled 2024-04-04: qty 200

## 2024-04-04 MED ORDER — DEXAMETHASONE SOD PHOSPHATE PF 10 MG/ML IJ SOLN
INTRAMUSCULAR | Status: DC | PRN
  Administered 2024-04-04: 10 mg via INTRAVENOUS

## 2024-04-04 MED ORDER — MIDAZOLAM HCL (PF) 2 MG/2ML IJ SOLN: 0.5000 mg | Freq: Once | INTRAMUSCULAR | Status: DC | PRN

## 2024-04-04 MED ORDER — SODIUM CHLORIDE 0.9 % IV SOLN
INTRAVENOUS | Status: DC | PRN
  Administered 2024-04-04: 20 mL

## 2024-04-04 NOTE — Interval H&P Note (Signed)
 History and Physical Interval Note: 53 year old female history of cholecystectomy in 1989, status post ERCP, sphincterotomy and stone extraction and 1993 and 1997 with CT showing possible cirrhosis, CBD 12 mm and MRCP showing choledocholithiasis, elevated LFTs, for ERCP with propofol.  04/04/2024 7:31 AM  [name]  has presented today for ERCP with propofol, with the diagnosis of Choledocholithiasis.  The various methods of treatment have been discussed with the patient and family. After consideration of risks, benefits and other options for treatment, the patient has consented to  Procedure(s): ERCP, WITH INTERVENTION IF INDICATED (N/A) as a surgical intervention.  The patient's history has been reviewed, patient examined, no change in status, stable for surgery.  I have reviewed the patient's chart and labs.  Questions were answered to the patient's satisfaction.     Jody Jensen

## 2024-04-04 NOTE — Transfer of Care (Signed)
 Immediate Anesthesia Transfer of Care Note  Patient: Jody Jensen  Procedure(s) Performed: ERCP, WITH INTERVENTION IF INDICATED  Patient Location: PACU  Anesthesia Type:General  Level of Consciousness: awake, alert , and oriented  Airway & Oxygen Therapy: Patient Spontanous Breathing and Patient connected to nasal cannula oxygen  Post-op Assessment: Report given to RN and Post -op Vital signs reviewed and stable  Post vital signs: Reviewed and stable  Last Vitals:  Vitals Value Taken Time  BP 127/80 04/04/24 09:23  Temp 36.6 C 04/04/24 09:23  Pulse 70 04/04/24 09:24  Resp 19 04/04/24 09:24  SpO2 93 % 04/04/24 09:24  Vitals shown include unfiled device data.  Last Pain:  Vitals:   04/04/24 0915  TempSrc:   PainSc: 0-No pain         Complications: No notable events documented.

## 2024-04-04 NOTE — Progress Notes (Signed)
 Discharge Nurse Summary: DC order noted per MD. DC RN at bedside with patient. Patient agreeable with discharge plan, states family will arrive soon for pickup. AVS printed/reviewed. PIV removed, skin intact. No DME needs. No home/TOC meds. CP/Edu resolved. Telemonitor not present on assessment. All belongings accounted for. Patient wheeled downstairs for discharge by private auto.   Rosario EMERSON Lund, RN

## 2024-04-04 NOTE — Op Note (Signed)
 [facility] Patient Name: Jody Jensen Procedure Date : 04/04/2024 MRN: [id number] Attending MD: Estelita Manas , MD, [id number] Date of Birth: May 07, 1971 CSN: [id number] Age: 53 Admit Type: Inpatient Procedure:                ERCP Indications:              Common bile duct stones, Abnormal MRCP, Elevated                            liver enzymes Providers:                Estelita Manas, MD, Collene Edu, RN, Corene Southgate,                            Technician Referring MD:             [facility] Medicines:                Monitored Anesthesia Care Complications:            No immediate complications. Estimated Blood Loss:     Estimated blood loss: none. Procedure:                Pre-Anesthesia Assessment:                           - Prior to the procedure, a History and Physical                            was performed, and patient medications and                            allergies were reviewed. The patient's tolerance of                            previous anesthesia was also reviewed. The risks                            and benefits of the procedure and the sedation                            options and risks were discussed with the patient.                            All questions were answered, and informed consent                            was obtained. Prior Anticoagulants: The patient has                            taken no anticoagulant or antiplatelet agents. ASA                            Grade Assessment: III - A patient with severe  systemic disease. After reviewing the risks and                            benefits, the patient was deemed in satisfactory                            condition to undergo the procedure.                           After obtaining informed consent, the scope was                            passed under direct vision. Throughout the                            procedure, the patient's blood pressure, pulse,  and                            oxygen saturations were monitored continuously. The                            [id number]-Q190V ([id number] Olympus duodenoscope was                            introduced through the mouth, and used to inject                            contrast into and used to inject contrast into the                            bile duct. The ERCP was accomplished without                            difficulty. The patient tolerated the procedure                            well. Scope In: Scope Out: Findings:      A scout film of the abdomen was obtained. Surgical clips, consistent       with a previous cholecystectomy, were seen in the area of the right       upper quadrant of the abdomen. The esophagus was successfully intubated       under direct vision. The scope was advanced to a normal major papilla in       the descending duodenum without detailed examination of the pharynx,       larynx and associated structures, and upper GI tract. The upper GI tract       was grossly normal. The bile duct was deeply cannulated with the       sphincterotome. Contrast was injected. I personally interpreted the bile       duct images. There was brisk flow of contrast through the ducts. Image       quality was excellent. Contrast extended to the main bile duct. The       lower third of the main bile duct contained stones. The main bile duct  was mildly dilated. The largest diameter was 12 mm. A cholecystectomy       had been performed. A straight Roadrunner wire was passed into the       biliary tree.      Prior sphincterotomy appeared patent. It was further extended by 5 mm       (biliary sphincterotomy was made with a monofilament sphincterotome       using ERBE electrocautery).      There was no post-sphincterotomy bleeding. The lower third of the main       bile duct contained filling defects thought to be small stones and       sludge.      The biliary tree was swept with a 12 mm  and 15 mm balloon starting at       the bifurcation as well as main right and main left hepatic ducts.      Sludge was swept from the duct. All small stones were removed. Impression:               - Filling defects consistent with stones and sludge                            was seen on the cholangiogram.                           - The entire main bile duct was mildly dilated.                           - The patient has had a cholecystectomy.                           - Choledocholithiasis was found. Complete removal                            was accomplished by biliary sphincterotomy and                            balloon extraction.                           - A biliary sphincterotomy was performed.                           - The biliary tree was swept. Moderate Sedation:      Patient did not receive moderate sedation for this procedure, but       instead received monitored anesthesia care. Recommendation:           - Advance diet as tolerated. Procedure Code(s):        --- Professional ---                           [phone], Endoscopic retrograde                            cholangiopancreatography (ERCP); with removal of                            calculi/debris from biliary/pancreatic duct(s)  [id number], Endoscopic retrograde                            cholangiopancreatography (ERCP); with                            sphincterotomy/papillotomy                           [phone], Endoscopic catheterization of the biliary                            ductal system, radiological supervision and                            interpretation Diagnosis Code(s):        --- Professional ---                           R93.2, Abnormal findings on diagnostic imaging of                            liver and biliary tract                           Z90.49, Acquired absence of other specified parts                            of digestive tract                           K80.50, Calculus  of bile duct without cholangitis                            or cholecystitis without obstruction                           R74.8, Abnormal levels of other serum enzymes                           K83.8, Other specified diseases of biliary tract CPT copyright 2022 [employer]. All rights reserved. The codes documented in this report are preliminary and upon coder review may  be revised to meet current compliance requirements. Estelita Manas, MD 04/04/2024 8:45:47 AM This report has been signed electronically. Number of Addenda: 0

## 2024-04-04 NOTE — Discharge Summary (Signed)
 Physician Discharge Summary  Natallie Ravenscroft FMW:[id number] DOB: Jan 11, 1971 DOA: 04/02/2024  PCP: Orlean Alan HERO, FNP  Admit date: 04/02/2024 Discharge date: 04/04/2024  Admitted From:  Discharge disposition: home   Recommendations for Outpatient Follow-Up:   Status post ERCP-routine follow-up with PCP-CBC/CMP in next visit Outpatient workup of pulmonary arterial hypertension Hydrochlorothiazide  held for hypokalemia and hypotension-defer to PCP to restart   Discharge Diagnosis:   Principal Problem:   Abdominal pain Active Problems:   Obesity, morbid (HCC)   Choledocholithiasis   History of cholecystectomy    Discharge Condition: Improved.  Diet recommendation: Soft  Wound care: None.  Code status: Full.   History of Present Illness:   Jody Jensen is a 53 y.o. female with medical history significant of HTN, HLD, DM2, OSA, and morbid obesity p/w upper abdominal pain.   The patient reported the onset of chest pain around 6:15 to 6:30 PM yesterday. The pain radiated to the back and caused labored breathing. Initially, the patient tried to see if the pain would subside, but it did not. The patient's husband took her to the emergency room, but the pain ceased upon arrival, so they returned home. After going to bed, the pain reawakened the patient a couple of hours later, with intensity as bad as or worse than the initial episode. Concerned about a possible heart attack, the patient called 911, and an ambulance brought her to the hospital. The patient experienced one more episode of pain since being admitted.   In the ED, pt AFVSS. Labs notable for K 2.9, Mg 1.8, AST/ALT 328/266, T bili 4.2, and direct bili 2.5. CT chest/abdomen/pelvis showed no evidence of aortic aneurysm or dissection, but moderately steatotic liver with cirrhotic configuration and prominent pulmonary trunk measuring 3.3 cm, increased from 2.6 cm, suggesting pulmonary arterial hypertension without  pulmonary embolism to the segmental level. EDP consulted GI and requested medicine admission.     Hospital Course by Problem:   Abdominal pain from  choledocholithiasis - GI consulted - ERCP-- Choledocholithiasis was found. Complete removal                            was accomplished by biliary sphincterotomy and                            balloon extraction.                           - A biliary sphincterotomy was performed.                           - The biliary tree was swept.   Hypokalemia -replete -replete Mg as well -Stop hydrochlorothiazide -defer to PCP to restart as blood pressure normotensive to hypotensive   Seasonal allergies - Xyzal  5mg  daily   Mood disorder - velafaxine 150mg  daily   Obesity Estimated body mass index is 48.16 kg/m as calculated from the following:   Height as of 03/17/24: 5' 4 (1.626 m).   Weight as of 03/17/24: 127.3 kg.       Medical Consultants:   GI   Discharge Exam:   Vitals:   04/04/24 0923 04/04/24 0945  BP: 127/80 102/75  Pulse: 69 67  Resp: 13 17  Temp: 97.9 F (36.6 C) 98 F (36.7 C)  SpO2: 95% 94%  Vitals:   04/04/24 0900 04/04/24 0915 04/04/24 0923 04/04/24 0945  BP: 132/79 129/75 127/80 102/75  Pulse: 71 67 69 67  Resp: (!) 21 15 13 17   Temp:   97.9 F (36.6 C) 98 F (36.7 C)  TempSrc:    Oral  SpO2: 91% 94% 95% 94%  Weight:        General exam: Appears calm and comfortable.   The results of significant diagnostics from this hospitalization (including imaging, microbiology, ancillary and laboratory) are listed below for reference.     Procedures and Diagnostic Studies:   MR ABDOMEN MRCP W WO CONTAST Result Date: 04/02/2024 CLINICAL DATA:  Elevated liver function tests EXAM: MRI ABDOMEN WITHOUT AND WITH CONTRAST (INCLUDING MRCP) TECHNIQUE: Multiplanar multisequence MR imaging of the abdomen was performed both before and after the administration of intravenous contrast. Heavily T2-weighted images of  the biliary and pancreatic ducts were obtained. Post-processing was applied at the acquisition scanner with concurrent physician supervision which includes 3D reconstructions, MIPs, volume rendered images and/or shaded surface rendering. CONTRAST:  10mL GADAVIST  GADOBUTROL  1 MMOL/ML IV SOLN COMPARISON:  Earlier same day CTA abdomen and pelvis and ultrasound abdomen FINDINGS: Lower chest: No acute findings. Hepatobiliary: Steatosis. Heterogeneous arterial parenchymal enhancement. Mild intrahepatic bile duct dilation. Common bile duct measures 9 mm. Dependent filling defects within the downstream common bile duct measuring up to 6 mm (13: 30, 31). Cholecystectomy. Pancreas: No mass, inflammatory changes, or other parenchymal abnormality identified. Spleen:  Within normal limits in size and appearance. Adrenals/Urinary Tract: No adrenal nodules. No suspicious renal masses identified. No evidence of hydronephrosis. Stomach/Bowel: Visualized portions within the abdomen are unremarkable. Normal appendix. Vascular/Lymphatic: Prominent periportal lymph nodes measuring up to 14 mm (4:25). No abdominal aortic aneurysm demonstrated. Other:  None. Musculoskeletal: No suspicious bone lesions identified. IMPRESSION: 1. Mild intrahepatic and extrahepatic bile duct dilation with dependent filling defects within the downstream common bile duct measuring up to 6 mm, suspicious for choledocholithiasis. 2. Hepatic steatosis. 3. Prominent periportal lymph nodes measuring up to 14 mm, likely reactive. Electronically Signed   By: Limin  Xu M.D.   On: 04/02/2024 17:15   MR 3D Recon At Scanner Result Date: 04/02/2024 CLINICAL DATA:  Elevated liver function tests EXAM: MRI ABDOMEN WITHOUT AND WITH CONTRAST (INCLUDING MRCP) TECHNIQUE: Multiplanar multisequence MR imaging of the abdomen was performed both before and after the administration of intravenous contrast. Heavily T2-weighted images of the biliary and pancreatic ducts were  obtained. Post-processing was applied at the acquisition scanner with concurrent physician supervision which includes 3D reconstructions, MIPs, volume rendered images and/or shaded surface rendering. CONTRAST:  10mL GADAVIST  GADOBUTROL  1 MMOL/ML IV SOLN COMPARISON:  Earlier same day CTA abdomen and pelvis and ultrasound abdomen FINDINGS: Lower chest: No acute findings. Hepatobiliary: Steatosis. Heterogeneous arterial parenchymal enhancement. Mild intrahepatic bile duct dilation. Common bile duct measures 9 mm. Dependent filling defects within the downstream common bile duct measuring up to 6 mm (13: 30, 31). Cholecystectomy. Pancreas: No mass, inflammatory changes, or other parenchymal abnormality identified. Spleen:  Within normal limits in size and appearance. Adrenals/Urinary Tract: No adrenal nodules. No suspicious renal masses identified. No evidence of hydronephrosis. Stomach/Bowel: Visualized portions within the abdomen are unremarkable. Normal appendix. Vascular/Lymphatic: Prominent periportal lymph nodes measuring up to 14 mm (4:25). No abdominal aortic aneurysm demonstrated. Other:  None. Musculoskeletal: No suspicious bone lesions identified. IMPRESSION: 1. Mild intrahepatic and extrahepatic bile duct dilation with dependent filling defects within the downstream common bile duct measuring up to 6 mm, suspicious  for choledocholithiasis. 2. Hepatic steatosis. 3. Prominent periportal lymph nodes measuring up to 14 mm, likely reactive. Electronically Signed   By: Limin  Xu M.D.   On: 04/02/2024 17:15   US  Abdomen Limited Result Date: 04/02/2024 CLINICAL DATA:  Right upper quadrant pain.  Prior cholecystectomy. EXAM: ULTRASOUND ABDOMEN LIMITED RIGHT UPPER QUADRANT COMPARISON:  02/22/2020 FINDINGS: Gallbladder: Surgically absent. Common bile duct: Diameter: 9 mm, which is mildly dilated. Liver: Diffusely increased parenchymal echogenicity again seen, consistent with steatosis. No hepatic masses identified.  Portal vein is patent on color Doppler imaging with normal direction of blood flow towards the liver. Other: None. IMPRESSION: Prior cholecystectomy. Mild dilatation of common bile duct measuring 9 mm. Suggest correlation with liver function tests, and consider abdomen MRI and MRCP without and with contrast if clinically warranted. Hepatic steatosis. Electronically Signed   By: Norleen DELENA Kil M.D.   On: 04/02/2024 08:32   CT Angio Chest/Abd/Pel for Dissection W and/or Wo Contrast Result Date: 04/02/2024 EXAM: CTA CHEST, ABDOMEN AND PELVIS WITH AND WITHOUT CONTRAST 04/02/2024 05:45:45 AM TECHNIQUE: CTA of the chest was performed with and without the administration of intravenous contrast. CTA of the abdomen and pelvis was performed with the administration of intravenous contrast. Multiplanar reformatted images are provided for review. MIP images are provided for review. Automated exposure control, iterative reconstruction, and/or weight based adjustment of the mA/kV was utilized to reduce the radiation dose to as low as reasonably achievable. COMPARISON: PA and lateral chest 12/29/2020, and partial chest CT with contrast for heart morphology and coronary CTA dated 05/12/2020. CLINICAL HISTORY: Sternal chest pain radiating posteriorly 2 episodes beginning yesterday evening and recurring this morning. Aortic aneurysm or dissection suspected. FINDINGS: VASCULATURE: AORTA: The thoracic aorta is normal in course and caliber. There is no aneurysm, stenosis, or dissection. There is trace calcific plaque in the proximal descending aorta, but no other thoracic aortic calcification. There are no calcifications in the abdominal aorta. No abdominal aortic aneurysm. PULMONARY ARTERIES: The pulmonary trunk measures prominent, 3.3 cm suggesting arterial hypertension, was previously 2.6 cm. No arterial embolism is seen to the segmental level. GREAT VESSELS OF AORTIC ARCH: No acute finding. No dissection. No arterial occlusion or  significant stenosis. CELIAC TRUNK: No acute finding. No occlusion or significant stenosis. SUPERIOR MESENTERIC ARTERY: No acute finding. No occlusion or significant stenosis. INFERIOR MESENTERIC ARTERY: No acute finding. No occlusion or significant stenosis. RENAL ARTERIES: No acute finding. No occlusion or significant stenosis. ILIAC ARTERIES: No acute finding. No occlusion or significant stenosis. CHEST: MEDIASTINUM: No mediastinal lymphadenopathy. The heart and pericardium demonstrate no acute abnormality. LUNGS AND PLEURA: There is coarse chronic linear scarring in the lingular base and a small amount also chronically in the medial right middle lobe. Additional linear scarring anteriorly in the right upper lobe. There are 2 chronic fissural nodules each measuring 6 mm in the right mid lung on axial 63 of series 7, stable, most likely intrapulmonary lymph nodes. No follow-up imaging is recommended. There is mild chronic elevation of the right hemidiaphragm. There are no further nodules. There is no consolidation, effusion, or pneumothorax. THORACIC BONES AND SOFT TISSUES: No acute bone or soft tissue abnormality. ABDOMEN AND PELVIS: LIVER: The liver is 18 cm in length, moderately steatotic with a cirrhotic configuration and capsular nodularity over portions consistent with cirrhosis. There is a prominent hepatic portal main vein measuring 16.3 mm. There is no mass enhancement throughout the liver. GALLBLADDER AND BILE DUCTS: The patient is status post cholecystectomy. There is a prominent  common bile duct measuring 12 mm without a visible filling defect. This is probably physiologic related to the cholecystectomy. There is mild intrahepatic bile duct dilatation. Pneumobilia is also noted, most likely due to prior sphincterotomy, occasionally may be seen with infection. SPLEEN: The spleen is mildly prominent, measuring 13.3 cm, without focal mass. PANCREAS: The pancreas is unremarkable. ADRENAL GLANDS: Bilateral  adrenal glands demonstrate no acute abnormality. KIDNEYS, URETERS AND BLADDER: No stones in the kidneys or ureters. No hydronephrosis. No perinephric or periureteral stranding. Urinary bladder is unremarkable. GI AND BOWEL: Stomach and duodenal sweep demonstrate no acute abnormality. There is no bowel obstruction. No abnormal bowel wall thickening or distension. There is colonic diverticulosis with no evidence of colitis or diverticulitis. The appendix is normal. REPRODUCTIVE: Uterus is absent with no adnexal mass. PERITONEUM AND RETROPERITONEUM: No ascites or free air. LYMPH NODES: No lymphadenopathy. ABDOMINAL BONES AND SOFT TISSUES: There are L5 pars defects with chronic appearance, chronic L5-S1 disc collapse, and a 9 mm grade 1 L5-S1 spondylolisthesis with severe secondary foraminal stenosis. No acute or other significant osseous findings. There is umbilical level rectal diastasis and a small umbilical hernia containing fat. There is no incarcerated hernia. No acute soft tissue abnormality. IMPRESSION: 1. No evidence of aortic aneurysm or dissection. 2. Prominent pulmonary trunk measuring 3.3 cm, increased from 2.6 cm, suggesting pulmonary arterial hypertension. No pulmonary embolism to the segmental level. 3. Moderately steatotic liver with cirrhotic configuration and capsular nodularity, slight splenomegaly and prominence in the hepatic portal vein . No hepatic mass enhancement. Mild intrahepatic bile duct dilatation and pneumobilia, likely post-sphincterotomy. 4. Colonic diverticulosis without evidence of colitis or diverticulitis. 5. 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 Electronically signed by: Francis Quam MD 04/02/2024 06:26 AM EDT RP Workstation: [id number]     Labs:   Basic Metabolic Panel: Recent Labs  Lab 04/02/24 0416 04/02/24 0732 04/03/24 0928 04/04/24 0645  NA 136  --  137 139   K 2.9*  --  2.5* 3.2*  CL 94*  --  97* 100  CO2 27  --  30 28  GLUCOSE 155*  --  125* 112*  BUN 11  --  7 10  CREATININE 0.92  --  0.76 0.77  CALCIUM 8.8*  --  8.2* 8.6*  MG  --  1.8  --   --    GFR Estimated Creatinine Clearance: 108.7 mL/min (by C-G formula based on SCr of 0.77 mg/dL). Liver Function Tests: Recent Labs  Lab 04/02/24 0416 04/02/24 1126 04/03/24 0928 04/04/24 0645  AST 328*  --  135* 102*  ALT 266*  --  209* 184*  ALKPHOS 126  --  105 118  BILITOT 2.5* 4.2* 4.1* 2.1*  PROT 6.4*  --  5.8* 6.1*  ALBUMIN 3.5  --  2.9* 3.0*   Recent Labs  Lab 04/02/24 0416  LIPASE 27   No results for input(s): AMMONIA in the last 168 hours. Coagulation profile Recent Labs  Lab 04/02/24 1126  INR 1.1    CBC: Recent Labs  Lab 04/02/24 0416 04/03/24 0928 04/04/24 0645  WBC 10.8* 6.4 4.9  NEUTROABS 9.6* 4.4  --   HGB 14.4 12.9 13.6  HCT 42.4 38.2 39.9  MCV 87.1 87.8 87.5  PLT 163 144* 154   Cardiac Enzymes: No results for input(s): CKTOTAL, CKMB, CKMBINDEX, TROPONINI in the last 168 hours. BNP: Invalid input(s): POCBNP CBG: Recent Labs  Lab 04/04/24 0855  GLUCAP 132*   D-Dimer No results for input(s): DDIMER in the last 72 hours. Hgb A1c No results for input(s): HGBA1C in the last 72 hours. Lipid Profile No results for input(s): CHOL, HDL, LDLCALC, TRIG, CHOLHDL, LDLDIRECT in the last 72 hours. Thyroid  function studies No results for input(s): TSH, T4TOTAL, T3FREE, THYROIDAB in the last 72 hours.  Invalid input(s): FREET3 Anemia work up No results for input(s): VITAMINB12, FOLATE, FERRITIN, TIBC, IRON, RETICCTPCT in the last 72 hours. Microbiology No results found for this or any previous visit (from the past 240 hours).   Discharge Instructions:   Discharge Instructions     Diet general   Complete by: As directed    Increase activity slowly   Complete by: As directed       Allergies as  of 04/04/2024   No Known Allergies      Medication List     PAUSE taking these medications    hydrochlorothiazide  50 MG tablet Wait to take this until your doctor or other care provider tells you to start again. Commonly known as: HYDRODIURIL  Take 1 tablet by mouth once daily       TAKE these medications    acetaminophen  500 MG tablet Commonly known as: TYLENOL  Take 1,000 mg by mouth every 6 (six) hours as needed for moderate pain (pain score 4-6).   albuterol  108 (90 Base) MCG/ACT inhaler Commonly known as: VENTOLIN  HFA Inhale 1-2 puffs into the lungs every 6 (six) hours as needed.   famotidine  20 MG tablet Commonly known as: PEPCID  Take 1 tablet (20 mg total) by mouth at bedtime. What changed: when to take this   ibuprofen 200 MG tablet Commonly known as: ADVIL Take 400 mg by mouth every 6 (six) hours as needed for moderate pain (pain score 4-6).   levocetirizine 5 MG tablet Commonly known as: XYZAL  Take 1 tablet (5 mg total) by mouth every morning.   montelukast  10 MG tablet Commonly known as: SINGULAIR  Take 1 tablet (10 mg total) by mouth as needed. What changed: when to take this   venlafaxine  XR 150 MG 24 hr capsule Commonly known as: EFFEXOR -XR Take 1 capsule (150 mg total) by mouth daily with breakfast.   Vitamin D  (Ergocalciferol ) 1.25 MG (50000 UNIT) Caps capsule Commonly known as: DRISDOL  Take 1 capsule (50,000 Units total) by mouth once a week.          Time coordinating discharge: 45 min  Signed:  Harlene RAYMOND Bowl DO  [facility] 04/04/2024, 1:03 PM

## 2024-04-04 NOTE — Anesthesia Procedure Notes (Signed)
 Procedure Name: Intubation Date/Time: 04/04/2024 8:18 AM  Performed by: Evette Ade, CRNAPre-anesthesia Checklist: Patient identified, Emergency Drugs available, Suction available, Patient being monitored and Timeout performed Patient Re-evaluated:Patient Re-evaluated prior to induction Oxygen Delivery Method: Circle system utilized Preoxygenation: Pre-oxygenation with 100% oxygen Induction Type: IV induction Ventilation: Mask ventilation without difficulty and Oral airway inserted - appropriate to patient size Laryngoscope Size: Mac and 4 Grade View: Grade III Tube type: Oral Tube size: 7.0 mm Number of attempts: 1 Airway Equipment and Method: Stylet Placement Confirmation: ETT inserted through vocal cords under direct vision Secured at: 21 cm Tube secured with: Tape Dental Injury: Teeth and Oropharynx as per pre-operative assessment

## 2024-04-04 NOTE — Anesthesia Postprocedure Evaluation (Signed)
 Anesthesia Post Note  Patient: Jody Jensen  Procedure(s) Performed: ERCP, WITH INTERVENTION IF INDICATED     Patient location during evaluation: PACU Anesthesia Type: General Level of consciousness: awake and alert, oriented and patient cooperative Pain management: pain level controlled Vital Signs Assessment: post-procedure vital signs reviewed and stable Respiratory status: spontaneous breathing, nonlabored ventilation and respiratory function stable Cardiovascular status: blood pressure returned to baseline and stable Postop Assessment: no apparent nausea or vomiting Anesthetic complications: no   No notable events documented.  Last Vitals:  Vitals:   04/04/24 0915 04/04/24 0923  BP: 129/75 127/80  Pulse: 67 69  Resp: 15 13  Temp:  36.6 C  SpO2: 94% 95%    Last Pain:  Vitals:   04/04/24 0923  TempSrc:   PainSc: 0-No pain                 Besnik Febus,E. Logyn Dedominicis

## 2024-04-04 NOTE — Plan of Care (Signed)
°  Problem: Education: Goal: Knowledge of General Education information will improve Description: Including pain rating scale, medication(s)/side effects and non-pharmacologic comfort measures Outcome: Progressing   Problem: Clinical Measurements: Goal: Will remain free from infection Outcome: Progressing Goal: Diagnostic test results will improve Outcome: Progressing   Problem: Nutrition: Goal: Adequate nutrition will be maintained Outcome: Progressing   Problem: Safety: Goal: Ability to remain free from injury will improve Outcome: Progressing   Problem: Skin Integrity: Goal: Risk for impaired skin integrity will decrease Outcome: Progressing

## 2024-04-09 ENCOUNTER — Ambulatory Visit: Payer: Self-pay

## 2024-04-16 ENCOUNTER — Ambulatory Visit: Admitting: Family

## 2024-04-19 ENCOUNTER — Ambulatory Visit: Admitting: Cardiology

## 2024-04-19 VITALS — BP 126/88 | HR 75 | Ht 64.0 in | Wt 281.0 lb

## 2024-04-19 DIAGNOSIS — I10 Essential (primary) hypertension: Secondary | ICD-10-CM

## 2024-04-19 DIAGNOSIS — R7303 Prediabetes: Secondary | ICD-10-CM

## 2024-04-19 DIAGNOSIS — E66813 Obesity, class 3: Secondary | ICD-10-CM | POA: Diagnosis not present

## 2024-04-19 DIAGNOSIS — I272 Pulmonary hypertension, unspecified: Secondary | ICD-10-CM | POA: Insufficient documentation

## 2024-04-19 DIAGNOSIS — E782 Mixed hyperlipidemia: Secondary | ICD-10-CM | POA: Diagnosis not present

## 2024-04-19 DIAGNOSIS — Z6841 Body Mass Index (BMI) 40.0 and over, adult: Secondary | ICD-10-CM | POA: Insufficient documentation

## 2024-04-19 DIAGNOSIS — K8050 Calculus of bile duct without cholangitis or cholecystitis without obstruction: Secondary | ICD-10-CM

## 2024-04-19 DIAGNOSIS — K760 Fatty (change of) liver, not elsewhere classified: Secondary | ICD-10-CM

## 2024-04-19 NOTE — Progress Notes (Unsigned)
 Established Patient Office Visit  Subjective:  Patient ID: Jody Jensen, female    DOB: 1970-09-02  Age: 53 y.o. MRN: [id number]  Chief Complaint  Patient presents with   Follow-up    Hospital follow up    Patient in office for hospital follow up. Patient reports feeling better. Patient went to ED on 04/02/24 with complaints of chest pain. Patient was found to have choledocholithiasis, ERCP performed. CT scan showed no evidence of aortic aneurysm or dissection, but moderately steatotic liver with cirrhotic configuration and prominent pulmonary trunk measuring 3.3 cm, increased from 2.6 cm, suggesting pulmonary arterial hypertension without pulmonary embolism to the segmental level.  Potassium low on arrival to ED. Hydrochlorothiazide  held until outpatient follow up. Will recheck today.  Will refer to cardiology for pulmonary arterial hypertension work up.  Blood pressure ok today. Will restart hydrochlorothiazide  pending results of blood work.  Continue current medications.     No other concerns at this time.   Past Medical History:  Diagnosis Date   Allergy    As a child   Anxiety    Arthritis    In knees   Asthma    Diabetes mellitus without complication (HCC)    Hyperlipidemia    Hypertension    Obesity    OSA (obstructive sleep apnea)    Ovarian cyst    Sleep apnea     Past Surgical History:  Procedure Laterality Date   ABDOMINAL HYSTERECTOMY      partial, ovaries remain, no cervix   bone spur Right    BREAST EXCISIONAL BIOPSY Left    BREAST SURGERY     benign breast cyst removal   CESAREAN SECTION     CHOLECYSTECTOMY     ERCP N/A 04/04/2024   Procedure: ERCP, WITH INTERVENTION IF INDICATED;  Surgeon: Saintclair Jasper, MD;  Location: [facility] ENDOSCOPY;  Service: Gastroenterology;  Laterality: N/A;   NASAL SINUS SURGERY     ovarian cyst removal     TUBAL LIGATION      Social History   Socioeconomic History   Marital status: Married    Spouse name: Not on file    Number of children: 2   Years of education: Not on file   Highest education level: Associate degree: occupational, scientist, product/process development, or vocational program  Occupational History   Not on file  Tobacco Use   Smoking status: Former    Current packs/day: 0.00    Average packs/day: 2.0 packs/day for 12.0 years (24.0 ttl pk-yrs)    Types: Cigarettes, E-cigarettes    Start date: 02/03/2000    Quit date: 02/03/2012    Years since quitting: 12.2   Smokeless tobacco: Never  Vaping Use   Vaping status: Former   Devices: quit in july 2022  Substance and Sexual Activity   Alcohol use: Yes    Comment: rare   Drug use: No   Sexual activity: Yes    Birth control/protection: Surgical  Other Topics Concern   Not on file  Social History Narrative   Not on file   Social Drivers of Health   Financial Resource Strain: Low Risk  (07/10/2023)   Overall Financial Resource Strain (CARDIA)    Difficulty of Paying Living Expenses: Not hard at all  Food Insecurity: No Food Insecurity (04/02/2024)   Hunger Vital Sign    Worried About Running Out of Food in the Last Year: Never true    Ran Out of Food in the Last Year: Never true  Transportation Needs: No  Transportation Needs (04/02/2024)   PRAPARE - Administrator, Civil Service (Medical): No    Lack of Transportation (Non-Medical): No  Physical Activity: Insufficiently Active (07/10/2023)   Exercise Vital Sign    Days of Exercise per Week: 2 days    Minutes of Exercise per Session: 20 min  Stress: No Stress Concern Present (07/10/2023)   [employer] of Occupational Health - Occupational Stress Questionnaire    Feeling of Stress : Only a little  Social Connections: Socially Integrated (07/10/2023)   Social Connection and Isolation Panel    Frequency of Communication with Friends and Family: Three times a week    Frequency of Social Gatherings with Friends and Family: Once a week    Attends Religious Services: More than 4 times per year    Active  Member of [employer] or Organizations: No    Attends Engineer, Structural: More than 4 times per year    Marital Status: Married  Catering Manager Violence: Not At Risk (04/02/2024)   Humiliation, Afraid, Rape, and Kick questionnaire    Fear of Current or Ex-Partner: No    Emotionally Abused: No    Physically Abused: No    Sexually Abused: No    Family History  Problem Relation Age of Onset   Hypertension Mother    Hyperlipidemia Mother    Heart disease Mother    Diabetes Mother    Asthma Mother    Thyroid  disease Mother    Lupus Mother    Immunodeficiency Mother    Arthritis Mother    Heart disease Father    Hypertension Father    Cervical cancer Paternal Grandmother        cervical   Leukemia Paternal Grandmother    Diabetes Paternal Grandfather    Stroke Paternal Grandfather    Diabetes Paternal Aunt    Prostate cancer Paternal Uncle    Heart disease Paternal Uncle     No Known Allergies  Outpatient Medications Prior to Visit  Medication Sig   acetaminophen  (TYLENOL ) 500 MG tablet Take 1,000 mg by mouth every 6 (six) hours as needed for moderate pain (pain score 4-6).   albuterol  (VENTOLIN  HFA) 108 (90 Base) MCG/ACT inhaler Inhale 1-2 puffs into the lungs every 6 (six) hours as needed.   famotidine  (PEPCID ) 20 MG tablet Take 1 tablet (20 mg total) by mouth at bedtime. (Patient taking differently: Take 20 mg by mouth daily.)   ibuprofen (ADVIL) 200 MG tablet Take 400 mg by mouth every 6 (six) hours as needed for moderate pain (pain score 4-6).   levocetirizine (XYZAL ) 5 MG tablet Take 1 tablet (5 mg total) by mouth every morning.   montelukast  (SINGULAIR ) 10 MG tablet Take 1 tablet (10 mg total) by mouth as needed. (Patient taking differently: Take 10 mg by mouth in the morning.)   venlafaxine  XR (EFFEXOR -XR) 150 MG 24 hr capsule Take 1 capsule (150 mg total) by mouth daily with breakfast.   Vitamin D , Ergocalciferol , (DRISDOL ) 1.25 MG (50000 UNIT) CAPS capsule Take  1 capsule (50,000 Units total) by mouth once a week.   [Paused] hydrochlorothiazide  (HYDRODIURIL ) 50 MG tablet Take 1 tablet by mouth once daily   No facility-administered medications prior to visit.    Review of Systems  Constitutional: Negative.   HENT: Negative.    Eyes: Negative.   Respiratory: Negative.  Negative for shortness of breath.   Cardiovascular: Negative.  Negative for chest pain.  Gastrointestinal: Negative.  Negative for abdominal pain,  constipation and diarrhea.  Genitourinary: Negative.   Musculoskeletal:  Negative for joint pain and myalgias.  Skin: Negative.   Neurological: Negative.  Negative for dizziness and headaches.  Endo/Heme/Allergies: Negative.   All other systems reviewed and are negative.      Objective:   BP 126/88   Pulse 75   Ht 5' 4 (1.626 m)   Wt 281 lb (127.5 kg)   LMP 11/02/1997   SpO2 98%   BMI 48.23 kg/m   Vitals:   04/19/24 1336  BP: 126/88  Pulse: 75  Height: 5' 4 (1.626 m)  Weight: 281 lb (127.5 kg)  SpO2: 98%  BMI (Calculated): 48.21    Physical Exam Vitals and nursing note reviewed.  Constitutional:      Appearance: Normal appearance. She is normal weight.  HENT:     Head: Normocephalic and atraumatic.     Nose: Nose normal.     Mouth/Throat:     Mouth: Mucous membranes are moist.  Eyes:     Extraocular Movements: Extraocular movements intact.     Conjunctiva/sclera: Conjunctivae normal.     Pupils: Pupils are equal, round, and reactive to light.  Cardiovascular:     Rate and Rhythm: Normal rate and regular rhythm.     Pulses: Normal pulses.     Heart sounds: Normal heart sounds.  Pulmonary:     Effort: Pulmonary effort is normal.     Breath sounds: Normal breath sounds.  Abdominal:     General: Abdomen is flat. Bowel sounds are normal.     Palpations: Abdomen is soft.  Musculoskeletal:        General: Normal range of motion.     Cervical back: Normal range of motion.  Skin:    General: Skin is warm  and dry.  Neurological:     General: No focal deficit present.     Mental Status: She is alert and oriented to person, place, and time.  Psychiatric:        Mood and Affect: Mood normal.        Behavior: Behavior normal.        Thought Content: Thought content normal.        Judgment: Judgment normal.      Results for orders placed or performed in visit on 04/19/24  CMP14+EGFR  Result Value Ref Range   Glucose 93 70 - 99 mg/dL   BUN 13 6 - 24 mg/dL   Creatinine, Ser 9.30 0.57 - 1.00 mg/dL   eGFR 895 >40 fO/fpw/8.26   BUN/Creatinine Ratio 19 9 - 23   Sodium 141 134 - 144 mmol/L   Potassium 3.9 3.5 - 5.2 mmol/L   Chloride 101 96 - 106 mmol/L   CO2 26 20 - 29 mmol/L   Calcium 9.6 8.7 - 10.2 mg/dL   Total Protein 6.3 6.0 - 8.5 g/dL   Albumin 4.2 3.8 - 4.9 g/dL   Globulin, Total 2.1 1.5 - 4.5 g/dL   Bilirubin Total 0.5 0.0 - 1.2 mg/dL   Alkaline Phosphatase 143 (H) 49 - 135 IU/L   AST 56 (H) 0 - 40 IU/L   ALT 68 (H) 0 - 32 IU/L    Recent Results (from the past 2160 hours)  CMP14+EGFR     Status: Abnormal   Collection Time: 03/17/24  3:25 PM  Result Value Ref Range   Glucose 90 70 - 99 mg/dL   BUN 14 6 - 24 mg/dL   Creatinine, Ser 9.27 0.57 - 1.00  mg/dL   eGFR 898 >40 fO/fpw/8.26   BUN/Creatinine Ratio 19 9 - 23   Sodium 142 134 - 144 mmol/L   Potassium 3.1 (L) 3.5 - 5.2 mmol/L   Chloride 98 96 - 106 mmol/L   CO2 32 (H) 20 - 29 mmol/L   Calcium 9.6 8.7 - 10.2 mg/dL   Total Protein 6.1 6.0 - 8.5 g/dL   Albumin 4.1 3.8 - 4.9 g/dL   Globulin, Total 2.0 1.5 - 4.5 g/dL   Bilirubin Total 0.3 0.0 - 1.2 mg/dL   Alkaline Phosphatase 124 49 - 135 IU/L   AST 52 (H) 0 - 40 IU/L   ALT 71 (H) 0 - 32 IU/L  Lipid panel     Status: Abnormal   Collection Time: 03/17/24  3:25 PM  Result Value Ref Range   Cholesterol, Total 203 (H) 100 - 199 mg/dL   Triglycerides 856 0 - 149 mg/dL   HDL 59 >60 mg/dL   VLDL Cholesterol Cal 25 5 - 40 mg/dL   LDL Chol Calc (NIH) 880 (H) 0 - 99 mg/dL    Chol/HDL Ratio 3.4 0.0 - 4.4 ratio    Comment:                                   T. Chol/HDL Ratio                                             Men  Women                               1/2 Avg.Risk  3.4    3.3                                   Avg.Risk  5.0    4.4                                2X Avg.Risk  9.6    7.1                                3X Avg.Risk 23.4   11.0   VITAMIN D  25 Hydroxy (Vit-D Deficiency, Fractures)     Status: Abnormal   Collection Time: 03/17/24  3:25 PM  Result Value Ref Range   Vit D, 25-Hydroxy 27.3 (L) 30.0 - 100.0 ng/mL    Comment: Vitamin D  deficiency has been defined by the Institute of Medicine and an Endocrine Society practice guideline as a level of serum 25-OH vitamin D  less than 20 ng/mL (1,2). The Endocrine Society went on to further define vitamin D  insufficiency as a level between 21 and 29 ng/mL (2). 1. IOM (Institute of Medicine). 2010. Dietary reference    intakes for calcium and D. [place]: The    [employer]. 2. Holick MF, Binkley [place], Bischoff-Ferrari HA, et al.    Evaluation, treatment, and prevention of vitamin D     deficiency: an Endocrine Society clinical practice    guideline. JCEM. 2011 Jul; 96(7):1911-30.   Vitamin B12  Status: None   Collection Time: 03/17/24  3:25 PM  Result Value Ref Range   Vitamin B-12 475 232 - 1,245 pg/mL  CBC with Diff     Status: Abnormal   Collection Time: 03/17/24  3:25 PM  Result Value Ref Range   WBC 6.4 3.4 - 10.8 x10E3/uL   RBC 4.98 3.77 - 5.28 x10E6/uL   Hemoglobin 14.1 11.1 - 15.9 g/dL   Hematocrit 54.8 65.9 - 46.6 %   MCV 91 79 - 97 fL   MCH 28.3 26.6 - 33.0 pg   MCHC 31.3 (L) 31.5 - 35.7 g/dL   RDW 85.9 88.2 - 84.5 %   Platelets 210 150 - 450 x10E3/uL   Neutrophils 49 Not Estab. %   Lymphs 39 Not Estab. %   Monocytes 8 Not Estab. %   Eos 3 Not Estab. %   Basos 1 Not Estab. %   Neutrophils Absolute 3.2 1.4 - 7.0 x10E3/uL   Lymphocytes Absolute 2.5 0.7 - 3.1  x10E3/uL   Monocytes Absolute 0.5 0.1 - 0.9 x10E3/uL   EOS (ABSOLUTE) 0.2 0.0 - 0.4 x10E3/uL   Basophils Absolute 0.0 0.0 - 0.2 x10E3/uL   Immature Granulocytes 0 Not Estab. %   Immature Grans (Abs) 0.0 0.0 - 0.1 x10E3/uL  Hemoglobin A1c     Status: None   Collection Time: 03/17/24  3:25 PM  Result Value Ref Range   Hgb A1c MFr Bld 5.5 4.8 - 5.6 %    Comment:          Prediabetes: 5.7 - 6.4          Diabetes: >6.4          Glycemic control for adults with diabetes: <7.0    Est. average glucose Bld gHb Est-mCnc 111 mg/dL  Sed Rate (ESR)     Status: None   Collection Time: 03/17/24  3:25 PM  Result Value Ref Range   Sed Rate 16 0 - 40 mm/hr  Comprehensive metabolic panel     Status: Abnormal   Collection Time: 04/02/24  4:16 AM  Result Value Ref Range   Sodium 136 135 - 145 mmol/L   Potassium 2.9 (L) 3.5 - 5.1 mmol/L   Chloride 94 (L) 98 - 111 mmol/L   CO2 27 22 - 32 mmol/L   Glucose, Bld 155 (H) 70 - 99 mg/dL    Comment: Glucose reference range applies only to samples taken after fasting for at least 8 hours.   BUN 11 6 - 20 mg/dL   Creatinine, Ser 9.07 0.44 - 1.00 mg/dL   Calcium 8.8 (L) 8.9 - 10.3 mg/dL   Total Protein 6.4 (L) 6.5 - 8.1 g/dL   Albumin 3.5 3.5 - 5.0 g/dL   AST 671 (H) 15 - 41 U/L   ALT 266 (H) 0 - 44 U/L   Alkaline Phosphatase 126 38 - 126 U/L   Total Bilirubin 2.5 (H) 0.0 - 1.2 mg/dL   GFR, Estimated >39 >39 mL/min    Comment: (NOTE) Calculated using the CKD-EPI Creatinine Equation (2021)    Anion gap 15 5 - 15    Comment: Performed at [facility], [address], [place], [place] [address]  Lipase, blood     Status: None   Collection Time: 04/02/24  4:16 AM  Result Value Ref Range   Lipase 27 11 - 51 U/L    Comment: Performed at [facility], [address] [place], [place] [address]  CBC  with Differential     Status: Abnormal   Collection Time: 04/02/24  4:16 AM  Result Value Ref Range   WBC 10.8 (H) 4.0 - 10.5 K/uL   RBC 4.87 3.87  - 5.11 MIL/uL   Hemoglobin 14.4 12.0 - 15.0 g/dL   HCT 57.5 63.9 - 53.9 %   MCV 87.1 80.0 - 100.0 fL   MCH 29.6 26.0 - 34.0 pg   MCHC 34.0 30.0 - 36.0 g/dL   RDW 85.4 88.4 - 84.4 %   Platelets 163 150 - 400 K/uL   nRBC 0.0 0.0 - 0.2 %   Neutrophils Relative % 90 %   Neutro Abs 9.6 (H) 1.7 - 7.7 K/uL   Lymphocytes Relative 5 %   Lymphs Abs 0.5 (L) 0.7 - 4.0 K/uL   Monocytes Relative 5 %   Monocytes Absolute 0.5 0.1 - 1.0 K/uL   Eosinophils Relative 0 %   Eosinophils Absolute 0.0 0.0 - 0.5 K/uL   Basophils Relative 0 %   Basophils Absolute 0.0 0.0 - 0.1 K/uL   Immature Granulocytes 0 %   Abs Immature Granulocytes 0.04 0.00 - 0.07 K/uL    Comment: Performed at [facility], [address] [place], [place] [address]  Troponin I (High Sensitivity)     Status: None   Collection Time: 04/02/24  4:16 AM  Result Value Ref Range   Troponin I (High Sensitivity) 9 <18 ng/L    Comment: (NOTE) Elevated high sensitivity troponin I (hsTnI) values and significant  changes across serial measurements may suggest ACS but many other  chronic and acute conditions are known to elevate hsTnI results.  Refer to the Links section for chest pain algorithms and additional  guidance. Performed at [facility], [address], [place], [place] [address]   Magnesium     Status: None   Collection Time: 04/02/24  7:32 AM  Result Value Ref Range   Magnesium 1.8 1.7 - 2.4 mg/dL    Comment: Performed at [facility], [address], [place], [place] [address]  Troponin I (High Sensitivity)     Status: None   Collection Time: 04/02/24  7:32 AM  Result Value Ref Range   Troponin I (High Sensitivity) 7 <18 ng/L    Comment: (NOTE) Elevated high sensitivity troponin I (hsTnI) values and significant  changes across serial measurements may suggest ACS but many other  chronic and acute conditions are known to elevate hsTnI results.  Refer to the Links section for chest pain algorithms and  additional  guidance. Performed at [facility], [address], [place], [place] [address]   HIV Antibody (routine testing w rflx)     Status: None   Collection Time: 04/02/24 11:26 AM  Result Value Ref Range   HIV Screen 4th Generation wRfx Non Reactive Non Reactive    Comment: Performed at [facility], [address] [place], [place] [address]  Bilirubin, fractionated(tot/dir/indir)     Status: Abnormal   Collection Time: 04/02/24 11:26 AM  Result Value Ref Range   Total Bilirubin 4.2 (H) 0.0 - 1.2 mg/dL   Bilirubin, Direct 2.5 (H) 0.0 - 0.2 mg/dL   Indirect Bilirubin 1.7 (H) 0.3 - 0.9 mg/dL    Comment: Performed at [facility], [address] [place], [place] [address]  Protime-INR     Status: None   Collection Time: 04/02/24 11:26 AM  Result Value Ref Range   Prothrombin Time  14.9 11.4 - 15.2 seconds   INR 1.1 0.8 - 1.2    Comment: (NOTE) INR goal varies based on device and disease states. Performed at [facility], [address], [place], [place] [address]   Hepatitis A antibody, total     Status: Abnormal   Collection Time: 04/02/24 11:26 AM  Result Value Ref Range   hep A Total Ab Reactive (A) NON REACTIVE    Comment: Performed at [facility], [address] [place], [place] [address]  Hepatitis B surface antigen     Status: None   Collection Time: 04/02/24 11:26 AM  Result Value Ref Range   Hepatitis B Surface Ag NON REACTIVE NON REACTIVE    Comment: Performed at [facility], [address] [place], [place] [address]  Hepatitis B surface antibody,qualitative     Status: None   Collection Time: 04/02/24 11:26 AM  Result Value Ref Range   Hep B S Ab NON REACTIVE NON REACTIVE    Comment: (NOTE) Inconsistent with immunity, less than 10 mIU/mL.  Performed at [facility], [address], [place], [place] [address]   Hepatitis C antibody     Status: None   Collection Time: 04/02/24 11:26 AM  Result Value Ref Range    HCV Ab NON REACTIVE NON REACTIVE    Comment: (NOTE) Nonreactive HCV antibody screen is consistent with no HCV infections,  unless recent infection is suspected or other evidence exists to indicate HCV infection.  Performed at [facility], [address] [place], [place] [address]   CBC with Differential/Platelet     Status: Abnormal   Collection Time: 04/03/24  9:28 AM  Result Value Ref Range   WBC 6.4 4.0 - 10.5 K/uL   RBC 4.35 3.87 - 5.11 MIL/uL   Hemoglobin 12.9 12.0 - 15.0 g/dL   HCT 61.7 63.9 - 53.9 %   MCV 87.8 80.0 - 100.0 fL   MCH 29.7 26.0 - 34.0 pg   MCHC 33.8 30.0 - 36.0 g/dL   RDW 85.0 88.4 - 84.4 %   Platelets 144 (L) 150 - 400 K/uL   nRBC 0.0 0.0 - 0.2 %   Neutrophils Relative % 69 %   Neutro Abs 4.4 1.7 - 7.7 K/uL   Lymphocytes Relative 21 %   Lymphs Abs 1.4 0.7 - 4.0 K/uL   Monocytes Relative 7 %   Monocytes Absolute 0.5 0.1 - 1.0 K/uL   Eosinophils Relative 2 %   Eosinophils Absolute 0.1 0.0 - 0.5 K/uL   Basophils Relative 0 %   Basophils Absolute 0.0 0.0 - 0.1 K/uL   Immature Granulocytes 1 %   Abs Immature Granulocytes 0.03 0.00 - 0.07 K/uL    Comment: Performed at [facility], [address], [place], [place] [address]  Comprehensive metabolic panel     Status: Abnormal   Collection Time: 04/03/24  9:28 AM  Result Value Ref Range   Sodium 137 135 - 145 mmol/L   Potassium 2.5 (LL) 3.5 - 5.1 mmol/L    Comment: CRITICAL RESULT CALLED TO, READ BACK BY AND VERIFIED WITH M.CALDERON RN @1010  11.01.2025 E.AHMED   Chloride 97 (L) 98 - 111 mmol/L   CO2 30 22 - 32 mmol/L   Glucose, Bld 125 (H) 70 - 99 mg/dL    Comment: Glucose reference range applies only to samples taken after fasting for at least 8 hours.   BUN 7 6 - 20  mg/dL   Creatinine, Ser 9.23 0.44 - 1.00 mg/dL   Calcium 8.2 (L) 8.9 - 10.3 mg/dL   Total Protein 5.8 (L) 6.5 - 8.1 g/dL   Albumin 2.9 (L) 3.5 - 5.0 g/dL   AST 864 (H) 15 - 41 U/L   ALT 209 (H) 0 - 44 U/L   Alkaline  Phosphatase 105 38 - 126 U/L   Total Bilirubin 4.1 (H) 0.0 - 1.2 mg/dL   GFR, Estimated >39 >39 mL/min    Comment: (NOTE) Calculated using the CKD-EPI Creatinine Equation (2021)    Anion gap 10 5 - 15    Comment: Performed at [facility], [address], [place], [place] [address]  CBC     Status: None   Collection Time: 04/04/24  6:45 AM  Result Value Ref Range   WBC 4.9 4.0 - 10.5 K/uL   RBC 4.56 3.87 - 5.11 MIL/uL   Hemoglobin 13.6 12.0 - 15.0 g/dL   HCT 60.0 63.9 - 53.9 %   MCV 87.5 80.0 - 100.0 fL   MCH 29.8 26.0 - 34.0 pg   MCHC 34.1 30.0 - 36.0 g/dL   RDW 85.2 88.4 - 84.4 %   Platelets 154 150 - 400 K/uL   nRBC 0.0 0.0 - 0.2 %    Comment: Performed at [facility], [address], [place], [place] [address]  Comprehensive metabolic panel     Status: Abnormal   Collection Time: 04/04/24  6:45 AM  Result Value Ref Range   Sodium 139 135 - 145 mmol/L   Potassium 3.2 (L) 3.5 - 5.1 mmol/L   Chloride 100 98 - 111 mmol/L   CO2 28 22 - 32 mmol/L   Glucose, Bld 112 (H) 70 - 99 mg/dL    Comment: Glucose reference range applies only to samples taken after fasting for at least 8 hours.   BUN 10 6 - 20 mg/dL   Creatinine, Ser 9.22 0.44 - 1.00 mg/dL   Calcium 8.6 (L) 8.9 - 10.3 mg/dL   Total Protein 6.1 (L) 6.5 - 8.1 g/dL   Albumin 3.0 (L) 3.5 - 5.0 g/dL   AST 897 (H) 15 - 41 U/L   ALT 184 (H) 0 - 44 U/L   Alkaline Phosphatase 118 38 - 126 U/L   Total Bilirubin 2.1 (H) 0.0 - 1.2 mg/dL   GFR, Estimated >39 >39 mL/min    Comment: (NOTE) Calculated using the CKD-EPI Creatinine Equation (2021)    Anion gap 11 5 - 15    Comment: Performed at [facility], [address], [place], [place] [address]  Glucose, capillary     Status: Abnormal   Collection Time: 04/04/24  8:55 AM  Result Value Ref Range   Glucose-Capillary 132 (H) 70 - 99 mg/dL    Comment: Glucose reference range applies only to samples taken after fasting for at least 8 hours.  CMP14+EGFR      Status: Abnormal   Collection Time: 04/19/24  2:17 PM  Result Value Ref Range   Glucose 93 70 - 99 mg/dL   BUN 13 6 - 24 mg/dL   Creatinine, Ser 9.30 0.57 - 1.00 mg/dL   eGFR 895 >40 fO/fpw/8.26   BUN/Creatinine Ratio 19 9 - 23   Sodium 141 134 - 144 mmol/L   Potassium 3.9 3.5 - 5.2 mmol/L   Chloride 101 96 - 106 mmol/L   CO2 26 20 - 29 mmol/L   Calcium 9.6 8.7 -  10.2 mg/dL   Total Protein 6.3 6.0 - 8.5 g/dL   Albumin 4.2 3.8 - 4.9 g/dL   Globulin, Total 2.1 1.5 - 4.5 g/dL   Bilirubin Total 0.5 0.0 - 1.2 mg/dL   Alkaline Phosphatase 143 (H) 49 - 135 IU/L   AST 56 (H) 0 - 40 IU/L   ALT 68 (H) 0 - 32 IU/L      Assessment & Plan:  Referral sent to cardiology CMP today Will restart hydrochlorothiazide  pending lab results  Problem List Items Addressed This Visit       Cardiovascular and Mediastinum   HTN (hypertension) - Primary   Relevant Orders   CMP14+EGFR (Completed)   Pulmonary hypertension (HCC)   Relevant Orders   Ambulatory referral to Cardiology     Digestive   Fatty liver   Choledocholithiasis     Other   Prediabetes   Mixed hyperlipidemia   Class 3 severe obesity due to excess calories with serious comorbidity and body mass index (BMI) of 45.0 to 49.9 in adult [facility])    Return if symptoms worsen or fail to improve.   Total time spent: 25 minutes. This time includes review of previous notes and results and patient face to face interaction during today's visit.    Jeoffrey Pollen, NP  04/19/2024   This document may have been prepared by Dragon Voice Recognition software and as such may include unintentional dictation errors.

## 2024-04-20 ENCOUNTER — Ambulatory Visit: Payer: Self-pay | Admitting: Cardiology

## 2024-04-20 ENCOUNTER — Encounter: Payer: Self-pay | Admitting: Cardiology

## 2024-04-20 LAB — CMP14+EGFR
ALT: 68 IU/L — ABNORMAL HIGH (ref 0–32)
AST: 56 IU/L — ABNORMAL HIGH (ref 0–40)
Albumin: 4.2 g/dL (ref 3.8–4.9)
Alkaline Phosphatase: 143 IU/L — ABNORMAL HIGH (ref 49–135)
BUN/Creatinine Ratio: 19 (ref 9–23)
BUN: 13 mg/dL (ref 6–24)
Bilirubin Total: 0.5 mg/dL (ref 0.0–1.2)
CO2: 26 mmol/L (ref 20–29)
Calcium: 9.6 mg/dL (ref 8.7–10.2)
Chloride: 101 mmol/L (ref 96–106)
Creatinine, Ser: 0.69 mg/dL (ref 0.57–1.00)
Globulin, Total: 2.1 g/dL (ref 1.5–4.5)
Glucose: 93 mg/dL (ref 70–99)
Potassium: 3.9 mmol/L (ref 3.5–5.2)
Sodium: 141 mmol/L (ref 134–144)
Total Protein: 6.3 g/dL (ref 6.0–8.5)
eGFR: 104 mL/min/1.73 (ref >59)

## 2024-04-22 ENCOUNTER — Ambulatory Visit: Payer: Self-pay | Admitting: Orthopaedic Surgery

## 2024-05-18 NOTE — Progress Notes (Unsigned)
 Cardiology Office Note:    Date:  05/20/2024   ID:  Jody Jensen, DOB 1970/08/29, MRN [id number]  PCP:  Orlean Alan HERO, FNP   [facility] Providers Cardiologist:  None     Referring MD: Carin Gauze, NP   Chief Complaint  Patient presents with   pulmonary HTN    History of Present Illness:    Jody Jensen is a 53 y.o. female is seen at the request of Gauze Carin NP for evaluation of pulmonary HTN. She has a history of morbid obesity and OSA, DM, HTN, HLD. She was seen by me for evaluation in 2021 for chest pain. Coronary CTA was normal with no CAD and calcium score of 0. Recently admitted with abdominal pain. Was diagnosed with gallstones and had ERCP with sphincterotomy. Reports she had cholecystectomy at age 12. CT showed enlarged pulmonary veins suggesting possible pulmonary HTN. (Not mentioned on MRI).   She does note symptoms of SOB on exertion and also retains fluid. Was on HCT 50 mg a day but stopped in hospital with potassium of 2.5. states she started retaining fluid so resumed HCT at 25 mg daily.   Past Medical History:  Diagnosis Date   Allergy    As a child   Anxiety    Arthritis    In knees   Asthma    Diabetes mellitus without complication (HCC)    Hyperlipidemia    Hypertension    Obesity    OSA (obstructive sleep apnea)    Ovarian cyst    Sleep apnea     Past Surgical History:  Procedure Laterality Date   ABDOMINAL HYSTERECTOMY      partial, ovaries remain, no cervix   bone spur Right    BREAST EXCISIONAL BIOPSY Left    BREAST SURGERY     benign breast cyst removal   CESAREAN SECTION     CHOLECYSTECTOMY     ERCP N/A 04/04/2024   Procedure: ERCP, WITH INTERVENTION IF INDICATED;  Surgeon: Saintclair Jasper, MD;  Location: [facility] ENDOSCOPY;  Service: Gastroenterology;  Laterality: N/A;   NASAL SINUS SURGERY     ovarian cyst removal     TUBAL LIGATION      Current Medications: Active Medications[1]   Allergies:   Patient has no known  allergies.   Social History   Socioeconomic History   Marital status: Married    Spouse name: Not on file   Number of children: 2   Years of education: Not on file   Highest education level: Associate degree: occupational, scientist, product/process development, or vocational program  Occupational History   Not on file  Tobacco Use   Smoking status: Former    Current packs/day: 0.00    Average packs/day: 2.0 packs/day for 12.0 years (24.0 ttl pk-yrs)    Types: Cigarettes, E-cigarettes    Start date: 02/03/2000    Quit date: 02/03/2012    Years since quitting: 12.3   Smokeless tobacco: Never  Vaping Use   Vaping status: Former   Devices: quit in july 2022  Substance and Sexual Activity   Alcohol use: Yes    Comment: rare   Drug use: No   Sexual activity: Yes    Birth control/protection: Surgical  Other Topics Concern   Not on file  Social History Narrative   Not on file   Social Drivers of Health   Tobacco Use: Medium Risk (05/20/2024)   Patient History    Smoking Tobacco Use: Former    Smokeless Tobacco Use:  Never    Passive Exposure: Not on file  Financial Resource Strain: Low Risk (07/10/2023)   Overall Financial Resource Strain (CARDIA)    Difficulty of Paying Living Expenses: Not hard at all  Food Insecurity: No Food Insecurity (04/02/2024)   Epic    Worried About Programme Researcher, Broadcasting/film/video in the Last Year: Never true    Ran Out of Food in the Last Year: Never true  Transportation Needs: No Transportation Needs (04/02/2024)   Epic    Lack of Transportation (Medical): No    Lack of Transportation (Non-Medical): No  Physical Activity: Insufficiently Active (07/10/2023)   Exercise Vital Sign    Days of Exercise per Week: 2 days    Minutes of Exercise per Session: 20 min  Stress: No Stress Concern Present (07/10/2023)   [employer] of Occupational Health - Occupational Stress Questionnaire    Feeling of Stress : Only a little  Social Connections: Socially Integrated (07/10/2023)   Social  Connection and Isolation Panel    Frequency of Communication with Friends and Family: Three times a week    Frequency of Social Gatherings with Friends and Family: Once a week    Attends Religious Services: More than 4 times per year    Active Member of Clubs or Organizations: No    Attends Engineer, Structural: More than 4 times per year    Marital Status: Married  Depression (PHQ2-9): Low Risk (12/11/2023)   Depression (PHQ2-9)    PHQ-2 Score: 4  Alcohol Screen: Low Risk (07/10/2023)   Alcohol Screen    Last Alcohol Screening Score (AUDIT): 1  Housing: Low Risk (04/02/2024)   Epic    Unable to Pay for Housing in the Last Year: No    Number of Times Moved in the Last Year: 0    Homeless in the Last Year: No  Utilities: Not At Risk (04/02/2024)   Epic    Threatened with loss of utilities: No  Health Literacy: Not on file     Family History: The patient's family history includes Arthritis in her mother; Asthma in her mother; Cervical cancer in her paternal grandmother; Diabetes in her mother, paternal aunt, and paternal grandfather; Heart disease in her father, mother, and paternal uncle; Hyperlipidemia in her mother; Hypertension in her father and mother; Immunodeficiency in her mother; Leukemia in her paternal grandmother; Lupus in her mother; Prostate cancer in her paternal uncle; Stroke in her paternal grandfather; Thyroid  disease in her mother.  ROS:   Please see the history of present illness.     All other systems reviewed and are negative.  EKGs/Labs/Other Studies Reviewed:    The following studies were reviewed today: Ecg dated 04/02/24: NSR with [facility]. I have personally reviewed and interpreted this study.       Recent Labs: 11/14/2023: TSH 1.050 04/02/2024: Magnesium  1.8 04/04/2024: Hemoglobin 13.6; Platelets 154 04/19/2024: ALT 68; BUN 13; Creatinine, Ser 0.69; Potassium 3.9; Sodium 141  Recent Lipid Panel    Component Value Date/Time   CHOL 203 (H)  03/17/2024 1525   TRIG 143 03/17/2024 1525   HDL 59 03/17/2024 1525   CHOLHDL 3.4 03/17/2024 1525   CHOLHDL 2.9 04/17/2023 1545   VLDL 20.6 09/01/2015 0815   LDLCALC 119 (H) 03/17/2024 1525   LDLCALC 100 (H) 04/17/2023 1545     Risk Assessment/Calculations:       Physical Exam:    VS:  BP (!) 140/85 (BP Location: Left Wrist, Patient Position: Sitting, Cuff Size: Large)  Pulse 92   Ht 5' 4 (1.626 m)   Wt 277 lb 8 oz (125.9 kg)   LMP 11/02/1997   SpO2 96%   BMI 47.63 kg/m     Wt Readings from Last 3 Encounters:  05/20/24 277 lb 8 oz (125.9 kg)  04/19/24 281 lb (127.5 kg)  04/03/24 280 lb 10.3 oz (127.3 kg)     GEN:  Well nourished, morbidly obese in no acute distress HEENT: Normal NECK: No JVD; No carotid bruits LYMPHATICS: No lymphadenopathy CARDIAC: RRR, no murmurs, rubs, gallops RESPIRATORY:  Clear to auscultation without rales, wheezing or rhonchi  ABDOMEN: Soft, non-tender, non-distended MUSCULOSKELETAL:  No edema; No deformity  SKIN: Warm and dry NEUROLOGIC:  Alert and oriented x 3 PSYCHIATRIC:  Normal affect   ASSESSMENT:    1. Dyspnea on exertion   2. Obesity, morbid (HCC)   3. OSA (obstructive sleep apnea)    PLAN:    In order of problems listed above:  DOE with CT showing enlarged pulmonary artery c/w pulmonary HTN. Risk factors include morbid obesity and OSA. Also at higher risk for diastolic dysfunction. Will obtain Echo. Continue HCT for now. Add potassium supplement. Repeat CMET on return. Further adjustment in meds depending on Echo results.            Medication Adjustments/Labs and Tests Ordered: Current medicines are reviewed at length with the patient today.  Concerns regarding medicines are outlined above.  Orders Placed This Encounter  Procedures   Comp Met (CMET)   ECHOCARDIOGRAM COMPLETE   Meds ordered this encounter  Medications   hydrochlorothiazide  (HYDRODIURIL ) 50 MG tablet    Sig: Take 0.5 tablets (25 mg total) by  mouth daily.    Dispense:  90 tablet    Refill:  3   potassium chloride  SA (KLOR-CON  M20) 20 MEQ tablet    Sig: Take 1 tablet (20 mEq total) by mouth daily.    Dispense:  90 tablet    Refill:  3    There are no Patient Instructions on file for this visit.   Signed, Alarik Radu, MD  05/20/2024 9:02 AM     [facility]     [1]  Current Meds  Medication Sig   acetaminophen  (TYLENOL ) 500 MG tablet Take 1,000 mg by mouth every 6 (six) hours as needed for moderate pain (pain score 4-6).   albuterol  (VENTOLIN  HFA) 108 (90 Base) MCG/ACT inhaler Inhale 1-2 puffs into the lungs every 6 (six) hours as needed.   famotidine  (PEPCID ) 20 MG tablet Take 1 tablet (20 mg total) by mouth at bedtime. (Patient taking differently: Take 20 mg by mouth daily.)   ibuprofen (ADVIL) 200 MG tablet Take 400 mg by mouth every 6 (six) hours as needed for moderate pain (pain score 4-6).   levocetirizine (XYZAL ) 5 MG tablet Take 1 tablet (5 mg total) by mouth every morning.   montelukast  (SINGULAIR ) 10 MG tablet Take 1 tablet (10 mg total) by mouth as needed. (Patient taking differently: Take 10 mg by mouth in the morning.)   potassium chloride  SA (KLOR-CON  M20) 20 MEQ tablet Take 1 tablet (20 mEq total) by mouth daily.   venlafaxine  XR (EFFEXOR -XR) 150 MG 24 hr capsule Take 1 capsule (150 mg total) by mouth daily with breakfast.   Vitamin D , Ergocalciferol , (DRISDOL ) 1.25 MG (50000 UNIT) CAPS capsule Take 1 capsule (50,000 Units total) by mouth once a week.   [DISCONTINUED] hydrochlorothiazide  (HYDRODIURIL ) 50 MG tablet Take 1 tablet by mouth once daily

## 2024-05-20 ENCOUNTER — Ambulatory Visit: Attending: Cardiology | Admitting: Cardiology

## 2024-05-20 ENCOUNTER — Encounter: Payer: Self-pay | Admitting: Cardiology

## 2024-05-20 VITALS — BP 140/85 | HR 92 | Ht 64.0 in | Wt 277.5 lb

## 2024-05-20 DIAGNOSIS — G4733 Obstructive sleep apnea (adult) (pediatric): Secondary | ICD-10-CM | POA: Diagnosis not present

## 2024-05-20 DIAGNOSIS — R0609 Other forms of dyspnea: Secondary | ICD-10-CM

## 2024-05-20 MED ORDER — HYDROCHLOROTHIAZIDE 50 MG PO TABS: 25.0000 mg | ORAL_TABLET | Freq: Every day | ORAL | 3 refills | Status: DC

## 2024-05-20 NOTE — Patient Instructions (Signed)
 Medication Instructions:  Start Potassium 20 meq daily Continue all other medications  Lab Work: Have a cmet same day when you have echo  Testing/Procedures: Echo  Follow-Up: At [facility], you and your health needs are our priority.  As part of our continuing mission to provide you with exceptional heart care, our providers are all part of one team.  This team includes your primary Cardiologist (physician) and Advanced Practice Providers or APPs (Physician Assistants and Nurse Practitioners) who all work together to provide you with the care you need, when you need it.  Your next appointment:  After Echo    Provider:  Dr.Jordan   We recommend signing up for the patient portal called MyChart.  Sign up information is provided on this After Visit Summary.  MyChart is used to connect with patients for Virtual Visits (Telemedicine).  Patients are able to view lab/test results, encounter notes, upcoming appointments, etc.  Non-urgent messages can be sent to your provider as well.   To learn more about what you can do with MyChart, go to [email or web address].

## 2024-05-26 ENCOUNTER — Other Ambulatory Visit: Payer: Self-pay

## 2024-05-31 ENCOUNTER — Other Ambulatory Visit: Payer: Self-pay | Admitting: Nurse Practitioner

## 2024-05-31 ENCOUNTER — Other Ambulatory Visit

## 2024-05-31 ENCOUNTER — Ambulatory Visit: Admitting: Orthopaedic Surgery

## 2024-05-31 ENCOUNTER — Other Ambulatory Visit (INDEPENDENT_AMBULATORY_CARE_PROVIDER_SITE_OTHER)

## 2024-05-31 ENCOUNTER — Encounter: Payer: Self-pay | Admitting: Orthopaedic Surgery

## 2024-05-31 VITALS — Wt 281.0 lb

## 2024-05-31 DIAGNOSIS — M25561 Pain in right knee: Secondary | ICD-10-CM

## 2024-05-31 DIAGNOSIS — G8929 Other chronic pain: Secondary | ICD-10-CM

## 2024-05-31 DIAGNOSIS — M25562 Pain in left knee: Secondary | ICD-10-CM

## 2024-05-31 DIAGNOSIS — E041 Nontoxic single thyroid nodule: Secondary | ICD-10-CM

## 2024-05-31 MED ORDER — PHENTERMINE HCL 37.5 MG PO TABS: 37.5000 mg | ORAL_TABLET | Freq: Every day | ORAL | 0 refills | Status: AC

## 2024-05-31 NOTE — Progress Notes (Signed)
 The patient is a 53 year old healthy female who comes in today with chronic knee pain with both her knees and the right worse than left has been going on for several years now.  She does feel a lot of cracking and popping in her knees especially with going up and down stairs.  Her mother has a history of having both her knees replaced and 1 of those knees had to be revised.  She has never had surgery on her knees and never had any type of injections.  Her main comorbidity relates to her weight.  Her BMI is 48.23.  However she does not have a large soft tissue envelope around her knees.  She has tried activity modification and knows about weight loss.  She has not had any type of outpatient physical therapy either.  I was able to review her medications and past medical history within epic.  On exam neither knee has a large soft tissue envelope at all.  Both knees slightly hyperextend but have neutral alignment.  Both knees have full range of motion and her patellofemoral crepitation is quite significant with both knees.  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  The medial lateral compartments of both knees are well-maintained and the knee alignment is neutral with both knees.  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  Activity modification and quad training exercises will help as well as weight loss.  We talked about the potential for hyaluronic acid at some point and even trying medication such as turmeric and glucosamine.  I did show her some quad strengthening exercises that she can try on her own and she is astute for someone who can look this up online and looking at other exercises to try.  All questions and concerns were answered addressed.  This did give her some reassurance at least that she is not anywhere near needing knee replacement surgery.  If things worsen or she would like to consider injections she  will reach out to us .

## 2024-06-02 ENCOUNTER — Ambulatory Visit: Payer: Self-pay | Admitting: Cardiology

## 2024-06-02 ENCOUNTER — Ambulatory Visit (HOSPITAL_COMMUNITY): Admission: RE | Admit: 2024-06-02 | Discharge: 2024-06-02 | Attending: Cardiology | Admitting: Cardiology

## 2024-06-02 DIAGNOSIS — R0609 Other forms of dyspnea: Secondary | ICD-10-CM | POA: Insufficient documentation

## 2024-06-02 LAB — COMPREHENSIVE METABOLIC PANEL WITH GFR
ALT: 59 IU/L — ABNORMAL HIGH (ref 0–32)
AST: 54 IU/L — ABNORMAL HIGH (ref 0–40)
Albumin: 3.9 g/dL (ref 3.8–4.9)
Alkaline Phosphatase: 116 IU/L (ref 49–135)
BUN/Creatinine Ratio: 14 (ref 9–23)
BUN: 11 mg/dL (ref 6–24)
Bilirubin Total: 0.3 mg/dL (ref 0.0–1.2)
CO2: 24 mmol/L (ref 20–29)
Calcium: 9.1 mg/dL (ref 8.7–10.2)
Chloride: 101 mmol/L (ref 96–106)
Creatinine, Ser: 0.81 mg/dL (ref 0.57–1.00)
Globulin, Total: 2.2 g/dL (ref 1.5–4.5)
Glucose: 96 mg/dL (ref 70–99)
Potassium: 3.8 mmol/L (ref 3.5–5.2)
Sodium: 140 mmol/L (ref 134–144)
Total Protein: 6.1 g/dL (ref 6.0–8.5)
eGFR: 87 mL/min/1.73

## 2024-06-02 LAB — ECHOCARDIOGRAM COMPLETE
Area-P 1/2: 3.89 cm2
S' Lateral: 2.86 cm

## 2024-06-09 ENCOUNTER — Other Ambulatory Visit: Payer: Self-pay | Admitting: Family

## 2024-06-09 ENCOUNTER — Other Ambulatory Visit: Payer: Self-pay | Admitting: Internal Medicine

## 2024-06-10 NOTE — Telephone Encounter (Signed)
 Requested Prescriptions  Refused Prescriptions Disp Refills   montelukast  (SINGULAIR ) 10 MG tablet [Pharmacy Med Name: Montelukast  Sodium 10 MG Oral Tablet] 90 tablet 0    Sig: TAKE 1 TABLET BY MOUTH AS NEEDED     Pulmonology:  Leukotriene Inhibitors Passed - 06/10/2024 12:07 PM      Passed - Valid encounter within last 12 months    Recent Outpatient Visits           11 months ago Class 3 severe obesity due to excess calories with serious comorbidity and body mass index (BMI) of 45.0 to 49.9 in adult [facility])   [facility] [place], [place], [place]

## 2024-06-15 ENCOUNTER — Other Ambulatory Visit: Payer: Self-pay | Admitting: Family

## 2024-07-01 ENCOUNTER — Ambulatory Visit (HOSPITAL_COMMUNITY)
